# Patient Record
Sex: Male | Born: 1940 | Race: White | Hispanic: No | Marital: Married | Smoking: Former smoker
Health system: Southern US, Community
[De-identification: ages and names within clinical notes are randomized; demographics above are authoritative.]

## PROBLEM LIST (undated history)

## (undated) DIAGNOSIS — F419 Anxiety disorder, unspecified: Secondary | ICD-10-CM

## (undated) DIAGNOSIS — I10 Essential (primary) hypertension: Secondary | ICD-10-CM

## (undated) DIAGNOSIS — M1990 Unspecified osteoarthritis, unspecified site: Secondary | ICD-10-CM

## (undated) DIAGNOSIS — E781 Pure hyperglyceridemia: Secondary | ICD-10-CM

## (undated) DIAGNOSIS — I499 Cardiac arrhythmia, unspecified: Secondary | ICD-10-CM

## (undated) DIAGNOSIS — D45 Polycythemia vera: Secondary | ICD-10-CM

## (undated) DIAGNOSIS — C61 Malignant neoplasm of prostate: Secondary | ICD-10-CM

## (undated) DIAGNOSIS — F32A Depression, unspecified: Secondary | ICD-10-CM

## (undated) DIAGNOSIS — F329 Major depressive disorder, single episode, unspecified: Secondary | ICD-10-CM

## (undated) DIAGNOSIS — I219 Acute myocardial infarction, unspecified: Secondary | ICD-10-CM

## (undated) DIAGNOSIS — C801 Malignant (primary) neoplasm, unspecified: Secondary | ICD-10-CM

## (undated) HISTORY — DX: Unspecified osteoarthritis, unspecified site: M19.90

## (undated) HISTORY — DX: Acute myocardial infarction, unspecified: I21.9

## (undated) HISTORY — DX: Anxiety disorder, unspecified: F41.9

## (undated) HISTORY — PX: HERNIA REPAIR: SHX51

## (undated) HISTORY — DX: Malignant neoplasm of prostate: C61

## (undated) HISTORY — PX: PROSTATECTOMY: SHX69

---
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
## 1998-01-04 ENCOUNTER — Emergency Department (HOSPITAL_COMMUNITY): Admission: EM | Admit: 1998-01-04 | Discharge: 1998-01-04 | Payer: Self-pay | Admitting: Internal Medicine

## 1998-01-20 ENCOUNTER — Emergency Department (HOSPITAL_COMMUNITY): Admission: EM | Admit: 1998-01-20 | Discharge: 1998-01-20 | Payer: Self-pay | Admitting: Emergency Medicine

## 1998-01-31 ENCOUNTER — Emergency Department (HOSPITAL_COMMUNITY): Admission: EM | Admit: 1998-01-31 | Discharge: 1998-01-31 | Payer: Self-pay

## 2005-04-04 ENCOUNTER — Encounter: Admission: RE | Admit: 2005-04-04 | Discharge: 2005-04-04 | Payer: Self-pay | Admitting: Urology

## 2005-06-12 ENCOUNTER — Inpatient Hospital Stay (HOSPITAL_COMMUNITY): Admission: RE | Admit: 2005-06-12 | Discharge: 2005-06-14 | Payer: Self-pay | Admitting: Urology

## 2016-09-16 ENCOUNTER — Ambulatory Visit (INDEPENDENT_AMBULATORY_CARE_PROVIDER_SITE_OTHER): Payer: 59 | Admitting: Otolaryngology

## 2016-09-16 DIAGNOSIS — D3701 Neoplasm of uncertain behavior of lip: Secondary | ICD-10-CM

## 2017-04-26 DIAGNOSIS — R972 Elevated prostate specific antigen [PSA]: Secondary | ICD-10-CM | POA: Insufficient documentation

## 2019-06-18 ENCOUNTER — Emergency Department (HOSPITAL_COMMUNITY): Payer: Medicare Other

## 2019-06-18 ENCOUNTER — Other Ambulatory Visit: Payer: Self-pay

## 2019-06-18 ENCOUNTER — Encounter (HOSPITAL_COMMUNITY): Payer: Self-pay | Admitting: Emergency Medicine

## 2019-06-18 ENCOUNTER — Emergency Department (HOSPITAL_COMMUNITY)
Admission: EM | Admit: 2019-06-18 | Discharge: 2019-06-18 | Disposition: A | Discharge status: Home / Self Care | Payer: Medicare Other | Attending: Emergency Medicine | Admitting: Emergency Medicine

## 2019-06-18 DIAGNOSIS — Z79899 Other long term (current) drug therapy: Secondary | ICD-10-CM | POA: Diagnosis not present

## 2019-06-18 DIAGNOSIS — Z87891 Personal history of nicotine dependence: Secondary | ICD-10-CM | POA: Diagnosis not present

## 2019-06-18 DIAGNOSIS — I10 Essential (primary) hypertension: Secondary | ICD-10-CM | POA: Insufficient documentation

## 2019-06-18 DIAGNOSIS — Z7982 Long term (current) use of aspirin: Secondary | ICD-10-CM | POA: Diagnosis not present

## 2019-06-18 DIAGNOSIS — R040 Epistaxis: Secondary | ICD-10-CM | POA: Insufficient documentation

## 2019-06-18 DIAGNOSIS — I4891 Unspecified atrial fibrillation: Secondary | ICD-10-CM | POA: Diagnosis not present

## 2019-06-18 HISTORY — DX: Essential (primary) hypertension: I10

## 2019-06-18 HISTORY — DX: Depression, unspecified: F32.A

## 2019-06-18 HISTORY — DX: Malignant (primary) neoplasm, unspecified: C80.1

## 2019-06-18 HISTORY — DX: Pure hyperglyceridemia: E78.1

## 2019-06-18 LAB — PROTIME-INR
INR: 1.1 (ref 0.8–1.2)
Prothrombin Time: 14.2 seconds (ref 11.4–15.2)

## 2019-06-18 LAB — CBC WITH DIFFERENTIAL/PLATELET
Abs Immature Granulocytes: 0.06 10*3/uL (ref 0.00–0.07)
Basophils Absolute: 0.1 10*3/uL (ref 0.0–0.1)
Basophils Relative: 1 %
Eosinophils Absolute: 0.4 10*3/uL (ref 0.0–0.5)
Eosinophils Relative: 3 %
HCT: 45.9 % (ref 39.0–52.0)
Hemoglobin: 15.1 g/dL (ref 13.0–17.0)
Immature Granulocytes: 1 %
Lymphocytes Relative: 14 %
Lymphs Abs: 1.8 10*3/uL (ref 0.7–4.0)
MCH: 29.6 pg (ref 26.0–34.0)
MCHC: 32.9 g/dL (ref 30.0–36.0)
MCV: 90 fL (ref 80.0–100.0)
Monocytes Absolute: 0.9 10*3/uL (ref 0.1–1.0)
Monocytes Relative: 7 %
Neutro Abs: 9.6 10*3/uL — ABNORMAL HIGH (ref 1.7–7.7)
Neutrophils Relative %: 74 %
Platelets: 615 10*3/uL — ABNORMAL HIGH (ref 150–400)
RBC: 5.1 MIL/uL (ref 4.22–5.81)
RDW: 13.6 % (ref 11.5–15.5)
WBC: 12.8 10*3/uL — ABNORMAL HIGH (ref 4.0–10.5)
nRBC: 0 % (ref 0.0–0.2)

## 2019-06-18 LAB — BASIC METABOLIC PANEL
Anion gap: 7 (ref 5–15)
BUN: 27 mg/dL — ABNORMAL HIGH (ref 8–23)
CO2: 27 mmol/L (ref 22–32)
Calcium: 8.6 mg/dL — ABNORMAL LOW (ref 8.9–10.3)
Chloride: 103 mmol/L (ref 98–111)
Creatinine, Ser: 0.99 mg/dL (ref 0.61–1.24)
GFR calc Af Amer: >60 mL/min (ref >60)
GFR calc non Af Amer: >60 mL/min (ref >60)
Glucose, Bld: 104 mg/dL — ABNORMAL HIGH (ref 70–99)
Potassium: 3.8 mmol/L (ref 3.5–5.1)
Sodium: 137 mmol/L (ref 135–145)

## 2019-06-18 LAB — TROPONIN I (HIGH SENSITIVITY)
Troponin I (High Sensitivity): 4 ng/L (ref <18)
Troponin I (High Sensitivity): 5 ng/L (ref <18)

## 2019-06-18 MED ORDER — OXYMETAZOLINE HCL 0.05 % NA SOLN: 1.0000 | Freq: Once | NASAL | Status: DC

## 2019-06-18 MED ORDER — OXYMETAZOLINE HCL 0.05 % NA SOLN
1.0000 | Freq: Once | NASAL | Status: AC
  Administered 2019-06-18: 1 via NASAL
  Filled 2019-06-18: qty 30

## 2019-06-18 NOTE — Discharge Instructions (Addendum)
Continue taking her blood pressure medication and aspirin as directed.  You will need to contact the cardiologist listed to arrange follow-up regarding your atrial fibrillation.  You may use 1 to 2 sprays of the Afrin to each nostril twice daily for 3 days.  You may apply firm pressure to your nose by pinching and holding for at least 10 to 15 minutes if the bleeding returns.  If you are not unable to stop the bleeding using this method, return to the ER.

## 2019-06-18 NOTE — ED Provider Notes (Signed)
  Face-to-face evaluation   History: He complains of intermittent nosebleeding for several days.  History of same in the past.  Is also had some palpitations recently.  He denies dizziness, syncope, chest pain focal weakness or paresthesia.  Physical exam: Alert, cooperative.  Heart rate normal.  He is lucid.  No respiratory distress.  No current nasal bleeding, at 10:02 PM.   Medical screening examination/treatment/procedure(s) were conducted as a shared visit with non-physician practitioner(s) and myself.  I personally evaluated the patient during the encounter    Daleen Bo, MD 06/21/19 [phone]

## 2019-06-18 NOTE — ED Provider Notes (Signed)
[facility] EMERGENCY DEPARTMENT Provider Note   CSN: [id number] Arrival date & time: 06/18/19  1115     History   Chief Complaint Chief Complaint  Patient presents with  . Epistaxis    HPI Justin Gibbs is a 78 y.o. male.     HPI   Justin Gibbs is a 78 y.o. male with past medical history of prostate cancer and hypertension who presents to the Emergency Department complaining of intermittent episodes of left-sided epistaxis.  He states that he woke 1 week ago at 5 AM with bleeding from his left nostril.  He reports a large amount of blood persisting for 1 hour, but resolved after direct pressure with pinching his nose.  He had a similar episode the following day that also stopped with direct pressure and then again today at 930 this morning. No bleeding at present.  He also complains of intermittent episodes of "feeling like my heart is skipping" states he has had similar episodes in the past, but has not been elevated by cardiology. He denies shortness of breath, chest pain, headache, dizziness and syncope.     Past Medical History:  Diagnosis Date  . Cancer [facility])    prostate  . Depression   . High triglycerides   . Hypertension     There are no active problems to display for this patient.   Past Surgical History:  Procedure Laterality Date  . HERNIA REPAIR    . PROSTATECTOMY          Home Medications    Prior to Admission medications   Medication Sig Start Date End Date Taking? Authorizing Provider  aspirin EC 81 MG tablet Take 1 tablet by mouth daily.   Yes [provider]  lisinopril-hydrochlorothiazide (ZESTORETIC) 20-12.5 MG tablet Take 1 tablet by mouth daily. 03/09/19  Yes [provider]  metoprolol tartrate (LOPRESSOR) 50 MG tablet Take 50 mg by mouth 2 (two) times daily. 05/27/19  Yes [provider]  PARoxetine (PAXIL) 10 MG tablet Take 1 tablet by mouth daily. 05/24/19  Yes [provider]    Family  History History reviewed. No pertinent family history.  Social History Social History   Tobacco Use  . Smoking status: Former Research scientist (life sciences)  . Smokeless tobacco: Never Used  Substance Use Topics  . Alcohol use: Never    Frequency: Never  . Drug use: Never     Allergies   Patient has no known allergies.   Review of Systems Review of Systems  Constitutional: Negative for appetite change and fever.  HENT: Positive for nosebleeds. Negative for trouble swallowing.   Eyes: Negative for visual disturbance.  Respiratory: Negative for cough, chest tightness and shortness of breath.   Cardiovascular: Positive for palpitations. Negative for chest pain.  Gastrointestinal: Negative for abdominal pain, nausea and vomiting.  Musculoskeletal: Negative for arthralgias, back pain and neck pain.  Skin: Negative for rash.  Neurological: Negative for dizziness, syncope, weakness, numbness and headaches.  Hematological: Negative for adenopathy.     Physical Exam Updated Vital Signs BP (!) 153/91 (BP Location: Left Arm)   Pulse 80   Temp 98.4 F (36.9 C) (Oral)   Resp 18   Ht 5\' 10"  (1.778 m)   Wt 95.3 kg   SpO2 95%   BMI 30.13 kg/m   Physical Exam Vitals signs and nursing note reviewed.  Constitutional:      Appearance: Normal appearance. He is not ill-appearing.  HENT:     Nose:  Comments: Dried blood to the left nostril.  No active bleeding    Mouth/Throat:     Mouth: Mucous membranes are moist.     Pharynx: Oropharynx is clear.  Neck:     Musculoskeletal: Normal range of motion. No muscular tenderness.  Cardiovascular:     Rate and Rhythm: Normal rate. Rhythm irregular.     Pulses: Normal pulses.  Pulmonary:     Effort: Pulmonary effort is normal. No respiratory distress.     Breath sounds: Normal breath sounds.  Abdominal:     General: There is no distension.     Palpations: Abdomen is soft.     Tenderness: There is no abdominal tenderness.  Musculoskeletal: Normal  range of motion.     Right lower leg: No edema.     Left lower leg: No edema.  Lymphadenopathy:     Cervical: No cervical adenopathy.  Skin:    General: Skin is warm.     Capillary Refill: Capillary refill takes less than 2 seconds.     Findings: No rash.  Neurological:     General: No focal deficit present.     Mental Status: He is alert.     Sensory: No sensory deficit.     Motor: No weakness.      ED Treatments / Results  Labs (all labs ordered are listed, but only abnormal results are displayed) Labs Reviewed  BASIC METABOLIC PANEL - Abnormal; Notable for the following components:      Result Value   Glucose, Bld 104 (*)    BUN 27 (*)    Calcium 8.6 (*)    All other components within normal limits  CBC WITH DIFFERENTIAL/PLATELET - Abnormal; Notable for the following components:   WBC 12.8 (*)    Platelets 615 (*)    Neutro Abs 9.6 (*)    All other components within normal limits  PROTIME-INR  TROPONIN I (HIGH SENSITIVITY)  TROPONIN I (HIGH SENSITIVITY)    EKG EKG Interpretation  Date/Time:  Friday June 18 2019 18:11:43 EST Ventricular Rate:  69 PR Interval:    QRS Duration: 92 QT Interval:  540 QTC Calculation: 579 R Axis:   64 Text Interpretation: Atrial fibrillation Borderline repolarization abnormality Prolonged QT interval Baseline wander in lead(s) II III aVR aVF Since last tracing Atrial fibrillation has occurred and QT is longer Confirmed by Daleen Bo [phone]) on 06/19/2019 1:53:04 AM   Radiology Dg Chest Portable 1 View  Result Date: 06/18/2019 CLINICAL DATA:  Per triage Note: Patient reports 3 nosebleeds in the past few days. Started this am at 09:30. Patient states he had bleeding for approximately an hour this am. No bleeding at present. EXAM: PORTABLE CHEST 1 VIEW COMPARISON:  06/10/2005 FINDINGS: Cardiac silhouette is normal in size. No mediastinal or hilar masses. Clear lungs.  No pleural effusion or pneumothorax. Skeletal structures  are grossly intact. IMPRESSION: No active disease. Electronically Signed   By: Lajean Manes M.D.   On: 06/18/2019 19:33    Procedures Procedures (including critical care time)  Medications Ordered in ED Medications  oxymetazoline (AFRIN) 0.05 % nasal spray 1 spray (1 spray Each Nare Given 06/18/19 2106)     Initial Impression / Assessment and Plan / ED Course  I have reviewed the triage vital signs and the nursing notes.  Pertinent labs & imaging results that were available during my care of the patient were reviewed by me and considered in my medical decision making (see chart for details).  CHADS-VASc score is 3   Patient also seen by Dr. Eulis Foster and care plan discussed.  Pt with atrial fib. No RVR.  Episodic epistaxis previously w/o active bleeding. Afrin applied.  Pt has been observed here without complications.  Seen by Dr. Eulis Foster and care plan discussed.    Final Clinical Impressions(s) / ED Diagnoses   Final diagnoses:  Left-sided epistaxis  Atrial fibrillation, unspecified type [facility])    ED Discharge Orders    None       Kem Parkinson, PA-C 06/20/19 1339    Daleen Bo, MD 06/21/19 [id number]    Daleen Bo, MD 06/21/19 [phone]

## 2019-06-18 NOTE — ED Notes (Signed)
No active bleeding to nose at this time

## 2019-06-18 NOTE — ED Triage Notes (Signed)
Patient reports 3 nosebleeds in the past few days. Started this am at 09:30. Patient states he had bleeding for approximately an hour this am. No bleeding at present.

## 2019-06-18 NOTE — ED Notes (Signed)
Pt reports he has had some palpations and felt like his heart was racing in the past, radial pulse palpated and is irregular.

## 2019-07-02 ENCOUNTER — Other Ambulatory Visit: Payer: Self-pay

## 2019-07-05 ENCOUNTER — Encounter: Payer: Self-pay | Admitting: Family Medicine

## 2019-07-05 ENCOUNTER — Ambulatory Visit (INDEPENDENT_AMBULATORY_CARE_PROVIDER_SITE_OTHER): Payer: Medicare Other | Admitting: Family Medicine

## 2019-07-05 ENCOUNTER — Other Ambulatory Visit: Payer: Self-pay

## 2019-07-05 VITALS — BP 145/87 | HR 76 | Temp 98.0°F | Resp 20 | Ht 70.0 in | Wt 226.0 lb

## 2019-07-05 DIAGNOSIS — R9431 Abnormal electrocardiogram [ECG] [EKG]: Secondary | ICD-10-CM | POA: Insufficient documentation

## 2019-07-05 DIAGNOSIS — Z6832 Body mass index (BMI) 32.0-32.9, adult: Secondary | ICD-10-CM | POA: Insufficient documentation

## 2019-07-05 DIAGNOSIS — F411 Generalized anxiety disorder: Secondary | ICD-10-CM | POA: Insufficient documentation

## 2019-07-05 DIAGNOSIS — R0600 Dyspnea, unspecified: Secondary | ICD-10-CM

## 2019-07-05 DIAGNOSIS — I480 Paroxysmal atrial fibrillation: Secondary | ICD-10-CM | POA: Diagnosis not present

## 2019-07-05 DIAGNOSIS — R5383 Other fatigue: Secondary | ICD-10-CM

## 2019-07-05 DIAGNOSIS — Z23 Encounter for immunization: Secondary | ICD-10-CM

## 2019-07-05 DIAGNOSIS — Z8546 Personal history of malignant neoplasm of prostate: Secondary | ICD-10-CM | POA: Insufficient documentation

## 2019-07-05 DIAGNOSIS — E782 Mixed hyperlipidemia: Secondary | ICD-10-CM | POA: Insufficient documentation

## 2019-07-05 DIAGNOSIS — J329 Chronic sinusitis, unspecified: Secondary | ICD-10-CM | POA: Insufficient documentation

## 2019-07-05 DIAGNOSIS — R0609 Other forms of dyspnea: Secondary | ICD-10-CM

## 2019-07-05 NOTE — Progress Notes (Addendum)
Subjective:  Patient ID: Justin Gibbs, male    DOB: 15-Aug-1940, 78 y.o.   MRN: [id number]  Patient Care Team: Baruch Gouty, FNP as PCP - General (Family Medicine)   Chief Complaint:  Establish Care (New - from Dr Scotty Court in [place])   HPI: Justin Gibbs is a 78 y.o. male presenting on 07/05/2019 for Establish Care (New - from Dr Scotty Court in [place])   Pt presents today to establish care with a new PCP. He was seeing Dr. Scotty Court in [place], [place], who has retired. Pt does have a history of prostate cancer and underwent a radical prostatectomy in 2006 and is followed by urology on a regular basis. His PSA is checked every 3 months and he is seen by his urologist every 6 months. He reports he has been doing very well since his prostatectomy. He denies LUTS.  He was recently started on atorvastatin and developed side effects so it was stopped. He then started fish oil and developed recurrent nose bleeds, so he stopped this as well. States he was seen in the ED at [facility] ED on 06/18/2019 due to epistaxis. Pt states at this time he had an EKG that was abnormal. Pt states he has been told in the past he had an irregular heart beat at times but this was attributed to his anxiety. States he last saw cardiology around 1998. He does take ASA on a daily basis but has never been on any other blood thinners. His EKG in the ED revealed A Fib at a controlled rate with a prolonged QT interval. CXR was normal. Imaging and lab results available in EHR for review. Pt states he does have fatigue and DOE. He denies CP, resting SHOB, PND, or orthopnea. No lower extremity swelling or weight changes.  He reports he has had anxiety for a long time. States he was placed on paroxetine many years ago and has tolerated it well. States he tried to stop this at one time and was unable to. He denies recent panic, SI, or HI. No insomnia.   Relevant past medical, surgical, family, and social history reviewed and updated as  indicated.  Allergies and medications reviewed and updated. Date reviewed: Chart in Epic.   Past Medical History:  Diagnosis Date  . Cancer [facility])    prostate / removed 2006  . Depression   . High triglycerides   . Hypertension     Past Surgical History:  Procedure Laterality Date  . HERNIA REPAIR Bilateral    x 2   . PROSTATECTOMY      Social History   Socioeconomic History  . Marital status: Married    Spouse name: Dawn  . Number of children: 3  . Years of education: Not on file  . Highest education level: Not on file  Occupational History  . Occupation: retired     Comment: 2007  Social Needs  . Financial resource strain: Not on file  . Food insecurity    Worry: Not on file    Inability: Not on file  . Transportation needs    Medical: Not on file    Non-medical: Not on file  Tobacco Use  . Smoking status: Former Research scientist (life sciences)  . Smokeless tobacco: Never Used  Substance and Sexual Activity  . Alcohol use: Never    Frequency: Never  . Drug use: Never  . Sexual activity: Not on file  Lifestyle  . Physical  activity    Days per week: Not on file    Minutes per session: Not on file  . Stress: Not on file  Relationships  . Social Herbalist on phone: Not on file    Gets together: Not on file    Attends religious service: Not on file    Active member of club or organization: Not on file    Attends meetings of clubs or organizations: Not on file    Relationship status: Not on file  . Intimate partner violence    Fear of current or ex partner: Not on file    Emotionally abused: Not on file    Physically abused: Not on file    Forced sexual activity: Not on file  Other Topics Concern  . Not on file  Social History Narrative  . Not on file    Outpatient Encounter Medications as of 07/05/2019  Medication Sig  . aspirin EC 81 MG tablet Take 1 tablet by mouth daily.  Marland Kitchen lisinopril-hydrochlorothiazide (ZESTORETIC) 20-12.5 MG tablet Take 1 tablet by mouth  daily.  . metoprolol tartrate (LOPRESSOR) 50 MG tablet Take 50 mg by mouth 2 (two) times daily.  Marland Kitchen PARoxetine (PAXIL) 10 MG tablet Take 1 tablet by mouth daily.   No facility-administered encounter medications on file as of 07/05/2019.     No Known Allergies  Review of Systems  Constitutional: Positive for fatigue. Negative for activity change, appetite change, chills, diaphoresis, fever and unexpected weight change.  HENT: Positive for hearing loss.   Eyes: Negative.  Negative for photophobia and visual disturbance.  Respiratory: Positive for shortness of breath. Negative for cough and chest tightness.   Cardiovascular: Positive for palpitations. Negative for chest pain and leg swelling.  Gastrointestinal: Negative for abdominal distention, abdominal pain, anal bleeding, blood in stool, constipation, diarrhea, nausea, rectal pain and vomiting.  Endocrine: Negative.  Negative for cold intolerance, heat intolerance, polydipsia, polyphagia and polyuria.  Genitourinary: Negative for decreased urine volume, difficulty urinating, discharge, dysuria, frequency, penile pain, penile swelling, scrotal swelling, testicular pain and urgency.  Musculoskeletal: Negative for arthralgias, back pain and myalgias.  Skin: Negative.   Allergic/Immunologic: Negative.   Neurological: Negative for dizziness, tremors, seizures, syncope, facial asymmetry, speech difficulty, weakness, light-headedness, numbness and headaches.  Hematological: Negative.  Does not bruise/bleed easily.  Psychiatric/Behavioral: Negative for agitation, behavioral problems, confusion, decreased concentration, dysphoric mood, hallucinations, self-injury, sleep disturbance and suicidal ideas. The patient is nervous/anxious. The patient is not hyperactive.   All other systems reviewed and are negative.       Objective:  BP (!) 145/87 (BP Location: Left Arm, Cuff Size: Large)   Pulse 76   Temp 98 F (36.7 C)   Resp 20   Ht 5' 10"   (1.778 m)   Wt 226 lb (102.5 kg)   SpO2 97%   BMI 32.43 kg/m    Wt Readings from Last 3 Encounters:  07/05/19 226 lb (102.5 kg)  06/18/19 210 lb (95.3 kg)    Physical Exam Vitals signs and nursing note reviewed.  Constitutional:      General: He is not in acute distress.    Appearance: Normal appearance. He is well-developed and well-groomed. He is obese. He is not ill-appearing, toxic-appearing or diaphoretic.  HENT:     Head: Normocephalic and atraumatic.     Jaw: There is normal jaw occlusion.     Right Ear: Decreased hearing noted.     Left Ear: Decreased hearing noted.  Ears:     Comments: Bilateral hearing aids    Nose: Nose normal.     Mouth/Throat:     Lips: Pink.     Mouth: Mucous membranes are moist.     Pharynx: Oropharynx is clear. Uvula midline.  Eyes:     General: Lids are normal.     Extraocular Movements: Extraocular movements intact.     Conjunctiva/sclera: Conjunctivae normal.     Pupils: Pupils are equal, round, and reactive to light.  Neck:     Musculoskeletal: Normal range of motion and neck supple.     Thyroid: No thyroid mass, thyromegaly or thyroid tenderness.     Vascular: No carotid bruit or JVD.     Trachea: Trachea and phonation normal.  Cardiovascular:     Rate and Rhythm: Normal rate. Rhythm regularly irregular.     Chest Wall: PMI is not displaced.     Pulses: Normal pulses.     Heart sounds: Normal heart sounds. No murmur. No friction rub. No gallop. No S3 sounds.   Pulmonary:     Effort: Pulmonary effort is normal. No respiratory distress.     Breath sounds: Normal breath sounds. No stridor. No wheezing, rhonchi or rales.  Abdominal:     General: Abdomen is protuberant. Bowel sounds are normal. There is no distension or abdominal bruit.     Palpations: Abdomen is soft. There is no hepatomegaly or splenomegaly.     Tenderness: There is no abdominal tenderness. There is no right CVA tenderness or left CVA tenderness.     Hernia: No  hernia is present.  Musculoskeletal: Normal range of motion.     Right lower leg: No edema.     Left lower leg: No edema.  Lymphadenopathy:     Cervical: No cervical adenopathy.  Skin:    General: Skin is warm and dry.     Capillary Refill: Capillary refill takes less than 2 seconds.     Coloration: Skin is not cyanotic, jaundiced or pale.     Findings: No rash.  Neurological:     General: No focal deficit present.     Mental Status: He is alert and oriented to person, place, and time.     Cranial Nerves: Cranial nerves are intact. No cranial nerve deficit.     Sensory: Sensation is intact. No sensory deficit.     Motor: Motor function is intact. No weakness.     Coordination: Coordination is intact. Coordination normal.     Gait: Gait is intact. Gait normal.     Deep Tendon Reflexes: Reflexes are normal and symmetric. Reflexes normal.  Psychiatric:        Attention and Perception: Attention and perception normal.        Mood and Affect: Mood and affect normal.        Speech: Speech normal.        Behavior: Behavior normal. Behavior is cooperative.        Thought Content: Thought content normal.        Cognition and Memory: Cognition and memory normal.        Judgment: Judgment normal.     Results for orders placed or performed during the hospital encounter of 40/98/11  Basic metabolic panel  Result Value Ref Range   Sodium 137 135 - 145 mmol/L   Potassium 3.8 3.5 - 5.1 mmol/L   Chloride 103 98 - 111 mmol/L   CO2 27 22 - 32 mmol/L   Glucose, Bld 104 (  H) 70 - 99 mg/dL   BUN 27 (H) 8 - 23 mg/dL   Creatinine, Ser 0.99 0.61 - 1.24 mg/dL   Calcium 8.6 (L) 8.9 - 10.3 mg/dL   GFR calc non Af Amer >60 >60 mL/min   GFR calc Af Amer >60 >60 mL/min   Anion gap 7 5 - 15  CBC with Differential  Result Value Ref Range   WBC 12.8 (H) 4.0 - 10.5 K/uL   RBC 5.10 4.22 - 5.81 MIL/uL   Hemoglobin 15.1 13.0 - 17.0 g/dL   HCT 45.9 39.0 - 52.0 %   MCV 90.0 80.0 - 100.0 fL   MCH 29.6 26.0  - 34.0 pg   MCHC 32.9 30.0 - 36.0 g/dL   RDW 13.6 11.5 - 15.5 %   Platelets 615 (H) 150 - 400 K/uL   nRBC 0.0 0.0 - 0.2 %   Neutrophils Relative % 74 %   Neutro Abs 9.6 (H) 1.7 - 7.7 K/uL   Lymphocytes Relative 14 %   Lymphs Abs 1.8 0.7 - 4.0 K/uL   Monocytes Relative 7 %   Monocytes Absolute 0.9 0.1 - 1.0 K/uL   Eosinophils Relative 3 %   Eosinophils Absolute 0.4 0.0 - 0.5 K/uL   Basophils Relative 1 %   Basophils Absolute 0.1 0.0 - 0.1 K/uL   Immature Granulocytes 1 %   Abs Immature Granulocytes 0.06 0.00 - 0.07 K/uL  Protime-INR  Result Value Ref Range   Prothrombin Time 14.2 11.4 - 15.2 seconds   INR 1.1 0.8 - 1.2  Troponin I (High Sensitivity)  Result Value Ref Range   Troponin I (High Sensitivity) 4 <18 ng/L  Troponin I (High Sensitivity)  Result Value Ref Range   Troponin I (High Sensitivity) 5 <18 ng/L       Pertinent labs & imaging results that were available during my care of the patient were reviewed by me and considered in my medical decision making.  Assessment & Plan:  [place] was seen today for establish care.  Diagnoses and all orders for this visit:  History of prostate cancer Followed by urology on a regular basis. Has upcoming appointment in January.  -     CBC with Differential/Platelet  Paroxysmal atrial fibrillation ([place]) Recent EKG in ED revealed A Fib at a controlled rate. No previous EKG available for review in EHR. Pt unsure of prior A Fib diagnosis. Was seen by cardiology around 1998 and has not followed up since. On ASA daily. Recent recurrent epistaxis so will not initiate additional anticoagulation, will refer to cardiology for evaluation.  -     CMP14+EGFR -     Thyroid Panel With TSH -     Ambulatory referral to Cardiology  Mixed hyperlipidemia Unable to tolerate fish oil or statin therapy. Will initiate Red Yeast Rice, diet, and exercise. Labs pending.  -     Lipid panel -     Ambulatory referral to Cardiology  BMI 32.0-32.9,adult  Diet and exercise encouraged. Labs pending.  -     CMP14+EGFR -     CBC with Differential/Platelet -     Lipid panel -     Thyroid Panel With TSH  GAD (generalized anxiety disorder) Doing well on current therapy, will continue. Labs pending.  -     Thyroid Panel With TSH  DOE (dyspnea on exertion) CXR in ED normal, EKG in ED revealed A Fib. Will refer to cardiology for evaluation.  -     Ambulatory referral  to Cardiology  Other fatigue Labs pending.  -     CMP14+EGFR -     CBC with Differential/Platelet -     Thyroid Panel With TSH -     Ambulatory referral to Cardiology  Prolonged Q-T interval on ECG -     Ambulatory referral to Cardiology  PNA 13 vaccine given today.    Total time spent with patient 60 minutes.  Greater than 50% of encounter spent in coordination of care/counseling.   Continue all other maintenance medications.  Follow up plan: Return if symptoms worsen or fail to improve.  Continue healthy lifestyle choices, including diet (rich in fruits, vegetables, and lean proteins, and low in salt and simple carbohydrates) and exercise (at least 30 minutes of moderate physical activity daily).  Educational handout given for A Fib  The above assessment and management plan was discussed with the patient. The patient verbalized understanding of and has agreed to the management plan. Patient is aware to call the clinic if they develop any new symptoms or if symptoms persist or worsen. Patient is aware when to return to the clinic for a follow-up visit. Patient educated on when it is appropriate to go to the emergency department.   Monia Pouch, FNP-C [facility] [phone]

## 2019-07-05 NOTE — Patient Instructions (Addendum)
Red Yeast Rice 2400 mg daily, over the counter.   Atrial Fibrillation  Atrial fibrillation is a type of heartbeat that is irregular or fast (rapid). If you have this condition, your heart beats without any order. This makes it hard for your heart to pump blood in a normal way. Having this condition gives you more risk for stroke, heart failure, and other heart problems. Atrial fibrillation may start all of a sudden and then stop on its own, or it may become a long-lasting problem. What are the causes? This condition may be caused by heart conditions, such as:  High blood pressure.  Heart failure.  Heart valve disease.  Heart surgery. Other causes include:  Pneumonia.  Obstructive sleep apnea.  Lung cancer.  Thyroid disease.  Drinking too much alcohol. Sometimes the cause is not known. What increases the risk? You are more likely to develop this condition if:  You smoke.  You are older.  You have diabetes.  You are overweight.  You have a family history of this condition.  You exercise often and hard. What are the signs or symptoms? Common symptoms of this condition include:  A feeling like your heart is beating very fast.  Chest pain.  Feeling short of breath.  Feeling light-headed or weak.  Getting tired easily. Follow these instructions at home: Medicines  Take over-the-counter and prescription medicines only as told by your doctor.  If your doctor gives you a blood-thinning medicine, take it exactly as told. Taking too much of it can cause bleeding. Taking too little of it does not protect you against clots. Clots can cause a stroke. Lifestyle      Do not use any tobacco products. These include cigarettes, chewing tobacco, and e-cigarettes. If you need help quitting, ask your doctor.  Do not drink alcohol.  Do not drink beverages that have caffeine. These include coffee, soda, and tea.  Follow diet instructions as told by your  doctor.  Exercise regularly as told by your doctor. General instructions  If you have a condition that causes breathing to stop for a short period of time (apnea), treat it as told by your doctor.  Keep a healthy weight. Do not use diet pills unless your doctor says they are safe for you. Diet pills may make heart problems worse.  Keep all follow-up visits as told by your doctor. This is important. Contact a doctor if:  You notice a change in the speed, rhythm, or strength of your heartbeat.  You are taking a blood-thinning medicine and you see more bruising.  You get tired more easily when you move or exercise.  You have a sudden change in weight. Get help right away if:   You have pain in your chest or your belly (abdomen).  You have trouble breathing.  You have blood in your vomit, poop, or pee (urine).  You have any signs of a stroke. "BE FAST" is an easy way to remember the main warning signs: ? B - Balance. Signs are dizziness, sudden trouble walking, or loss of balance. ? E - Eyes. Signs are trouble seeing or a change in how you see. ? F - Face. Signs are sudden weakness or loss of feeling in the face, or the face or eyelid drooping on one side. ? A - Arms. Signs are weakness or loss of feeling in an arm. This happens suddenly and usually on one side of the body. ? S - Speech. Signs are sudden trouble speaking, slurred  speech, or trouble understanding what people say. ? T - Time. Time to call emergency services. Write down what time symptoms started.  You have other signs of a stroke, such as: ? A sudden, very bad headache with no known cause. ? Feeling sick to your stomach (nausea). ? Throwing up (vomiting). ? Jerky movements you cannot control (seizure). These symptoms may be an emergency. Do not wait to see if the symptoms will go away. Get medical help right away. Call your local emergency services (911 in the [place] Do not drive yourself to the  hospital. Summary  Atrial fibrillation is a type of heartbeat that is irregular or fast (rapid).  You are at higher risk of this condition if you smoke, are older, have diabetes, or are overweight.  Follow your doctor's instructions about medicines, diet, exercise, and follow-up visits.  Get help right away if you think that you have signs of a stroke. This information is not intended to replace advice given to you by your health care provider. Make sure you discuss any questions you have with your health care provider. Document Released: 04/23/2008 Document Revised: 09/18/2017 Document Reviewed: 09/05/2017 Elsevier Patient Education  2020 [employer].

## 2019-07-06 DIAGNOSIS — Z7189 Other specified counseling: Secondary | ICD-10-CM | POA: Insufficient documentation

## 2019-07-06 DIAGNOSIS — I10 Essential (primary) hypertension: Secondary | ICD-10-CM | POA: Insufficient documentation

## 2019-07-06 LAB — CBC WITH DIFFERENTIAL/PLATELET
Basophils Absolute: 0.1 10*3/uL (ref 0.0–0.2)
Basos: 1 %
EOS (ABSOLUTE): 0.5 10*3/uL — ABNORMAL HIGH (ref 0.0–0.4)
Eos: 5 %
Hematocrit: 47.1 % (ref 37.5–51.0)
Hemoglobin: 15.8 g/dL (ref 13.0–17.7)
Immature Grans (Abs): 0 10*3/uL (ref 0.0–0.1)
Immature Granulocytes: 0 %
Lymphocytes Absolute: 1.9 10*3/uL (ref 0.7–3.1)
Lymphs: 18 %
MCH: 29.3 pg (ref 26.6–33.0)
MCHC: 33.5 g/dL (ref 31.5–35.7)
MCV: 87 fL (ref 79–97)
Monocytes Absolute: 0.8 10*3/uL (ref 0.1–0.9)
Monocytes: 7 %
Neutrophils Absolute: 7.3 10*3/uL — ABNORMAL HIGH (ref 1.4–7.0)
Neutrophils: 69 %
Platelets: 644 10*3/uL — ABNORMAL HIGH (ref 150–450)
RBC: 5.4 x10E6/uL (ref 4.14–5.80)
RDW: 13.1 % (ref 11.6–15.4)
WBC: 10.5 10*3/uL (ref 3.4–10.8)

## 2019-07-06 LAB — CMP14+EGFR
ALT: 19 IU/L (ref 0–44)
AST: 18 IU/L (ref 0–40)
Albumin/Globulin Ratio: 1.5 (ref 1.2–2.2)
Albumin: 4.3 g/dL (ref 3.7–4.7)
Alkaline Phosphatase: 62 IU/L (ref 39–117)
BUN/Creatinine Ratio: 18 (ref 10–24)
BUN: 28 mg/dL — ABNORMAL HIGH (ref 8–27)
Bilirubin Total: 0.5 mg/dL (ref 0.0–1.2)
CO2: 26 mmol/L (ref 20–29)
Calcium: 9.4 mg/dL (ref 8.6–10.2)
Chloride: 98 mmol/L (ref 96–106)
Creatinine, Ser: 1.58 mg/dL — ABNORMAL HIGH (ref 0.76–1.27)
GFR calc Af Amer: 48 mL/min/{1.73_m2} — ABNORMAL LOW (ref >59)
GFR calc non Af Amer: 42 mL/min/{1.73_m2} — ABNORMAL LOW (ref >59)
Globulin, Total: 2.9 g/dL (ref 1.5–4.5)
Glucose: 106 mg/dL — ABNORMAL HIGH (ref 65–99)
Potassium: 4.9 mmol/L (ref 3.5–5.2)
Sodium: 138 mmol/L (ref 134–144)
Total Protein: 7.2 g/dL (ref 6.0–8.5)

## 2019-07-06 LAB — LIPID PANEL
Chol/HDL Ratio: 4.4 ratio (ref 0.0–5.0)
Cholesterol, Total: 157 mg/dL (ref 100–199)
HDL: 36 mg/dL — ABNORMAL LOW (ref >39)
LDL Chol Calc (NIH): 95 mg/dL (ref 0–99)
Triglycerides: 145 mg/dL (ref 0–149)
VLDL Cholesterol Cal: 26 mg/dL (ref 5–40)

## 2019-07-06 LAB — THYROID PANEL WITH TSH
Free Thyroxine Index: 1.9 (ref 1.2–4.9)
T3 Uptake Ratio: 26 % (ref 24–39)
T4, Total: 7.2 ug/dL (ref 4.5–12.0)
TSH: 4.05 u[IU]/mL (ref 0.450–4.500)

## 2019-07-06 NOTE — Progress Notes (Signed)
Cardiology Office Note   Date:  07/07/2019   ID:  Justin Gibbs, DOB 1941-06-22, MRN [id number]  PCP:  Baruch Gouty, FNP  Cardiologist:   No primary care provider on file. Referring:  Baruch Gouty, FNP  Chief Complaint  Patient presents with  . Atrial Fibrillation      History of Present Illness: Justin Gibbs is a 78 y.o. male who is referred for evaluation of atrial fib.   He was in the ED with epistaxis in late November.  I reviewed these records for this visit.  The patient went to the emergency room because his nose was bleeding.  Turns out he had this a couple of days a week before he presented.  He had been on fish oil so he stopped taking that.  However, he took a spray of Afrin about 6 or 7 days after taking the last fish oil and he had a nosebleed again.  He has not had any since then.  But he is off all of those medications.  He was noted in the emergency room to be in atrial fibrillation.  He feels maybe a flutter once in a while but he really does not notice this.  He thinks he has had it since he was a kid.  He is not really had any other cardiac issues.  He has been getting short of breath for about a year walking 800 feet.  He is not describing PND or orthopnea.  He has not had any presyncope or syncope.  He has some mild lower extremity swelling.  He does not have any chest pressure, neck or arm discomfort.  I do note that his thyroid was recently normal.  Blood counts were normal.  His creatinine was mildly elevated which was new.  Looking at the ED records I do not see that they mention while he had his nosebleed.  Past Medical History:  Diagnosis Date  . Cancer [facility])    prostate / removed 2006  . Depression   . High triglycerides   . Hypertension     Past Surgical History:  Procedure Laterality Date  . HERNIA REPAIR Bilateral    x 2   . PROSTATECTOMY       Current Outpatient Medications  Medication Sig Dispense Refill  . aspirin EC 81 MG tablet  Take 1 tablet by mouth daily.    Marland Kitchen lisinopril-hydrochlorothiazide (ZESTORETIC) 20-12.5 MG tablet Take 1 tablet by mouth daily.    . metoprolol tartrate (LOPRESSOR) 50 MG tablet Take 50 mg by mouth 2 (two) times daily.    . Multiple Vitamin (MULTIVITAMIN) capsule Take 1 capsule by mouth daily.    Marland Kitchen PARoxetine (PAXIL) 10 MG tablet Take 1 tablet by mouth daily.     No current facility-administered medications for this visit.     Allergies:   Patient has no known allergies.    Social History:  The patient  reports that he has quit smoking. He has never used smokeless tobacco. He reports that he does not drink alcohol or use drugs.   Family History:  The patient's family history includes Alcohol abuse in his father; Alzheimer's disease in his mother; Cancer in his father; Cirrhosis in his father; Diabetes in his daughter; Stroke in his brother.    ROS:  Please see the history of present illness.   Otherwise, review of systems are positive for none.   All other systems are reviewed and negative.    PHYSICAL  EXAM: VS:  BP (!) 152/90   Pulse 68   Ht 5\' 9"  (1.753 m)   Wt 227 lb (103 kg)   BMI 33.52 kg/m  , BMI Body mass index is 33.52 kg/m. GENERAL:  Well appearing HEENT:  Pupils equal round and reactive, fundi not visualized, oral mucosa unremarkable NECK:  No jugular venous distention, waveform within normal limits, carotid upstroke brisk and symmetric, no bruits, no thyromegaly LYMPHATICS:  No cervical, inguinal adenopathy LUNGS:  Clear to auscultation bilaterally BACK:  No CVA tenderness CHEST:  Unremarkable HEART:  PMI not displaced or sustained,S1 and S2 within normal limits, no S3, no clicks, no rubs, no murmurs, irregular ABD:  Flat, positive bowel sounds normal in frequency in pitch, no bruits, no rebound, no guarding, no midline pulsatile mass, no hepatomegaly, no splenomegaly EXT:  2 plus pulses throughout, mild edema, no cyanosis no clubbing SKIN:  No rashes no nodules  NEURO:  Cranial nerves II through XII grossly intact, motor grossly intact throughout PSYCH:  Cognitively intact, oriented to person place and time    EKG:  EKG is not ordered today. The ekg ordered 06/18/2019 demonstrates atrial fibrillation, rate 69, axis within normal limits, intervals within normal limits, no acute ST-T wave changes.   Recent Labs: 07/05/2019: ALT 19; BUN 28; Creatinine, Ser 1.58; Hemoglobin 15.8; Platelets 644; Potassium 4.9; Sodium 138; TSH 4.050    Lipid Panel    Component Value Date/Time   CHOL 157 07/05/2019 1646   TRIG 145 07/05/2019 1646   HDL 36 (L) 07/05/2019 1646   CHOLHDL 4.4 07/05/2019 1646   LDLCALC 95 07/05/2019 1646      Wt Readings from Last 3 Encounters:  07/07/19 227 lb (103 kg)  07/05/19 226 lb (102.5 kg)  06/18/19 210 lb (95.3 kg)      Other studies Reviewed: Additional studies/ records that were reviewed today include: ED records. Review of the above records demonstrates:  Please see elsewhere in the note.     ASSESSMENT AND PLAN:  ATRIAL FIB:   This is new onset.  He does not feel this.  He would have an indication for anticoagulation Mr. [name] has a CHA2DS2 - VASc score of 3.  However, I cannot start this until I understand why his nose has been bleeding.  It may be related to the meds he was taking but I would like him to see ENT.  After he sees them I would bring him back and discuss DOAC.  He needs to have a family member here because he really does not understand clearly what I am talking about.  I would also like him to wear a 3-day Holter.  We will get an echocardiogram to evaluate this and his shortness of breath.  For now he can continue the meds as listed.  HTN: I like him to keep a blood pressure diary.  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  Will have to have his creatinine checked.  I will defer to his primary provider or will follow up with a basic metabolic profile if he has not had one he comes back to see me.    Current medicines are reviewed at length with the patient today.  The patient does not have concerns regarding medicines.  The following changes have been made:  no change  Labs/ tests ordered today include:   Orders Placed This Encounter  Procedures  . Ambulatory referral to ENT  . LONG TERM MONITOR (3-14 DAYS)  .  ECHOCARDIOGRAM COMPLETE     Disposition:   FU with me after the ENT appt.     Signed, Minus Breeding, MD  07/07/2019 11:33 AM    [facility]

## 2019-07-07 ENCOUNTER — Other Ambulatory Visit: Payer: Self-pay

## 2019-07-07 ENCOUNTER — Encounter: Payer: Self-pay | Admitting: Cardiology

## 2019-07-07 ENCOUNTER — Other Ambulatory Visit: Payer: Medicare Other

## 2019-07-07 ENCOUNTER — Other Ambulatory Visit: Payer: Self-pay | Admitting: Family Medicine

## 2019-07-07 ENCOUNTER — Ambulatory Visit: Payer: Medicare Other | Admitting: Cardiology

## 2019-07-07 ENCOUNTER — Telehealth: Payer: Self-pay | Admitting: Radiology

## 2019-07-07 VITALS — BP 152/90 | HR 68 | Ht 69.0 in | Wt 227.0 lb

## 2019-07-07 DIAGNOSIS — I129 Hypertensive chronic kidney disease with stage 1 through stage 4 chronic kidney disease, or unspecified chronic kidney disease: Secondary | ICD-10-CM

## 2019-07-07 DIAGNOSIS — R7989 Other specified abnormal findings of blood chemistry: Secondary | ICD-10-CM

## 2019-07-07 DIAGNOSIS — R040 Epistaxis: Secondary | ICD-10-CM

## 2019-07-07 DIAGNOSIS — I10 Essential (primary) hypertension: Secondary | ICD-10-CM

## 2019-07-07 DIAGNOSIS — Z7189 Other specified counseling: Secondary | ICD-10-CM

## 2019-07-07 DIAGNOSIS — N183 Chronic kidney disease, stage 3 unspecified: Secondary | ICD-10-CM | POA: Diagnosis not present

## 2019-07-07 DIAGNOSIS — I4891 Unspecified atrial fibrillation: Secondary | ICD-10-CM

## 2019-07-07 NOTE — Telephone Encounter (Signed)
Enrolled patient for a 3 day Zio monitor to be mailed to patients home.  

## 2019-07-07 NOTE — Patient Instructions (Addendum)
Medication Instructions:  The current medical regimen is effective;  continue present plan and medications.  *If you need a refill on your cardiac medications before your next appointment, please call your pharmacy*  Testing/Procedures: Your physician has requested that you have an echocardiogram. Echocardiography is a painless test that uses sound waves to create images of your heart. It provides your doctor with information about the size and shape of your heart and how well your heart's chambers and valves are working. This procedure takes approximately one hour. There are no restrictions for this procedure.  Please check in at the Main Entrance of [facility] Monitor Instructions   Your physician has requested you wear your ZIO patch monitor 3 days.   This is a single patch monitor.  [employer] supplies one patch monitor per enrollment.  Additional stickers are not available.   Please do not apply patch if you will be having a Nuclear Stress Test, Echocardiogram, Cardiac CT, MRI, or Chest Xray during the time frame you would be wearing the monitor. The patch cannot be worn during these tests.  You cannot remove and re-apply the ZIO XT patch monitor.   Your ZIO patch monitor will be sent USPS Priority mail from [facility] directly to your home address. The monitor may also be mailed to a PO BOX if home delivery is not available.   It may take 3-5 days to receive your monitor after you have been enrolled.   Once you have received you monitor, please review enclosed instructions.  Your monitor has already been registered assigning a specific monitor serial # to you.   Applying the monitor   Shave hair from upper left chest.   Hold abrader disc by orange tab.  Rub abrader in 40 strokes over left upper chest as indicated in your monitor instructions.   Clean area with 4 enclosed alcohol pads .  Use all pads to assure are is cleaned thoroughly.  Let dry.    Apply patch as indicated in monitor instructions.  Patch will be place under collarbone on left side of chest with arrow pointing upward.   Rub patch adhesive wings for 2 minutes.Remove white label marked "1".  Remove white label marked "2".  Rub patch adhesive wings for 2 additional minutes.   While looking in a mirror, press and release button in center of patch.  A small green light will flash 3-4 times .  This will be your only indicator the monitor has been turned on.     Do not shower for the first 24 hours.  You may shower after the first 24 hours.   Press button if you feel a symptom. You will hear a small click.  Record Date, Time and Symptom in the Patient Log Book.   When you are ready to remove patch, follow instructions on last 2 pages of Patient Log Book.  Stick patch monitor onto last page of Patient Log Book.   Place Patient Log Book in [place] box.  Use locking tab on box and tape box closed securely.  The [employer] and [employer] has [employer] on it.  Please place in mailbox as soon as possible.  Your physician should have your test results approximately 7 days after the monitor has been mailed back to [facility].   Call [facility] at [phone] if you have questions regarding your ZIO XT patch monitor.  Call them immediately if you see an  orange light blinking on your monitor.   If your monitor falls off in less than 4 days contact our Monitor department at [phone].  If your monitor becomes loose or falls off after 4 days call [employer] at [phone] for suggestions on securing your monitor.   Follow-Up: Follow up with Dr Percival Spanish after the above testing.  Please bring your wife with you to the appointment.  You have been referred to an Ear, Nose and Throat doctor to evaluate you for nosebleeds.  Thank you for choosing [facility]!!     Atrial Fibrillation  Atrial fibrillation is a type of heartbeat that is  irregular or fast (rapid). If you have this condition, your heart beats without any order. This makes it hard for your heart to pump blood in a normal way. Having this condition gives you more risk for stroke, heart failure, and other heart problems. Atrial fibrillation may start all of a sudden and then stop on its own, or it may become a long-lasting problem. What are the causes? This condition may be caused by heart conditions, such as:  High blood pressure.  Heart failure.  Heart valve disease.  Heart surgery. Other causes include:  Pneumonia.  Obstructive sleep apnea.  Lung cancer.  Thyroid disease.  Drinking too much alcohol. Sometimes the cause is not known. What increases the risk? You are more likely to develop this condition if:  You smoke.  You are older.  You have diabetes.  You are overweight.  You have a family history of this condition.  You exercise often and hard. What are the signs or symptoms? Common symptoms of this condition include:  A feeling like your heart is beating very fast.  Chest pain.  Feeling short of breath.  Feeling light-headed or weak.  Getting tired easily. Follow these instructions at home: Medicines  Take over-the-counter and prescription medicines only as told by your doctor.  If your doctor gives you a blood-thinning medicine, take it exactly as told. Taking too much of it can cause bleeding. Taking too little of it does not protect you against clots. Clots can cause a stroke. Lifestyle      Do not use any tobacco products. These include cigarettes, chewing tobacco, and e-cigarettes. If you need help quitting, ask your doctor.  Do not drink alcohol.  Do not drink beverages that have caffeine. These include coffee, soda, and tea.  Follow diet instructions as told by your doctor.  Exercise regularly as told by your doctor. General instructions  If you have a condition that causes breathing to stop for a  short period of time (apnea), treat it as told by your doctor.  Keep a healthy weight. Do not use diet pills unless your doctor says they are safe for you. Diet pills may make heart problems worse.  Keep all follow-up visits as told by your doctor. This is important. Contact a doctor if:  You notice a change in the speed, rhythm, or strength of your heartbeat.  You are taking a blood-thinning medicine and you see more bruising.  You get tired more easily when you move or exercise.  You have a sudden change in weight. Get help right away if:   You have pain in your chest or your belly (abdomen).  You have trouble breathing.  You have blood in your vomit, poop, or pee (urine).  You have any signs of a stroke. "BE FAST" is an easy way to remember the main warning signs: ?  B - Balance. Signs are dizziness, sudden trouble walking, or loss of balance. ? E - Eyes. Signs are trouble seeing or a change in how you see. ? F - Face. Signs are sudden weakness or loss of feeling in the face, or the face or eyelid drooping on one side. ? A - Arms. Signs are weakness or loss of feeling in an arm. This happens suddenly and usually on one side of the body. ? S - Speech. Signs are sudden trouble speaking, slurred speech, or trouble understanding what people say. ? T - Time. Time to call emergency services. Write down what time symptoms started.  You have other signs of a stroke, such as: ? A sudden, very bad headache with no known cause. ? Feeling sick to your stomach (nausea). ? Throwing up (vomiting). ? Jerky movements you cannot control (seizure). These symptoms may be an emergency. Do not wait to see if the symptoms will go away. Get medical help right away. Call your local emergency services (911 in the [place] Do not drive yourself to the hospital. Summary  Atrial fibrillation is a type of heartbeat that is irregular or fast (rapid).  You are at higher risk of this condition if you smoke,  are older, have diabetes, or are overweight.  Follow your doctor's instructions about medicines, diet, exercise, and follow-up visits.  Get help right away if you think that you have signs of a stroke. This information is not intended to replace advice given to you by your health care provider. Make sure you discuss any questions you have with your health care provider. Document Released: 04/23/2008 Document Revised: 09/18/2017 Document Reviewed: 09/05/2017 Elsevier Patient Education  2020 [employer].

## 2019-07-08 LAB — BMP8+EGFR
BUN/Creatinine Ratio: 24 (ref 10–24)
BUN: 27 mg/dL (ref 8–27)
CO2: 26 mmol/L (ref 20–29)
Calcium: 10.2 mg/dL (ref 8.6–10.2)
Chloride: 101 mmol/L (ref 96–106)
Creatinine, Ser: 1.12 mg/dL (ref 0.76–1.27)
GFR calc Af Amer: 73 mL/min/{1.73_m2} (ref >59)
GFR calc non Af Amer: 63 mL/min/{1.73_m2} (ref >59)
Glucose: 98 mg/dL (ref 65–99)
Potassium: 5 mmol/L (ref 3.5–5.2)
Sodium: 142 mmol/L (ref 134–144)

## 2019-07-08 NOTE — Progress Notes (Signed)
Renal function has returned to normal. Keep blood sugar and blood pressure controlled and drink plenty of water as discussed.

## 2019-07-09 ENCOUNTER — Ambulatory Visit (HOSPITAL_COMMUNITY)
Admission: RE | Admit: 2019-07-09 | Discharge: 2019-07-09 | Disposition: A | Discharge status: Home / Self Care | Payer: Medicare Other | Source: Ambulatory Visit | Attending: Cardiology | Admitting: Cardiology

## 2019-07-09 ENCOUNTER — Other Ambulatory Visit (INDEPENDENT_AMBULATORY_CARE_PROVIDER_SITE_OTHER): Payer: Medicare Other

## 2019-07-09 ENCOUNTER — Other Ambulatory Visit: Payer: Self-pay

## 2019-07-09 DIAGNOSIS — I4891 Unspecified atrial fibrillation: Secondary | ICD-10-CM | POA: Insufficient documentation

## 2019-07-09 DIAGNOSIS — I10 Essential (primary) hypertension: Secondary | ICD-10-CM | POA: Diagnosis not present

## 2019-07-09 NOTE — Progress Notes (Signed)
*  PRELIMINARY RESULTS* Echocardiogram 2D Echocardiogram has been performed.  Samuel Germany 07/09/2019, 10:16 AM

## 2019-07-15 DIAGNOSIS — I4891 Unspecified atrial fibrillation: Secondary | ICD-10-CM | POA: Diagnosis not present

## 2019-07-21 DIAGNOSIS — R040 Epistaxis: Secondary | ICD-10-CM | POA: Diagnosis not present

## 2019-08-02 DIAGNOSIS — Z87898 Personal history of other specified conditions: Secondary | ICD-10-CM | POA: Diagnosis not present

## 2019-08-16 DIAGNOSIS — I4819 Other persistent atrial fibrillation: Secondary | ICD-10-CM | POA: Insufficient documentation

## 2019-08-16 DIAGNOSIS — N179 Acute kidney failure, unspecified: Secondary | ICD-10-CM | POA: Insufficient documentation

## 2019-08-16 NOTE — Progress Notes (Signed)
Cardiology Office Note   Date:  08/18/2019   ID:  Justin Gibbs, Justin Gibbs 05-28-1941, MRN [id number]  PCP:  Baruch Gouty, FNP  Cardiologist:   Minus Breeding, MD Referring:  Baruch Gouty, FNP  Chief Complaint  Patient presents with  . Atrial Fibrillation      History of Present Illness: Justin Gibbs is a 79 y.o. male who is referred for evaluation of atrial fib.   He was in the ED with epistaxis in late November. The patient went to the emergency room because his nose was bleeding.  Turns out he had this a couple of days a week before he presented.  He had been on fish oil so he stopped taking that.  However, he took a spray of Afrin about 6 or 7 days after taking the last fish oil and he had a nosebleed again.  He has not had any since then.  But he is off all of those medications.  He was noted in the emergency room to be in atrial fibrillation.    At the last visit I did not start DOAC because of the nose bleed.  He had an echo with NL EF and LAE.  He had mild TR.  Monitor demonstrated good rate control.     He did see ENT and they cauterized the bleeding site in his nose.  He thinks he got permission to start a blood thinner although I do not see that note.  He has had no further nose bleeding.  He denies any new symptoms such as presyncope or syncope.  He did have a rate of 67 with his fibrillation with occasional 3-second pauses in the resting more nighttime hours.  He occasionally gets some tachypalpitations but he thinks this is what he is excited and its not particularly problematic or long-lasting.  He does have some dyspnea with exertion but this is chronic.  He is not having any PND or orthopnea.  Past Medical History:  Diagnosis Date  . Cancer [facility])    prostate / removed 2006  . Depression   . High triglycerides   . Hypertension     Past Surgical History:  Procedure Laterality Date  . HERNIA REPAIR Bilateral    x 2   . PROSTATECTOMY       Current Outpatient  Medications  Medication Sig Dispense Refill  . amoxicillin (AMOXIL) 500 MG capsule Take 1,000 mg by mouth 2 (two) times daily.    Marland Kitchen lisinopril-hydrochlorothiazide (ZESTORETIC) 20-12.5 MG tablet Take 1 tablet by mouth daily.    . metoprolol tartrate (LOPRESSOR) 50 MG tablet Take 50 mg by mouth 2 (two) times daily.    . Multiple Vitamin (MULTIVITAMIN) capsule Take 1 capsule by mouth daily.    Marland Kitchen PARoxetine (PAXIL) 10 MG tablet 10 mg. Take 1/2 tablet twice daily    . apixaban (ELIQUIS) 5 MG TABS tablet Take 1 tablet (5 mg total) by mouth 2 (two) times daily. 60 tablet 11   No current facility-administered medications for this visit.    Allergies:   Patient has no known allergies.    ROS:  Please see the history of present illness.   Otherwise, review of systems are positive for none.   All other systems are reviewed and negative.    PHYSICAL EXAM: VS:  BP (!) 160/100   Pulse 60   Ht 5\' 9"  (1.753 m)   Wt 220 lb (99.8 kg)   BMI 32.49 kg/m  ,  BMI Body mass index is 32.49 kg/m. GENERAL:  Well appearing NECK:  No jugular venous distention, waveform within normal limits, carotid upstroke brisk and symmetric, no bruits, no thyromegaly LUNGS:  Clear to auscultation bilaterally CHEST:  Unremarkable HEART:  PMI not displaced or sustained,S1 and S2 within normal limits, no S3, no clicks, no rubs, no murmurs, irregular ABD:  Flat, positive bowel sounds normal in frequency in pitch, no bruits, no rebound, no guarding, no midline pulsatile mass, no hepatomegaly, no splenomegaly EXT:  2 plus pulses throughout, no edema, no cyanosis no clubbing   EKG:  EKG is not ordered today.   Recent Labs: 07/05/2019: ALT 19; Hemoglobin 15.8; Platelets 644; TSH 4.050 07/07/2019: BUN 27; Creatinine, Ser 1.12; Potassium 5.0; Sodium 142    Lipid Panel    Component Value Date/Time   CHOL 157 07/05/2019 1646   TRIG 145 07/05/2019 1646   HDL 36 (L) 07/05/2019 1646   CHOLHDL 4.4 07/05/2019 1646   LDLCALC 95  07/05/2019 1646      Wt Readings from Last 3 Encounters:  08/18/19 220 lb (99.8 kg)  07/07/19 227 lb (103 kg)  07/05/19 226 lb (102.5 kg)      Other studies Reviewed: Additional studies/ records that were reviewed today include: None Review of the above records demonstrates:  NA   ASSESSMENT AND PLAN:  ATRIAL FIB:    [name] has a CHA2DS2 - VASc score of 3.   I had a long discussion with the patient and his wife about the risks benefits of anticoagulation.  He understands the suggestion is to start [place].  He agrees to take Eliquis.  I will stop the ASA.  He will get a CBC in 1 month.   HTN:    He thinks he has whitecoat hypertension.  He did not keep a blood pressure diary as I suggested but we will do this.   AKI:  His creat was elevated but came down to normal.   No change in therapy.   COVID EDUCATION:   He is going to sign up for the Covid vaccine.   Current medicines are reviewed at length with the patient today.  The patient does not have concerns regarding medicines.  The following changes have been made:  no change  Labs/ tests ordered today include:   Orders Placed This Encounter  Procedures  . CBC     Disposition:   FU with me after the ENT appt.     Signed, Minus Breeding, MD  08/18/2019 12:39 PM    [facility]

## 2019-08-18 ENCOUNTER — Encounter: Payer: Self-pay | Admitting: Cardiology

## 2019-08-18 ENCOUNTER — Other Ambulatory Visit: Payer: Self-pay

## 2019-08-18 ENCOUNTER — Ambulatory Visit: Payer: Medicare Other | Admitting: Cardiology

## 2019-08-18 VITALS — BP 160/100 | HR 60 | Ht 69.0 in | Wt 220.0 lb

## 2019-08-18 DIAGNOSIS — I4891 Unspecified atrial fibrillation: Secondary | ICD-10-CM | POA: Diagnosis not present

## 2019-08-18 DIAGNOSIS — I10 Essential (primary) hypertension: Secondary | ICD-10-CM | POA: Diagnosis not present

## 2019-08-18 DIAGNOSIS — Z79899 Other long term (current) drug therapy: Secondary | ICD-10-CM

## 2019-08-18 DIAGNOSIS — N179 Acute kidney failure, unspecified: Secondary | ICD-10-CM

## 2019-08-18 DIAGNOSIS — Z7982 Long term (current) use of aspirin: Secondary | ICD-10-CM | POA: Diagnosis not present

## 2019-08-18 DIAGNOSIS — I4819 Other persistent atrial fibrillation: Secondary | ICD-10-CM

## 2019-08-18 DIAGNOSIS — Z7189 Other specified counseling: Secondary | ICD-10-CM

## 2019-08-18 MED ORDER — APIXABAN 5 MG PO TABS: 5.0000 mg | ORAL_TABLET | Freq: Two times a day (BID) | ORAL | 11 refills | Status: DC

## 2019-08-18 NOTE — Patient Instructions (Signed)
Medication Instructions:  Please stop your Asprin.  Start Eliquis 5 mg one tablet twice a day. Continue all other medications as listed.  *If you need a refill on your cardiac medications before your next appointment, please call your pharmacy*  Lab Work: Please have blood work in 1 month at [facility] ([facility]) If you have labs (blood work) drawn today and your tests are completely normal, you will receive your results only by: Marland Kitchen MyChart Message (if you have MyChart) OR . A paper copy in the mail If you have any lab test that is abnormal or we need to change your treatment, we will call you to review the results.  Follow-Up: At [facility], you and your health needs are our priority.  As part of our continuing mission to provide you with exceptional heart care, we have created designated Provider Care Teams.  These Care Teams include your primary Cardiologist (physician) and Advanced Practice Providers (APPs -  Physician Assistants and Nurse Practitioners) who all work together to provide you with the care you need, when you need it.  Your next appointment:   6 month(s)  The format for your next appointment:   In Person  Provider:   Minus Breeding, MD  Thank you for choosing [facility]!!

## 2019-09-15 ENCOUNTER — Other Ambulatory Visit: Payer: Self-pay

## 2019-09-15 ENCOUNTER — Other Ambulatory Visit: Payer: Medicare Other

## 2019-09-15 DIAGNOSIS — Z79899 Other long term (current) drug therapy: Secondary | ICD-10-CM | POA: Diagnosis not present

## 2019-09-15 DIAGNOSIS — I4819 Other persistent atrial fibrillation: Secondary | ICD-10-CM | POA: Diagnosis not present

## 2019-09-15 LAB — CBC
Hematocrit: 50.2 % (ref 37.5–51.0)
Hemoglobin: 17.1 g/dL (ref 13.0–17.7)
MCH: 29.3 pg (ref 26.6–33.0)
MCHC: 34.1 g/dL (ref 31.5–35.7)
MCV: 86 fL (ref 79–97)
Platelets: 592 10*3/uL — ABNORMAL HIGH (ref 150–450)
RBC: 5.83 x10E6/uL — ABNORMAL HIGH (ref 4.14–5.80)
RDW: 14.4 % (ref 11.6–15.4)
WBC: 11.7 10*3/uL — ABNORMAL HIGH (ref 3.4–10.8)

## 2019-10-05 ENCOUNTER — Other Ambulatory Visit: Payer: Self-pay

## 2019-10-06 ENCOUNTER — Ambulatory Visit (INDEPENDENT_AMBULATORY_CARE_PROVIDER_SITE_OTHER): Payer: Medicare Other | Admitting: Family Medicine

## 2019-10-06 ENCOUNTER — Encounter: Payer: Self-pay | Admitting: Family Medicine

## 2019-10-06 VITALS — BP 147/91 | HR 55 | Temp 98.4°F | Resp 20 | Ht 69.0 in | Wt 219.0 lb

## 2019-10-06 DIAGNOSIS — I4819 Other persistent atrial fibrillation: Secondary | ICD-10-CM | POA: Diagnosis not present

## 2019-10-06 DIAGNOSIS — F411 Generalized anxiety disorder: Secondary | ICD-10-CM

## 2019-10-06 DIAGNOSIS — E782 Mixed hyperlipidemia: Secondary | ICD-10-CM

## 2019-10-06 DIAGNOSIS — I10 Essential (primary) hypertension: Secondary | ICD-10-CM | POA: Diagnosis not present

## 2019-10-06 DIAGNOSIS — Z6832 Body mass index (BMI) 32.0-32.9, adult: Secondary | ICD-10-CM

## 2019-10-06 MED ORDER — LISINOPRIL-HYDROCHLOROTHIAZIDE 20-12.5 MG PO TABS: 1.0000 | ORAL_TABLET | Freq: Every day | ORAL | 3 refills | Status: DC

## 2019-10-06 MED ORDER — METOPROLOL TARTRATE 50 MG PO TABS: 50.0000 mg | ORAL_TABLET | Freq: Two times a day (BID) | ORAL | 3 refills | Status: DC

## 2019-10-06 MED ORDER — PAROXETINE HCL 10 MG PO TABS: 10.0000 mg | ORAL_TABLET | Freq: Every day | ORAL | 3 refills | Status: DC

## 2019-10-06 NOTE — Patient Instructions (Signed)
Preventing High Cholesterol Cholesterol is a white, waxy substance similar to fat that the human body needs to help build cells. The liver makes all the cholesterol that a person's body needs. Having high cholesterol (hypercholesterolemia) increases a person's risk for heart disease and stroke. Extra (excess) cholesterol comes from the food the person eats. High cholesterol can often be prevented with diet and lifestyle changes. If you already have high cholesterol, you can control it with diet and lifestyle changes and with medicine. How can high cholesterol affect me? If you have high cholesterol, deposits (plaques) may build up on the walls of your arteries. The arteries are the blood vessels that carry blood away from your heart. Plaques make the arteries narrower and stiffer. This can limit or block blood flow and cause blood clots to form. Blood clots:  Are tiny balls of cells that form in your blood.  Can move to the heart or brain, causing a heart attack or stroke. Plaques in arteries greatly increase your risk for heart attack and stroke.Making diet and lifestyle changes can reduce your risk for these conditions that may threaten your life. What can increase my risk? This condition is more likely to develop in people who:  Eat foods that are high in saturated fat or cholesterol. Saturated fat is mostly found in: ? Foods that contain animal fat, such as red meat and some dairy products. ? Certain fatty foods made from plants, such as tropical oils.  Are overweight.  Are not getting enough exercise.  Have a family history of high cholesterol. What actions can I take to prevent this? Nutrition   Eat less saturated fat.  Avoid trans fats (partially hydrogenated oils). These are often found in margarine and in some baked goods, fried foods, and snacks bought in packages.  Avoid precooked or cured meat, such as sausages or meat loaves.  Avoid foods and drinks that have added  sugars.  Eat more fruits, vegetables, and whole grains.  Choose healthy sources of protein, such as fish, poultry, lean cuts of red meat, beans, peas, lentils, and nuts.  Choose healthy sources of fat, such as: ? Nuts. ? Vegetable oils, especially olive oil. ? Fish that have healthy fats (omega-3 fatty acids), such as mackerel or salmon. The items listed above may not be a complete list of recommended foods and beverages. Contact a dietitian for more information. Lifestyle  Lose weight if you are overweight. Losing 5-10 lb (2.3-4.5 kg) can help prevent or control high cholesterol. It can also lower your risk for diabetes and high blood pressure. Ask your health care provider to help you with a diet and exercise plan to lose weight safely.  Do not use any products that contain nicotine or tobacco, such as cigarettes, e-cigarettes, and chewing tobacco. If you need help quitting, ask your health care provider.  Limit your alcohol intake. ? Do not drink alcohol if:  Your health care provider tells you not to drink.  You are pregnant, may be pregnant, or are planning to become pregnant. ? If you drink alcohol:  Limit how much you use to:  0-1 drink a day for women.  0-2 drinks a day for men.  Be aware of how much alcohol is in your drink. In the [place] one drink equals one 12 oz bottle of beer (355 mL), one 5 oz glass of wine (148 mL), or one 1 oz glass of hard liquor (44 mL). Activity   Get enough exercise. Each week, do at   least 150 minutes of exercise that takes a medium level of effort (moderate-intensity exercise). ? This is exercise that:  Makes your heart beat faster and makes you breathe harder than usual.  Allows you to still be able to talk. ? You could exercise in short sessions several times a day or longer sessions a few times a week. For example, on 5 days each week, you could walk fast or ride your bike 3 times a day for 10 minutes each time.  Do exercises as told  by your health care provider. Medicines  In addition to diet and lifestyle changes, your health care provider may recommend medicines to help lower cholesterol. This may be a medicine to lower the amount of cholesterol your liver makes. You may need medicine if: ? Diet and lifestyle changes do not lower your cholesterol enough. ? You have high cholesterol and other risk factors for heart disease or stroke.  Take over-the-counter and prescription medicines only as told by your health care provider. General information  Manage your risk factors for high cholesterol. Talk with your health care provider about all your risk factors and how to lower your risk.  Manage other conditions that you have, such as diabetes or high blood pressure (hypertension).  Have blood tests to check your cholesterol levels at regular points in time as told by your health care provider.  Keep all follow-up visits as told by your health care provider. This is important. Where to find more information  American Heart Association: www.heart.org  National Heart, Lung, and Blood Institute: www.nhlbi.nih.gov Summary  High cholesterol increases your risk for heart disease and stroke. By keeping your cholesterol level low, you can reduce your risk for these conditions.  High cholesterol can often be prevented with diet and lifestyle changes.  Work with your health care provider to manage your risk factors, and have your blood tested regularly. This information is not intended to replace advice given to you by your health care provider. Make sure you discuss any questions you have with your health care provider. Document Revised: 11/06/2018 Document Reviewed: 03/23/2016 Elsevier Patient Education  2020 [employer]  

## 2019-10-06 NOTE — Progress Notes (Signed)
Subjective:  Patient ID: Justin Gibbs, male    DOB: 1940-08-25, 79 y.o.   MRN: [id number]  Patient Care Team: Baruch Gouty, FNP as PCP - General (Family Medicine) Minus Breeding, MD as PCP - Cardiology (Cardiology)   Chief Complaint:  Medical Management of Chronic Issues (3 mo ) and Hypertension   HPI: Justin Gibbs is a 79 y.o. male presenting on 10/06/2019 for Medical Management of Chronic Issues (3 mo ) and Hypertension   1. Persistent atrial fibrillation ([place]) Recently seen by cardiology and started on apixaban. Pt is doing well on medication. No abnormal bleeding or bruising. No palpitations, dizziness, weakness, or syncope. No orthopnea or PND. No lower extremity swelling.   2. GAD (generalized anxiety disorder) Doing well on paroxetine. States symptoms are well controlled on current dose. No new or worsening symptoms.   3. Essential hypertension Has had slightly elevated readings over the past few weeks but nothing significant. States cardiology did not want to make adjustments at this time. He denies headache, chest pain, leg swelling, dizziness, confusion, weakness, or syncope. No focal deficits.   4. Mixed hyperlipidemia Intolerant to statin therapy. Unable to take fish oil due to epistaxis. Does try to watch diet and stays active. No regular exercise.     Relevant past medical, surgical, family, and social history reviewed and updated as indicated.  Allergies and medications reviewed and updated. Date reviewed: Chart in Epic.   Past Medical History:  Diagnosis Date  . Cancer [facility])    prostate / removed 2006  . Depression   . High triglycerides   . Hypertension     Past Surgical History:  Procedure Laterality Date  . HERNIA REPAIR Bilateral    x 2   . PROSTATECTOMY      Social History   Socioeconomic History  . Marital status: Married    Spouse name: Dawn  . Number of children: 3  . Years of education: Not on file  . Highest education level:  Not on file  Occupational History  . Occupation: retired     Comment: 2007  Tobacco Use  . Smoking status: Former Research scientist (life sciences)  . Smokeless tobacco: Never Used  Substance and Sexual Activity  . Alcohol use: Never  . Drug use: Never  . Sexual activity: Not on file  Other Topics Concern  . Not on file  Social History Narrative   Lives wife.  Son.     Social Determinants of Health   Financial Resource Strain:   . Difficulty of Paying Living Expenses: Not on file  Food Insecurity:   . Worried About Charity fundraiser in the Last Year: Not on file  . Ran Out of Food in the Last Year: Not on file  Transportation Needs:   . Lack of Transportation (Medical): Not on file  . Lack of Transportation (Non-Medical): Not on file  Physical Activity:   . Days of Exercise per Week: Not on file  . Minutes of Exercise per Session: Not on file  Stress:   . Feeling of Stress : Not on file  Social Connections:   . Frequency of Communication with Friends and Family: Not on file  . Frequency of Social Gatherings with Friends and Family: Not on file  . Attends Religious Services: Not on file  . Active Member of Clubs or Organizations: Not on file  . Attends Archivist Meetings: Not on file  . Marital Status: Not on file  Intimate  Partner Violence:   . Fear of Current or Ex-Partner: Not on file  . Emotionally Abused: Not on file  . Physically Abused: Not on file  . Sexually Abused: Not on file    Outpatient Encounter Medications as of 10/06/2019  Medication Sig  . apixaban (ELIQUIS) 5 MG TABS tablet Take 1 tablet (5 mg total) by mouth 2 (two) times daily.  Marland Kitchen lisinopril-hydrochlorothiazide (ZESTORETIC) 20-12.5 MG tablet Take 1 tablet by mouth daily.  . metoprolol tartrate (LOPRESSOR) 50 MG tablet Take 1 tablet (50 mg total) by mouth 2 (two) times daily.  . Multiple Vitamin (MULTIVITAMIN) capsule Take 1 capsule by mouth daily.  Marland Kitchen PARoxetine (PAXIL) 10 MG tablet Take 1 tablet (10 mg total)  by mouth daily. Take 1/2 tablet twice daily  . [DISCONTINUED] lisinopril-hydrochlorothiazide (ZESTORETIC) 20-12.5 MG tablet Take 1 tablet by mouth daily.  . [DISCONTINUED] metoprolol tartrate (LOPRESSOR) 50 MG tablet Take 50 mg by mouth 2 (two) times daily.  . [DISCONTINUED] PARoxetine (PAXIL) 10 MG tablet 10 mg. Take 1/2 tablet twice daily  . [DISCONTINUED] amoxicillin (AMOXIL) 500 MG capsule Take 1,000 mg by mouth 2 (two) times daily.   No facility-administered encounter medications on file as of 10/06/2019.    No Known Allergies  Review of Systems  Constitutional: Negative for activity change, appetite change, chills, diaphoresis, fatigue, fever and unexpected weight change.  HENT: Positive for hearing loss.   Eyes: Negative.  Negative for photophobia and visual disturbance.  Respiratory: Negative for cough, chest tightness and shortness of breath.   Cardiovascular: Negative for chest pain, palpitations and leg swelling.  Gastrointestinal: Negative for abdominal pain, anal bleeding, blood in stool, constipation, diarrhea, nausea and vomiting.  Endocrine: Negative.  Negative for cold intolerance, heat intolerance, polydipsia, polyphagia and polyuria.  Genitourinary: Negative for decreased urine volume, difficulty urinating, dysuria, flank pain, frequency, hematuria and urgency.  Musculoskeletal: Negative for arthralgias, back pain, gait problem, joint swelling and myalgias.  Skin: Negative.   Allergic/Immunologic: Negative.   Neurological: Negative for dizziness, tremors, syncope, facial asymmetry, weakness, light-headedness, numbness and headaches.  Hematological: Negative.  Does not bruise/bleed easily.  Psychiatric/Behavioral: Negative for agitation, behavioral problems, confusion, decreased concentration, dysphoric mood, hallucinations, self-injury, sleep disturbance and suicidal ideas. The patient is nervous/anxious. The patient is not hyperactive.   All other systems reviewed and are  negative.       Objective:  BP (!) 147/91 (BP Location: Right Arm, Cuff Size: Large)   Pulse (!) 55   Temp 98.4 F (36.9 C)   Resp 20   Ht 5' 9"  (1.753 m)   Wt 219 lb (99.3 kg)   SpO2 99%   BMI 32.34 kg/m    Wt Readings from Last 3 Encounters:  10/06/19 219 lb (99.3 kg)  08/18/19 220 lb (99.8 kg)  07/07/19 227 lb (103 kg)    Physical Exam Vitals and nursing note reviewed.  Constitutional:      General: He is not in acute distress.    Appearance: Normal appearance. He is well-developed and well-groomed. He is obese. He is not ill-appearing, toxic-appearing or diaphoretic.  HENT:     Head: Normocephalic and atraumatic.     Jaw: There is normal jaw occlusion.     Right Ear: Decreased hearing noted.     Left Ear: Decreased hearing noted.     Ears:     Comments: Hearing aids    Nose: Nose normal.     Mouth/Throat:     Lips: Pink.  Mouth: Mucous membranes are moist.     Pharynx: Oropharynx is clear. Uvula midline.  Eyes:     General: Lids are normal.     Extraocular Movements: Extraocular movements intact.     Conjunctiva/sclera: Conjunctivae normal.     Pupils: Pupils are equal, round, and reactive to light.  Neck:     Thyroid: No thyroid mass, thyromegaly or thyroid tenderness.     Vascular: No carotid bruit or JVD.     Trachea: Trachea and phonation normal.  Cardiovascular:     Rate and Rhythm: Normal rate. Rhythm irregular.     Chest Wall: PMI is not displaced.     Pulses: Normal pulses.     Heart sounds: Normal heart sounds. No murmur. No friction rub. No gallop.   Pulmonary:     Effort: Pulmonary effort is normal. No respiratory distress.     Breath sounds: Normal breath sounds. No wheezing.  Abdominal:     General: Bowel sounds are normal. There is no distension or abdominal bruit.     Palpations: Abdomen is soft. There is no hepatomegaly or splenomegaly.     Tenderness: There is no abdominal tenderness. There is no right CVA tenderness or left CVA  tenderness.     Hernia: No hernia is present.  Musculoskeletal:        General: Normal range of motion.     Cervical back: Normal range of motion and neck supple.     Right lower leg: No edema.     Left lower leg: No edema.  Lymphadenopathy:     Cervical: No cervical adenopathy.  Skin:    General: Skin is warm and dry.     Capillary Refill: Capillary refill takes less than 2 seconds.     Coloration: Skin is not cyanotic, jaundiced or pale.     Findings: No rash.  Neurological:     General: No focal deficit present.     Mental Status: He is alert and oriented to person, place, and time.     Cranial Nerves: Cranial nerves are intact. No cranial nerve deficit.     Sensory: Sensation is intact. No sensory deficit.     Motor: Motor function is intact. No weakness.     Coordination: Coordination is intact. Coordination normal.     Gait: Gait is intact. Gait normal.     Deep Tendon Reflexes: Reflexes are normal and symmetric. Reflexes normal.  Psychiatric:        Attention and Perception: Attention and perception normal.        Mood and Affect: Mood and affect normal.        Speech: Speech normal.        Behavior: Behavior normal. Behavior is cooperative.        Thought Content: Thought content normal.        Cognition and Memory: Cognition and memory normal.        Judgment: Judgment normal.     Results for orders placed or performed in visit on 09/15/19  CBC  Result Value Ref Range   WBC 11.7 (H) 3.4 - 10.8 x10E3/uL   RBC 5.83 (H) 4.14 - 5.80 x10E6/uL   Hemoglobin 17.1 13.0 - 17.7 g/dL   Hematocrit 50.2 37.5 - 51.0 %   MCV 86 79 - 97 fL   MCH 29.3 26.6 - 33.0 pg   MCHC 34.1 31.5 - 35.7 g/dL   RDW 14.4 11.6 - 15.4 %   Platelets 592 (H) 150 - 450  x10E3/uL       Pertinent labs & imaging results that were available during my care of the patient were reviewed by me and considered in my medical decision making.  Assessment & Plan:  Zayquan was seen today for medical management  of chronic issues and hypertension.  Diagnoses and all orders for this visit:  Persistent atrial fibrillation ([place]) Doing well on below, will continue. Recently started on apixaban. Will follow up with cardiology as scheduled.  -     CBC with Differential/Platelet -     metoprolol tartrate (LOPRESSOR) 50 MG tablet; Take 1 tablet (50 mg total) by mouth 2 (two) times daily.  GAD (generalized anxiety disorder) Doing well on Paxil, will continue.  -     Thyroid Panel With TSH  Essential hypertension BP fairly controlled. Changes were not made in regimen today. Goal BP is 140/80. Pt aware to report any persistent high or low readings. DASH diet and exercise encouraged. Exercise at least 150 minutes per week and increase as tolerated. Goal BMI > 25. Stress management encouraged. Avoid nicotine and tobacco product use. Avoid excessive alcohol and NSAID's. Avoid more than 2000 mg of sodium daily. Medications as prescribed. Follow up as scheduled.  -     CMP14+EGFR -     CBC with Differential/Platelet -     Lipid panel -     Thyroid Panel With TSH -     metoprolol tartrate (LOPRESSOR) 50 MG tablet; Take 1 tablet (50 mg total) by mouth 2 (two) times daily. -     lisinopril-hydrochlorothiazide (ZESTORETIC) 20-12.5 MG tablet; Take 1 tablet by mouth daily.  Mixed hyperlipidemia Diet encouraged - increase intake of fresh fruits and vegetables, increase intake of lean proteins. Bake, broil, or grill foods. Avoid fried, greasy, and fatty foods. Avoid fast foods. Increase intake of fiber-rich whole grains. Exercise encouraged - at least 150 minutes per week and advance as tolerated.  Goal BMI < 25. Follow up in 3-6 months as discussed.  -     Lipid panel  BMI 32.0-32.9,adult Diet and exercise encouraged. Labs pending.  -     CMP14+EGFR -     CBC with Differential/Platelet -     Lipid panel -     Thyroid Panel With TSH  Continue all other maintenance medications.  Follow up plan: Return in about 3  months (around 01/06/2020), or if symptoms worsen or fail to improve, for HTN.  Continue healthy lifestyle choices, including diet (rich in fruits, vegetables, and lean proteins, and low in salt and simple carbohydrates) and exercise (at least 30 minutes of moderate physical activity daily).  Educational handout given for preventing high cholesterol  The above assessment and management plan was discussed with the patient. The patient verbalized understanding of and has agreed to the management plan. Patient is aware to call the clinic if they develop any new symptoms or if symptoms persist or worsen. Patient is aware when to return to the clinic for a follow-up visit. Patient educated on when it is appropriate to go to the emergency department.   Monia Pouch, FNP-C [facility] [phone]

## 2019-10-07 LAB — CBC WITH DIFFERENTIAL/PLATELET
Basophils Absolute: 0.1 10*3/uL (ref 0.0–0.2)
Basos: 1 %
EOS (ABSOLUTE): 0.4 10*3/uL (ref 0.0–0.4)
Eos: 4 %
Hematocrit: 48.9 % (ref 37.5–51.0)
Hemoglobin: 16.3 g/dL (ref 13.0–17.7)
Immature Grans (Abs): 0 10*3/uL (ref 0.0–0.1)
Immature Granulocytes: 0 %
Lymphocytes Absolute: 1.7 10*3/uL (ref 0.7–3.1)
Lymphs: 16 %
MCH: 28.3 pg (ref 26.6–33.0)
MCHC: 33.3 g/dL (ref 31.5–35.7)
MCV: 85 fL (ref 79–97)
Monocytes Absolute: 0.8 10*3/uL (ref 0.1–0.9)
Monocytes: 7 %
Neutrophils Absolute: 7.6 10*3/uL — ABNORMAL HIGH (ref 1.4–7.0)
Neutrophils: 72 %
Platelets: 565 10*3/uL — ABNORMAL HIGH (ref 150–450)
RBC: 5.76 x10E6/uL (ref 4.14–5.80)
RDW: 14.6 % (ref 11.6–15.4)
WBC: 10.6 10*3/uL (ref 3.4–10.8)

## 2019-10-07 LAB — CMP14+EGFR
ALT: 22 IU/L (ref 0–44)
AST: 22 IU/L (ref 0–40)
Albumin/Globulin Ratio: 1.7 (ref 1.2–2.2)
Albumin: 4.3 g/dL (ref 3.7–4.7)
Alkaline Phosphatase: 62 IU/L (ref 39–117)
BUN/Creatinine Ratio: 23 (ref 10–24)
BUN: 25 mg/dL (ref 8–27)
Bilirubin Total: 0.7 mg/dL (ref 0.0–1.2)
CO2: 26 mmol/L (ref 20–29)
Calcium: 9.4 mg/dL (ref 8.6–10.2)
Chloride: 100 mmol/L (ref 96–106)
Creatinine, Ser: 1.09 mg/dL (ref 0.76–1.27)
GFR calc Af Amer: 76 mL/min/{1.73_m2} (ref >59)
GFR calc non Af Amer: 66 mL/min/{1.73_m2} (ref >59)
Globulin, Total: 2.5 g/dL (ref 1.5–4.5)
Glucose: 75 mg/dL (ref 65–99)
Potassium: 4.7 mmol/L (ref 3.5–5.2)
Sodium: 141 mmol/L (ref 134–144)
Total Protein: 6.8 g/dL (ref 6.0–8.5)

## 2019-10-07 LAB — LIPID PANEL
Chol/HDL Ratio: 3.8 ratio (ref 0.0–5.0)
Cholesterol, Total: 144 mg/dL (ref 100–199)
HDL: 38 mg/dL — ABNORMAL LOW (ref >39)
LDL Chol Calc (NIH): 86 mg/dL (ref 0–99)
Triglycerides: 110 mg/dL (ref 0–149)
VLDL Cholesterol Cal: 20 mg/dL (ref 5–40)

## 2019-10-07 LAB — THYROID PANEL WITH TSH
Free Thyroxine Index: 1.7 (ref 1.2–4.9)
T3 Uptake Ratio: 27 % (ref 24–39)
T4, Total: 6.4 ug/dL (ref 4.5–12.0)
TSH: 3.22 u[IU]/mL (ref 0.450–4.500)

## 2019-11-16 DIAGNOSIS — Z87898 Personal history of other specified conditions: Secondary | ICD-10-CM | POA: Diagnosis not present

## 2019-12-13 MED ORDER — RIVAROXABAN 20 MG PO TABS: 20.0000 mg | ORAL_TABLET | Freq: Every day | ORAL | 3 refills | Status: DC

## 2019-12-13 NOTE — Telephone Encounter (Signed)
Called and spoke with the pt and he says that he still has a mild headache...  He denies dizziness, weaknes on one side. No sinus issues.   He would like to change his med at this point as offered by Dr. Percival Spanish.   He will give it several days and let us know how he is doing.   About 3% of people can have a headache. We can change ot Xarelto 20 mg po daily. Stop Eliquis.    Message text   Per Dr Percival Spanish we can change you to Xarelto 20 mg a day since you are having headaches with the Eliquis.  Please let me know if you would like to change and if so, which pharmacy you would like the prescription sent to.

## 2020-02-02 DIAGNOSIS — Z87898 Personal history of other specified conditions: Secondary | ICD-10-CM | POA: Diagnosis not present

## 2020-02-08 ENCOUNTER — Other Ambulatory Visit: Payer: Self-pay

## 2020-02-08 ENCOUNTER — Encounter: Payer: Self-pay | Admitting: Nurse Practitioner

## 2020-02-08 ENCOUNTER — Ambulatory Visit (INDEPENDENT_AMBULATORY_CARE_PROVIDER_SITE_OTHER): Payer: Medicare Other | Admitting: Nurse Practitioner

## 2020-02-08 VITALS — BP 140/90 | HR 50 | Temp 97.5°F | Resp 20 | Ht 69.0 in | Wt 219.0 lb

## 2020-02-08 DIAGNOSIS — I480 Paroxysmal atrial fibrillation: Secondary | ICD-10-CM | POA: Diagnosis not present

## 2020-02-08 DIAGNOSIS — Z6832 Body mass index (BMI) 32.0-32.9, adult: Secondary | ICD-10-CM

## 2020-02-08 DIAGNOSIS — I10 Essential (primary) hypertension: Secondary | ICD-10-CM

## 2020-02-08 DIAGNOSIS — F411 Generalized anxiety disorder: Secondary | ICD-10-CM | POA: Diagnosis not present

## 2020-02-08 DIAGNOSIS — E782 Mixed hyperlipidemia: Secondary | ICD-10-CM

## 2020-02-08 DIAGNOSIS — I4819 Other persistent atrial fibrillation: Secondary | ICD-10-CM

## 2020-02-08 MED ORDER — PAROXETINE HCL 10 MG PO TABS: 10.0000 mg | ORAL_TABLET | Freq: Every day | ORAL | 3 refills | Status: DC

## 2020-02-08 MED ORDER — METOPROLOL TARTRATE 50 MG PO TABS: 50.0000 mg | ORAL_TABLET | Freq: Two times a day (BID) | ORAL | 3 refills | Status: DC

## 2020-02-08 MED ORDER — LISINOPRIL-HYDROCHLOROTHIAZIDE 20-25 MG PO TABS: 1.0000 | ORAL_TABLET | Freq: Every day | ORAL | 3 refills | Status: DC

## 2020-02-08 NOTE — Patient Instructions (Signed)
Exercising to Stay Healthy To become healthy and stay healthy, it is recommended that you do moderate-intensity and vigorous-intensity exercise. You can tell that you are exercising at a moderate intensity if your heart starts beating faster and you start breathing faster but can still hold a conversation. You can tell that you are exercising at a vigorous intensity if you are breathing much harder and faster and cannot hold a conversation while exercising. Exercising regularly is important. It has many health benefits, such as:  Improving overall fitness, flexibility, and endurance.  Increasing bone density.  Helping with weight control.  Decreasing body fat.  Increasing muscle strength.  Reducing stress and tension.  Improving overall health. How often should I exercise? Choose an activity that you enjoy, and set realistic goals. Your health care provider can help you make an activity plan that works for you. Exercise regularly as told by your health care provider. This may include:  Doing strength training two times a week, such as: ? Lifting weights. ? Using resistance bands. ? Push-ups. ? Sit-ups. ? Yoga.  Doing a certain intensity of exercise for a given amount of time. Choose from these options: ? A total of 150 minutes of moderate-intensity exercise every week. ? A total of 75 minutes of vigorous-intensity exercise every week. ? A mix of moderate-intensity and vigorous-intensity exercise every week. Children, pregnant women, people who have not exercised regularly, people who are overweight, and older adults may need to talk with a health care provider about what activities are safe to do. If you have a medical condition, be sure to talk with your health care provider before you start a new exercise program. What are some exercise ideas? Moderate-intensity exercise ideas include:  Walking 1 mile (1.6 km) in about 15  minutes.  Biking.  Hiking.  Golfing.  Dancing.  Water aerobics. Vigorous-intensity exercise ideas include:  Walking 4.5 miles (7.2 km) or more in about 1 hour.  Jogging or running 5 miles (8 km) in about 1 hour.  Biking 10 miles (16.1 km) or more in about 1 hour.  Lap swimming.  Roller-skating or in-line skating.  Cross-country skiing.  Vigorous competitive sports, such as football, basketball, and soccer.  Jumping rope.  Aerobic dancing. What are some everyday activities that can help me to get exercise?  Yard work, such as: ? Pushing a lawn mower. ? Raking and bagging leaves.  Washing your car.  Pushing a stroller.  Shoveling snow.  Gardening.  Washing windows or floors. How can I be more active in my day-to-day activities?  Use stairs instead of an elevator.  Take a walk during your lunch break.  If you drive, park your car farther away from your work or school.  If you take public transportation, get off one stop early and walk the rest of the way.  Stand up or walk around during all of your indoor phone calls.  Get up, stretch, and walk around every 30 minutes throughout the day.  Enjoy exercise with a friend. Support to continue exercising will help you keep a regular routine of activity. What guidelines can I follow while exercising?  Before you start a new exercise program, talk with your health care provider.  Do not exercise so much that you hurt yourself, feel dizzy, or get very short of breath.  Wear comfortable clothes and wear shoes with good support.  Drink plenty of water while you exercise to prevent dehydration or heat stroke.  Work out until your breathing   and your heartbeat get faster. Where to find more information  [place] Department of Health and Human Services: www.hhs.gov  Centers for Disease Control and Prevention (CDC): www.cdc.gov Summary  Exercising regularly is important. It will improve your overall fitness,  flexibility, and endurance.  Regular exercise also will improve your overall health. It can help you control your weight, reduce stress, and improve your bone density.  Do not exercise so much that you hurt yourself, feel dizzy, or get very short of breath.  Before you start a new exercise program, talk with your health care provider. This information is not intended to replace advice given to you by your health care provider. Make sure you discuss any questions you have with your health care provider. Document Revised: 06/27/2017 Document Reviewed: 06/05/2017 Elsevier Patient Education  2020 Elsevier Inc.  

## 2020-02-08 NOTE — Progress Notes (Signed)
Subjective:    Patient ID: Justin Gibbs, male    DOB: 02/22/1941, 79 y.o.   MRN: [id number]   Chief Complaint: Medical Management of Chronic Issues    HPI:  1. Essential hypertension No c/o chest pain, or headache. He has some SOB but has had that every since he has been in atrial fib. He checks his blood pressure at h ome and it is running around low 938'B systolic BP Readings from Last 3 Encounters:  02/08/20 140/90  10/06/19 (!) 147/91  08/18/19 (!) 160/100     2. Paroxysmal atrial fibrillation ([facility]) stays in atrial fib with good rate control.  3. Mixed hyperlipidemia He does not wtach diet and does little to no exercise. Lab Results  Component Value Date   CHOL 144 10/06/2019   HDL 38 (L) 10/06/2019   LDLCALC 86 10/06/2019   TRIG 110 10/06/2019   CHOLHDL 3.8 10/06/2019   The 10-year ASCVD risk score Justin Bussing DC Jr., et al., 2013) is: 31.5%   Values used to calculate the score:     Age: 57 years     Sex: Male     Is Non-Hispanic African American: No     Diabetic: No     Tobacco smoker: No     Systolic Blood Pressure: 017 mmHg     Is BP treated: No     HDL Cholesterol: 38 mg/dL     Total Cholesterol: 144 mg/dL   4. GAD (generalized anxiety disorder) He is currently on paxil an dis doing well. GAD 7 : Generalized Anxiety Score 02/08/2020  Nervous, Anxious, on Edge 0  Control/stop worrying 0  Worry too much - different things 0  Trouble relaxing 0  Restless 0  Easily annoyed or irritable 0  Afraid - awful might happen 0  Total GAD 7 Score 0  Anxiety Difficulty Not difficult at all      5. BMI 32.0-32.9,adult No recent weight changes Wt Readings from Last 3 Encounters:  02/08/20 219 lb (99.3 kg)  10/06/19 219 lb (99.3 kg)  08/18/19 220 lb (99.8 kg)   BMI Readings from Last 3 Encounters:  02/08/20 32.34 kg/m  10/06/19 32.34 kg/m  08/18/19 32.49 kg/m       Outpatient Encounter Medications as of 02/08/2020  Medication Sig  . Multiple  Vitamin (MULTIVITAMIN) capsule Take 1 capsule by mouth daily.  . rivaroxaban (XARELTO) 20 MG TABS tablet Take 1 tablet (20 mg total) by mouth daily with supper.  Marland Kitchen lisinopril-hydrochlorothiazide (ZESTORETIC) 20-12.5 MG tablet Take 1 tablet by mouth daily.  . metoprolol tartrate (LOPRESSOR) 50 MG tablet Take 1 tablet (50 mg total) by mouth 2 (two) times daily.  Marland Kitchen PARoxetine (PAXIL) 10 MG tablet Take 1 tablet (10 mg total) by mouth daily. Take 1/2 tablet twice daily   No facility-administered encounter medications on file as of 02/08/2020.    Past Surgical History:  Procedure Laterality Date  . HERNIA REPAIR Bilateral    x 2   . PROSTATECTOMY      Family History  Problem Relation Age of Onset  . Alzheimer's disease Mother   . Alcohol abuse Father   . Cancer Father        leukemia  . Cirrhosis Father   . Stroke Brother   . Diabetes Daughter     New complaints: None today  Social history: Lives with his wife  Controlled substance contract: n/a    Review of Systems  Constitutional: Negative for diaphoresis.  Eyes: Negative  for pain.  Respiratory: Negative for shortness of breath.   Cardiovascular: Negative for chest pain, palpitations and leg swelling.  Gastrointestinal: Negative for abdominal pain.  Endocrine: Negative for polydipsia.  Skin: Negative for rash.  Neurological: Negative for dizziness, weakness and headaches.  Hematological: Does not bruise/bleed easily.  All other systems reviewed and are negative.      Objective:   Physical Exam Vitals and nursing note reviewed.  Constitutional:      Appearance: Normal appearance. He is well-developed.  HENT:     Head: Normocephalic.     Nose: Nose normal.  Eyes:     Pupils: Pupils are equal, round, and reactive to light.  Neck:     Thyroid: No thyroid mass or thyromegaly.     Vascular: No carotid bruit or JVD.     Trachea: Phonation normal.  Cardiovascular:     Rate and Rhythm: Normal rate and regular  rhythm.  Pulmonary:     Effort: Pulmonary effort is normal. No respiratory distress.     Breath sounds: Normal breath sounds.  Abdominal:     General: Bowel sounds are normal.     Palpations: Abdomen is soft.     Tenderness: There is no abdominal tenderness.  Musculoskeletal:        General: Normal range of motion.     Cervical back: Normal range of motion and neck supple.     Right lower leg: Edema (1+) present.     Left lower leg: Edema (1+) present.  Lymphadenopathy:     Cervical: No cervical adenopathy.  Skin:    General: Skin is warm and dry.  Neurological:     Mental Status: He is alert and oriented to person, place, and time.  Psychiatric:        Behavior: Behavior normal.        Thought Content: Thought content normal.        Judgment: Judgment normal.     BP 140/90   Pulse (!) 50   Temp (!) 97.5 F (36.4 C) (Temporal)   Resp 20   Ht 5' 9"  (1.753 m)   Wt 219 lb (99.3 kg)   SpO2 93%   BMI 32.34 kg/m        Assessment & Plan:  Justin Gibbs comes in today with chief complaint of Medical Management of Chronic Issues   Diagnosis and orders addressed:  1. Essential hypertension Changed lisinopril to 20/25 once daily Low sodium diet - lisinopril-hydrochlorothiazide (ZESTORETIC) 20-25 MG tablet; Take 1 tablet by mouth daily.  Dispense: 90 tablet; Refill: 3 - metoprolol tartrate (LOPRESSOR) 50 MG tablet; Take 1 tablet (50 mg total) by mouth 2 (two) times daily.  Dispense: 180 tablet; Refill: 3 - CBC with Differential/Platelet - CMP14+EGFR  2. Paroxysmal atrial fibrillation (HCC) Avoid caffeine - metoprolol tartrate (LOPRESSOR) 50 MG tablet; Take 1 tablet (50 mg total) by mouth 2 (two) times daily.  Dispense: 180 tablet; Refill: 3  3. Mixed hyperlipidemia Low fat diet - Lipid panel  4. GAD (generalized anxiety disorder) Stress management - PARoxetine (PAXIL) 10 MG tablet; Take 1 tablet (10 mg total) by mouth daily. Take 1/2 tablet twice daily   Dispense: 90 tablet; Refill: 3  5. 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

## 2020-02-10 ENCOUNTER — Telehealth: Payer: Self-pay | Admitting: *Deleted

## 2020-02-10 DIAGNOSIS — F411 Generalized anxiety disorder: Secondary | ICD-10-CM

## 2020-02-10 MED ORDER — PAROXETINE HCL 10 MG PO TABS: 5.0000 mg | ORAL_TABLET | Freq: Two times a day (BID) | ORAL | 3 refills | Status: DC

## 2020-02-10 NOTE — Telephone Encounter (Signed)
Fax from [facility]: Paroxetine 10 mg tab SIG 1 QD & 1/2 BID TC to pt he takes 1/2 BID Corrected Rx sent to pharmacy

## 2020-02-17 DIAGNOSIS — Z87898 Personal history of other specified conditions: Secondary | ICD-10-CM | POA: Diagnosis not present

## 2020-03-17 MED ORDER — APIXABAN 5 MG PO TABS
5.0000 mg | ORAL_TABLET | Freq: Two times a day (BID) | ORAL | 11 refills | Status: DC
Start: 2020-03-17 — End: 2020-08-10

## 2020-03-17 NOTE — Telephone Encounter (Signed)
Per Dr. Percival Spanish -  Please start on Eliquis 5 mg bid and stop Xarelto.

## 2020-04-25 ENCOUNTER — Ambulatory Visit: Payer: Medicare Other

## 2020-05-17 DIAGNOSIS — Z87898 Personal history of other specified conditions: Secondary | ICD-10-CM | POA: Diagnosis not present

## 2020-06-28 DIAGNOSIS — I4891 Unspecified atrial fibrillation: Secondary | ICD-10-CM

## 2020-06-28 HISTORY — DX: Unspecified atrial fibrillation: I48.91

## 2020-08-10 ENCOUNTER — Other Ambulatory Visit: Payer: Self-pay

## 2020-08-10 ENCOUNTER — Encounter: Payer: Self-pay | Admitting: Nurse Practitioner

## 2020-08-10 ENCOUNTER — Ambulatory Visit (INDEPENDENT_AMBULATORY_CARE_PROVIDER_SITE_OTHER): Payer: Medicare Other | Admitting: Nurse Practitioner

## 2020-08-10 VITALS — BP 143/83 | HR 56 | Temp 97.7°F | Ht 69.0 in | Wt 221.0 lb

## 2020-08-10 DIAGNOSIS — I480 Paroxysmal atrial fibrillation: Secondary | ICD-10-CM

## 2020-08-10 DIAGNOSIS — E782 Mixed hyperlipidemia: Secondary | ICD-10-CM | POA: Diagnosis not present

## 2020-08-10 DIAGNOSIS — F411 Generalized anxiety disorder: Secondary | ICD-10-CM

## 2020-08-10 DIAGNOSIS — I10 Essential (primary) hypertension: Secondary | ICD-10-CM

## 2020-08-10 DIAGNOSIS — Z23 Encounter for immunization: Secondary | ICD-10-CM

## 2020-08-10 DIAGNOSIS — R972 Elevated prostate specific antigen [PSA]: Secondary | ICD-10-CM

## 2020-08-10 DIAGNOSIS — Z6832 Body mass index (BMI) 32.0-32.9, adult: Secondary | ICD-10-CM

## 2020-08-10 MED ORDER — APIXABAN 5 MG PO TABS: 5.0000 mg | ORAL_TABLET | Freq: Two times a day (BID) | ORAL | 11 refills | Status: DC

## 2020-08-10 MED ORDER — PAROXETINE HCL 10 MG PO TABS: 5.0000 mg | ORAL_TABLET | Freq: Two times a day (BID) | ORAL | 3 refills | Status: DC

## 2020-08-10 MED ORDER — LISINOPRIL-HYDROCHLOROTHIAZIDE 20-25 MG PO TABS: 1.0000 | ORAL_TABLET | Freq: Every day | ORAL | 3 refills | Status: DC

## 2020-08-10 MED ORDER — METOPROLOL TARTRATE 50 MG PO TABS: 50.0000 mg | ORAL_TABLET | Freq: Two times a day (BID) | ORAL | 3 refills | Status: DC

## 2020-08-10 NOTE — Progress Notes (Signed)
Subjective:    Patient ID: Justin Gibbs, male    DOB: [birth year], 80 y.o.   MRN: [id number]   Chief Complaint: Medical Management of Chronic Issues    HPI:  1. Need for vaccination against Streptococcus pneumoniae Is getting pneumonia vaccine today  2. Essential hypertension No c/o chest pain, sob or headace. Does not check blodd presure at home. BP Readings from Last 3 Encounters:  08/10/20 (!) 143/83  02/08/20 140/90  10/06/19 (!) 147/91     3. Mixed hyperlipidemia Does not watch diet and does very little exercise. Lab Results  Component Value Date   CHOL 144 10/06/2019   HDL 38 (L) 10/06/2019   LDLCALC 86 10/06/2019   TRIG 110 10/06/2019   CHOLHDL 3.8 10/06/2019     4. Paroxysmal atrial fibrillation (HCC) No c/o palpitations or heart racing  5. GAD (generalized anxiety disorder) Is on paxil daily and is doing well.  GAD 7 : Generalized Anxiety Score 08/10/2020 02/08/2020  Nervous, Anxious, on Edge 0 0  Control/stop worrying 0 0  Worry too much - different things 0 0  Trouble relaxing 0 0  Restless 0 0  Easily annoyed or irritable 0 0  Afraid - awful might happen 0 0  Total GAD 7 Score 0 0  Anxiety Difficulty Not difficult at all Not difficult at all       6. Elevated prostate specific antigen (PSA) He had a prostatectomy in 2006. His PSA jumped up to 3.0 at last check. His urologist wants him to repeat labs in 6 weeks and see him at end of March. Said he may have to do radiation treatments  7. BMI 32.0-32.9,adult No recent weight changes Wt Readings from Last 3 Encounters:  08/10/20 221 lb (100.2 kg)  02/08/20 219 lb (99.3 kg)  10/06/19 219 lb (99.3 kg)   BMI Readings from Last 3 Encounters:  08/10/20 32.64 kg/m  02/08/20 32.34 kg/m  10/06/19 32.34 kg/m       Outpatient Encounter Medications as of 08/10/2020  Medication Sig  . apixaban (ELIQUIS) 5 MG TABS tablet Take 1 tablet (5 mg total) by mouth 2 (two) times daily.  Marland Kitchen  lisinopril-hydrochlorothiazide (ZESTORETIC) 20-25 MG tablet Take 1 tablet by mouth daily.  . Multiple Vitamin (MULTIVITAMIN) capsule Take 1 capsule by mouth daily.  . metoprolol tartrate (LOPRESSOR) 50 MG tablet Take 1 tablet (50 mg total) by mouth 2 (two) times daily.  Marland Kitchen PARoxetine (PAXIL) 10 MG tablet Take 0.5 tablets (5 mg total) by mouth 2 (two) times daily.     Past Surgical History:  Procedure Laterality Date  . HERNIA REPAIR Bilateral    x 2   . PROSTATECTOMY      Family History  Problem Relation Age of Onset  . Alzheimer's disease Mother   . Alcohol abuse Father   . Cancer Father        leukemia  . Cirrhosis Father   . Stroke Brother   . Diabetes Daughter     New complaints: None today  Social history: Lives with his wife of 6o years  Controlled substance contract: n/a    Review of Systems  Constitutional: Negative for diaphoresis.  Eyes: Negative for pain.  Respiratory: Negative for shortness of breath.   Cardiovascular: Negative for chest pain, palpitations and leg swelling.  Gastrointestinal: Negative for abdominal pain.  Endocrine: Negative for polydipsia.  Skin: Negative for rash.  Neurological: Negative for dizziness, weakness and headaches.  Hematological: Does not bruise/bleed easily.  All other systems reviewed and are negative.      Objective:   Physical Exam Vitals and nursing note reviewed.  Constitutional:      Appearance: Normal appearance. He is well-developed and well-nourished.  HENT:     Head: Normocephalic.     Nose: Nose normal.     Mouth/Throat:     Mouth: Oropharynx is clear and moist.  Eyes:     Extraocular Movements: EOM normal.     Pupils: Pupils are equal, round, and reactive to light.  Neck:     Thyroid: No thyroid mass or thyromegaly.     Vascular: No carotid bruit or JVD.     Trachea: Phonation normal.  Cardiovascular:     Rate and Rhythm: Normal rate. Rhythm irregular.  Pulmonary:     Effort: Pulmonary effort  is normal. No respiratory distress.     Breath sounds: Normal breath sounds.  Abdominal:     General: Bowel sounds are normal. Aorta is normal.     Palpations: Abdomen is soft.     Tenderness: There is no abdominal tenderness.  Musculoskeletal:        General: Normal range of motion.     Cervical back: Normal range of motion and neck supple.  Lymphadenopathy:     Cervical: No cervical adenopathy.  Skin:    General: Skin is warm and dry.  Neurological:     Mental Status: He is alert and oriented to person, place, and time.  Psychiatric:        Mood and Affect: Mood and affect normal.        Behavior: Behavior normal.        Thought Content: Thought content normal.        Judgment: Judgment normal.     BP (!) 143/83   Pulse (!) 56   Temp 97.7 F (36.5 C)   Ht 5' 9"  (1.753 m)   Wt 221 lb (100.2 kg)   SpO2 96%   BMI 32.64 kg/m       Assessment & Plan:  Justin Gibbs comes in today with chief complaint of Medical Management of Chronic Issues   Diagnosis and orders addressed:  1. Need for vaccination against Streptococcus pneumoniae - Pneumococcal polysaccharide vaccine 23-valent greater than or equal to 2yo subcutaneous/IM  2. Essential hypertension Low sodium diet - lisinopril-hydrochlorothiazide (ZESTORETIC) 20-25 MG tablet; Take 1 tablet by mouth daily.  Dispense: 90 tablet; Refill: 3 - CBC with Differential/Platelet - CMP14+EGFR  3. Mixed hyperlipidemia Low fat diet - Lipid panel  4. Paroxysmal atrial fibrillation (HCC) Avoid caffeine - metoprolol tartrate (LOPRESSOR) 50 MG tablet; Take 1 tablet (50 mg total) by mouth 2 (two) times daily.  Dispense: 180 tablet; Refill: 3  5. GAD (generalized anxiety disorder) Stress management - PARoxetine (PAXIL) 10 MG tablet; Take 0.5 tablets (5 mg total) by mouth 2 (two) times daily.  Dispense: 90 tablet; Refill: 3  6. Elevated prostate specific antigen (PSA) His urologist is taking car eof this  7. BMI  32.0-32.9,adult Discussed diet and exercise for person with BMI >25 Will recheck weight in 3-6 months   Labs pending Health Maintenance reviewed Diet and exercise encouraged  Follow up plan: 6 months   Mary-Margaret Hassell Done, FNP

## 2020-08-10 NOTE — Patient Instructions (Signed)
Fall Prevention in the Home, Adult Falls can cause injuries and can happen to people of all ages. There are many things you can do to make your home safe and to help prevent falls. Ask for help when making these changes. What actions can I take to prevent falls? General Instructions  Use good lighting in all rooms. Replace any light bulbs that burn out.  Turn on the lights in dark areas. Use night-lights.  Keep items that you use often in easy-to-reach places. Lower the shelves around your home if needed.  Set up your furniture so you have a clear path. Avoid moving your furniture around.  Do not have throw rugs or other things on the floor that can make you trip.  Avoid walking on wet floors.  If any of your floors are uneven, fix them.  Add color or contrast paint or tape to clearly mark and help you see: ? Grab bars or handrails. ? First and last steps of staircases. ? Where the edge of each step is.  If you use a stepladder: ? Make sure that it is fully opened. Do not climb a closed stepladder. ? Make sure the sides of the stepladder are locked in place. ? Ask someone to hold the stepladder while you use it.  Know where your pets are when moving through your home. What can I do in the bathroom?  Keep the floor dry. Clean up any water on the floor right away.  Remove soap buildup in the tub or shower.  Use nonskid mats or decals on the floor of the tub or shower.  Attach bath mats securely with double-sided, nonslip rug tape.  If you need to sit down in the shower, use a plastic, nonslip stool.  Install grab bars by the toilet and in the tub and shower. Do not use towel bars as grab bars.      What can I do in the bedroom?  Make sure that you have a light by your bed that is easy to reach.  Do not use any sheets or blankets for your bed that hang to the floor.  Have a firm chair with side arms that you can use for support when you get dressed. What can I do in  the kitchen?  Clean up any spills right away.  If you need to reach something above you, use a step stool with a grab bar.  Keep electrical cords out of the way.  Do not use floor polish or wax that makes floors slippery. What can I do with my stairs?  Do not leave any items on the stairs.  Make sure that you have a light switch at the top and the bottom of the stairs.  Make sure that there are handrails on both sides of the stairs. Fix handrails that are broken or loose.  Install nonslip stair treads on all your stairs.  Avoid having throw rugs at the top or bottom of the stairs.  Choose a carpet that does not hide the edge of the steps on the stairs.  Check carpeting to make sure that it is firmly attached to the stairs. Fix carpet that is loose or worn. What can I do on the outside of my home?  Use bright outdoor lighting.  Fix the edges of walkways and driveways and fix any cracks.  Remove anything that might make you trip as you walk through a door, such as a raised step or threshold.  Trim any   bushes or trees on paths to your home.  Check to see if handrails are loose or broken and that both sides of all steps have handrails.  Install guardrails along the edges of any raised decks and porches.  Clear paths of anything that can make you trip, such as tools or rocks.  Have leaves, snow, or ice cleared regularly.  Use sand or salt on paths during winter.  Clean up any spills in your garage right away. This includes grease or oil spills. What other actions can I take?  Wear shoes that: ? Have a low heel. Do not wear high heels. ? Have rubber bottoms. ? Feel good on your feet and fit well. ? Are closed at the toe. Do not wear open-toe sandals.  Use tools that help you move around if needed. These include: ? Canes. ? Walkers. ? Scooters. ? Crutches.  Review your medicines with your doctor. Some medicines can make you feel dizzy. This can increase your chance  of falling. Ask your doctor what else you can do to help prevent falls. Where to find more information  Centers for Disease Control and Prevention, [employer]AD[employer]: www.cdc.gov  National Institute on Aging: www.nia.nih.gov Contact a doctor if:  You are afraid of falling at home.  You feel weak, drowsy, or dizzy at home.  You fall at home. Summary  There are many simple things that you can do to make your home safe and to help prevent falls.  Ways to make your home safe include removing things that can make you trip and installing grab bars in the bathroom.  Ask for help when making these changes in your home. This information is not intended to replace advice given to you by your health care provider. Make sure you discuss any questions you have with your health care provider. Document Revised: 02/16/2020 Document Reviewed: 02/16/2020 Elsevier Patient Education  2021 Elsevier Inc.  

## 2020-08-15 NOTE — Progress Notes (Signed)
Cardiology Office Note   Date:  08/16/2020   ID:  Justin Gibbs, DOB October 29, 1940, MRN [id number]  PCP:  Chevis Pretty, FNP  Cardiologist:   Minus Breeding, MD Referring:  Chevis Pretty, FNP  Chief Complaint  Patient presents with  . Fatigue      History of Present Illness: Justin Gibbs is a 80 y.o. male who is referred for evaluation of atrial fib.  He had problems with nose bleeding.   He had a bleeding site cauterized by ENT.   He reports fatigue but otherwise has been feeling OK.  He says that he is getting shortness of breath when he does something like walking or up an incline from the building back to your 175 feet to the mailbox.  He is not describing PND or orthopnea.  He does have fatigue when he walks up and down the stairs every couple of hours to [place] in the basement.  He tires with this.  All of the symptoms he is has are basically unchanged from previous.  He is not having any palpitations, presyncope or syncope.  He denies any chest pressure, neck or arm discomfort.  He has had no weight gain and actually has lost a little weight.    Past Medical History:  Diagnosis Date  . Cancer [facility])    prostate / removed 2006  . Depression   . High triglycerides   . Hypertension     Past Surgical History:  Procedure Laterality Date  . HERNIA REPAIR Bilateral    x 2   . PROSTATECTOMY       Current Outpatient Medications  Medication Sig Dispense Refill  . apixaban (ELIQUIS) 5 MG TABS tablet Take 1 tablet (5 mg total) by mouth 2 (two) times daily. 60 tablet 11  . lisinopril-hydrochlorothiazide (ZESTORETIC) 20-12.5 MG tablet Take 2 tablets by mouth daily. 60 tablet 11  . metoprolol tartrate (LOPRESSOR) 25 MG tablet Take 1 tablet (25 mg total) by mouth 2 (two) times daily. 60 tablet 11  . Multiple Vitamin (MULTIVITAMIN) capsule Take 1 capsule by mouth daily.    Marland Kitchen PARoxetine (PAXIL) 10 MG tablet Take 0.5 tablets (5 mg total) by mouth 2 (two) times  daily. 90 tablet 3   No current facility-administered medications for this visit.    Allergies:   Patient has no known allergies.    ROS:  Please see the history of present illness.   Otherwise, review of systems are positive for none.   All other systems are reviewed and negative.    PHYSICAL EXAM: VS:  BP (!) 162/88   Pulse (!) 57   Ht 5\' 9"  (1.753 m)   Wt 219 lb (99.3 kg)   SpO2 96%   BMI 32.34 kg/m  , BMI Body mass index is 32.34 kg/m. GENERAL:  Well appearing NECK:  No jugular venous distention, waveform within normal limits, carotid upstroke brisk and symmetric, no bruits, no thyromegaly LUNGS:  Clear to auscultation bilaterally CHEST:  Unremarkable HEART:  PMI not displaced or sustained,S1 and S2 within normal limits, no S3, no clicks, no rubs, no murmurs, irregular ABD:  Flat, positive bowel sounds normal in frequency in pitch, no bruits, no rebound, no guarding, no midline pulsatile mass, no hepatomegaly, no splenomegaly EXT:  2 plus pulses throughout, no edema, no cyanosis no clubbing   EKG:  EKG is  ordered today. Atrial fibrillation, rate, Axis within normal limits, intervals within normal limits, slow ventricular rate, nonspecific diffuse T wave  flattening  Recent Labs: 10/06/2019: ALT 22; BUN 25; Creatinine, Ser 1.09; Hemoglobin 16.3; Platelets 565; Potassium 4.7; Sodium 141; TSH 3.220    Lipid Panel    Component Value Date/Time   CHOL 144 10/06/2019 1030   TRIG 110 10/06/2019 1030   HDL 38 (L) 10/06/2019 1030   CHOLHDL 3.8 10/06/2019 1030   LDLCALC 86 10/06/2019 1030      Wt Readings from Last 3 Encounters:  08/16/20 219 lb (99.3 kg)  08/10/20 221 lb (100.2 kg)  02/08/20 219 lb (99.3 kg)      Other studies Reviewed: Additional studies/ records that were reviewed today include: None Review of the above records demonstrates:  NA   ASSESSMENT AND PLAN:  ATRIAL FIB:    Justin Gibbs has a CHA2DS2 - VASc score of 3.   His rate is slow.   Although his fatigue really has not changed since his heart rate seems to be a little lower I am going to reduce his beta-blocker.  He previously has had palpitations but he cannot describe.  I want to see if this helps with his shortness of breath and fatigue.  If it does not put his heart rate stays low I will likely discontinue it altogether.  We will go to 25 mg twice a day of metoprolol.  He does have blood work pending including CBC.  HTN:    His blood pressure is mildly elevated.  I am going to increase his lisinopril HCTZ to 40/25.  This will have to be given at 20/12-1/2 to 2 pills daily  AKI: He is due to have blood work checked.  Previously had an elevated creatinine came back to baseline.   COVID EDUCATION:    He has not yet happened foods.  We talked about getting this.   Current medicines are reviewed at length with the patient today.  The patient does not have concerns regarding medicines.  The following changes have been made: As above  Labs/ tests ordered today include: None  Orders Placed This Encounter  Procedures  . EKG 12-Lead      Disposition:   FU in 6 months.     Signed, Minus Breeding, MD  08/16/2020 10:20 AM    [facility]

## 2020-08-16 ENCOUNTER — Ambulatory Visit: Payer: Medicare Other | Admitting: Cardiology

## 2020-08-16 ENCOUNTER — Other Ambulatory Visit: Payer: Self-pay

## 2020-08-16 ENCOUNTER — Encounter: Payer: Self-pay | Admitting: Cardiology

## 2020-08-16 ENCOUNTER — Other Ambulatory Visit: Payer: Medicare Other

## 2020-08-16 VITALS — BP 162/88 | HR 57 | Ht 69.0 in | Wt 219.0 lb

## 2020-08-16 DIAGNOSIS — E782 Mixed hyperlipidemia: Secondary | ICD-10-CM | POA: Diagnosis not present

## 2020-08-16 DIAGNOSIS — I10 Essential (primary) hypertension: Secondary | ICD-10-CM | POA: Diagnosis not present

## 2020-08-16 DIAGNOSIS — I482 Chronic atrial fibrillation, unspecified: Secondary | ICD-10-CM

## 2020-08-16 LAB — CBC WITH DIFFERENTIAL/PLATELET
Basophils Absolute: 0.1 x10E3/uL (ref 0.0–0.2)
Basos: 1 %
EOS (ABSOLUTE): 0.6 x10E3/uL — ABNORMAL HIGH (ref 0.0–0.4)
Eos: 5 %
Hematocrit: 50.9 % (ref 37.5–51.0)
Hemoglobin: 16.7 g/dL (ref 13.0–17.7)
Immature Grans (Abs): 0.1 x10E3/uL (ref 0.0–0.1)
Immature Granulocytes: 1 %
Lymphocytes Absolute: 1.6 x10E3/uL (ref 0.7–3.1)
Lymphs: 13 %
MCH: 28.9 pg (ref 26.6–33.0)
MCHC: 32.8 g/dL (ref 31.5–35.7)
MCV: 88 fL (ref 79–97)
Monocytes Absolute: 0.9 x10E3/uL (ref 0.1–0.9)
Monocytes: 8 %
Neutrophils Absolute: 9.1 x10E3/uL — ABNORMAL HIGH (ref 1.4–7.0)
Neutrophils: 72 %
Platelets: 728 x10E3/uL — ABNORMAL HIGH (ref 150–450)
RBC: 5.77 x10E6/uL (ref 4.14–5.80)
RDW: 13 % (ref 11.6–15.4)
WBC: 12.4 x10E3/uL — ABNORMAL HIGH (ref 3.4–10.8)

## 2020-08-16 LAB — CMP14+EGFR
ALT: 21 IU/L (ref 0–44)
AST: 19 IU/L (ref 0–40)
Albumin/Globulin Ratio: 1.5 (ref 1.2–2.2)
Albumin: 4.2 g/dL (ref 3.7–4.7)
Alkaline Phosphatase: 63 IU/L (ref 44–121)
BUN/Creatinine Ratio: 22 (ref 10–24)
BUN: 29 mg/dL — ABNORMAL HIGH (ref 8–27)
Bilirubin Total: 0.7 mg/dL (ref 0.0–1.2)
CO2: 24 mmol/L (ref 20–29)
Calcium: 9.9 mg/dL (ref 8.6–10.2)
Chloride: 99 mmol/L (ref 96–106)
Creatinine, Ser: 1.34 mg/dL — ABNORMAL HIGH (ref 0.76–1.27)
GFR calc Af Amer: 59 mL/min/1.73 — ABNORMAL LOW (ref >59)
GFR calc non Af Amer: 51 mL/min/1.73 — ABNORMAL LOW (ref >59)
Globulin, Total: 2.8 g/dL (ref 1.5–4.5)
Glucose: 90 mg/dL (ref 65–99)
Potassium: 5 mmol/L (ref 3.5–5.2)
Sodium: 139 mmol/L (ref 134–144)
Total Protein: 7 g/dL (ref 6.0–8.5)

## 2020-08-16 LAB — LIPID PANEL
Chol/HDL Ratio: 4.6 ratio (ref 0.0–5.0)
Cholesterol, Total: 155 mg/dL (ref 100–199)
HDL: 34 mg/dL — ABNORMAL LOW (ref >39)
LDL Chol Calc (NIH): 98 mg/dL (ref 0–99)
Triglycerides: 128 mg/dL (ref 0–149)
VLDL Cholesterol Cal: 23 mg/dL (ref 5–40)

## 2020-08-16 MED ORDER — LISINOPRIL-HYDROCHLOROTHIAZIDE 20-12.5 MG PO TABS: 2.0000 | ORAL_TABLET | Freq: Every day | ORAL | 11 refills | Status: DC

## 2020-08-16 MED ORDER — METOPROLOL TARTRATE 25 MG PO TABS: 25.0000 mg | ORAL_TABLET | Freq: Two times a day (BID) | ORAL | 11 refills | Status: DC

## 2020-08-16 NOTE — Patient Instructions (Signed)
Medication Instructions:  Please decrease your Metoprolol tartrate to 25 mg twice a day. Take Lisinopril/HCTZ 20/12.5 mg (2) daily. Continue all other medications as listed.  *If you need a refill on your cardiac medications before your next appointment, please call your pharmacy*   Lab Work: Please have blood work as ordered at [facility].  If you have labs (blood work) drawn today and your tests are completely normal, you will receive your results only by: Marland Kitchen MyChart Message (if you have MyChart) OR . A paper copy in the mail If you have any lab test that is abnormal or we need to change your treatment, we will call you to review the results.  Follow-Up: At [facility], you and your health needs are our priority.  As part of our continuing mission to provide you with exceptional heart care, we have created designated Provider Care Teams.  These Care Teams include your primary Cardiologist (physician) and Advanced Practice Providers (APPs -  Physician Assistants and Nurse Practitioners) who all work together to provide you with the care you need, when you need it.  We recommend signing up for the patient portal called "MyChart".  Sign up information is provided on this After Visit Summary.  MyChart is used to connect with patients for Virtual Visits (Telemedicine).  Patients are able to view lab/test results, encounter notes, upcoming appointments, etc.  Non-urgent messages can be sent to your provider as well.   To learn more about what you can do with MyChart, go to [email or web address].    Your next appointment:   6 month(s)  The format for your next appointment:   In Person  Provider:   Minus Breeding, MD   Thank you for choosing [facility]!!

## 2020-08-18 ENCOUNTER — Other Ambulatory Visit: Payer: Self-pay | Admitting: Nurse Practitioner

## 2020-08-18 DIAGNOSIS — R7989 Other specified abnormal findings of blood chemistry: Secondary | ICD-10-CM

## 2020-08-18 NOTE — Progress Notes (Signed)
Ref ematology

## 2020-08-21 ENCOUNTER — Telehealth: Payer: Self-pay | Admitting: Hematology and Oncology

## 2020-08-21 NOTE — Telephone Encounter (Signed)
Received a new hem referral from [facility] for elevated platelet count. Justin Gibbs has been cld and scheduled to see Dr. Lorenso Courier on 2/2 at [facility]. Pt aware to arrive 30 minutes early.

## 2020-08-30 ENCOUNTER — Other Ambulatory Visit: Payer: Self-pay | Admitting: Hematology and Oncology

## 2020-08-30 ENCOUNTER — Inpatient Hospital Stay: Payer: Medicare Other | Attending: Hematology and Oncology | Admitting: Hematology and Oncology

## 2020-08-30 ENCOUNTER — Other Ambulatory Visit: Payer: Self-pay

## 2020-08-30 ENCOUNTER — Inpatient Hospital Stay: Payer: Medicare Other

## 2020-08-30 VITALS — BP 156/91 | HR 71 | Resp 17 | Ht 69.0 in | Wt 219.5 lb

## 2020-08-30 DIAGNOSIS — Z9079 Acquired absence of other genital organ(s): Secondary | ICD-10-CM | POA: Diagnosis not present

## 2020-08-30 DIAGNOSIS — Z833 Family history of diabetes mellitus: Secondary | ICD-10-CM | POA: Insufficient documentation

## 2020-08-30 DIAGNOSIS — Z8546 Personal history of malignant neoplasm of prostate: Secondary | ICD-10-CM | POA: Insufficient documentation

## 2020-08-30 DIAGNOSIS — Z79899 Other long term (current) drug therapy: Secondary | ICD-10-CM | POA: Insufficient documentation

## 2020-08-30 DIAGNOSIS — E785 Hyperlipidemia, unspecified: Secondary | ICD-10-CM | POA: Insufficient documentation

## 2020-08-30 DIAGNOSIS — D751 Secondary polycythemia: Secondary | ICD-10-CM | POA: Insufficient documentation

## 2020-08-30 DIAGNOSIS — Z7901 Long term (current) use of anticoagulants: Secondary | ICD-10-CM | POA: Diagnosis not present

## 2020-08-30 DIAGNOSIS — F32A Depression, unspecified: Secondary | ICD-10-CM | POA: Insufficient documentation

## 2020-08-30 DIAGNOSIS — D72825 Bandemia: Secondary | ICD-10-CM

## 2020-08-30 DIAGNOSIS — D75839 Thrombocytosis, unspecified: Secondary | ICD-10-CM

## 2020-08-30 DIAGNOSIS — I10 Essential (primary) hypertension: Secondary | ICD-10-CM | POA: Diagnosis not present

## 2020-08-30 DIAGNOSIS — Z87891 Personal history of nicotine dependence: Secondary | ICD-10-CM | POA: Insufficient documentation

## 2020-08-30 DIAGNOSIS — R7989 Other specified abnormal findings of blood chemistry: Secondary | ICD-10-CM | POA: Insufficient documentation

## 2020-08-30 DIAGNOSIS — D72829 Elevated white blood cell count, unspecified: Secondary | ICD-10-CM | POA: Diagnosis not present

## 2020-08-30 DIAGNOSIS — D45 Polycythemia vera: Secondary | ICD-10-CM

## 2020-08-30 DIAGNOSIS — Z806 Family history of leukemia: Secondary | ICD-10-CM | POA: Insufficient documentation

## 2020-08-30 LAB — FERRITIN: Ferritin: 193 ng/mL (ref 24–336)

## 2020-08-30 LAB — CBC WITH DIFFERENTIAL (CANCER CENTER ONLY)
Abs Immature Granulocytes: 0.05 10*3/uL (ref 0.00–0.07)
Basophils Absolute: 0.1 10*3/uL (ref 0.0–0.1)
Basophils Relative: 1 %
Eosinophils Absolute: 0.4 10*3/uL (ref 0.0–0.5)
Eosinophils Relative: 3 %
HCT: 51.9 % (ref 39.0–52.0)
Hemoglobin: 17.4 g/dL — ABNORMAL HIGH (ref 13.0–17.0)
Immature Granulocytes: 0 %
Lymphocytes Relative: 11 %
Lymphs Abs: 1.4 10*3/uL (ref 0.7–4.0)
MCH: 29.3 pg (ref 26.0–34.0)
MCHC: 33.5 g/dL (ref 30.0–36.0)
MCV: 87.5 fL (ref 80.0–100.0)
Monocytes Absolute: 0.8 10*3/uL (ref 0.1–1.0)
Monocytes Relative: 6 %
Neutro Abs: 9.9 10*3/uL — ABNORMAL HIGH (ref 1.7–7.7)
Neutrophils Relative %: 79 %
Platelet Count: 631 10*3/uL — ABNORMAL HIGH (ref 150–400)
RBC: 5.93 MIL/uL — ABNORMAL HIGH (ref 4.22–5.81)
RDW: 14 % (ref 11.5–15.5)
WBC Count: 12.7 10*3/uL — ABNORMAL HIGH (ref 4.0–10.5)
nRBC: 0 % (ref 0.0–0.2)

## 2020-08-30 LAB — CMP (CANCER CENTER ONLY)
ALT: 34 U/L (ref 0–44)
AST: 25 U/L (ref 15–41)
Albumin: 4.1 g/dL (ref 3.5–5.0)
Alkaline Phosphatase: 52 U/L (ref 38–126)
Anion gap: 8 (ref 5–15)
BUN: 24 mg/dL — ABNORMAL HIGH (ref 8–23)
CO2: 30 mmol/L (ref 22–32)
Calcium: 9.7 mg/dL (ref 8.9–10.3)
Chloride: 102 mmol/L (ref 98–111)
Creatinine: 1.31 mg/dL — ABNORMAL HIGH (ref 0.61–1.24)
GFR, Estimated: 56 mL/min — ABNORMAL LOW (ref >60)
Glucose, Bld: 105 mg/dL — ABNORMAL HIGH (ref 70–99)
Potassium: 3.8 mmol/L (ref 3.5–5.1)
Sodium: 140 mmol/L (ref 135–145)
Total Bilirubin: 0.7 mg/dL (ref 0.3–1.2)
Total Protein: 7.5 g/dL (ref 6.5–8.1)

## 2020-08-30 LAB — RETIC PANEL
Immature Retic Fract: 8.5 % (ref 2.3–15.9)
RBC.: 5.84 MIL/uL — ABNORMAL HIGH (ref 4.22–5.81)
Retic Count, Absolute: 72.4 10*3/uL (ref 19.0–186.0)
Retic Ct Pct: 1.2 % (ref 0.4–3.1)
Reticulocyte Hemoglobin: 34.6 pg (ref >27.9)

## 2020-08-30 LAB — SAVE SMEAR(SSMR), FOR PROVIDER SLIDE REVIEW

## 2020-08-30 LAB — SEDIMENTATION RATE: Sed Rate: 1 mm/hr (ref 0–16)

## 2020-08-30 LAB — IRON AND TIBC
Iron: 105 ug/dL (ref 42–163)
Saturation Ratios: 33 % (ref 20–55)
TIBC: 318 ug/dL (ref 202–409)
UIBC: 212 ug/dL (ref 117–376)

## 2020-08-30 LAB — C-REACTIVE PROTEIN: CRP: 0.8 mg/dL (ref <1.0)

## 2020-08-31 ENCOUNTER — Encounter: Payer: Self-pay | Admitting: Hematology and Oncology

## 2020-08-31 NOTE — Progress Notes (Signed)
[place] [phone]) [phone]   Fax:([phone]) [place] NOTE  Patient Care Team: Chevis Pretty, FNP as PCP - General (Family Medicine) Minus Breeding, MD as PCP - Cardiology (Cardiology)  Hematological/Oncological History # Polycythemia # Thrombocytosis # Leukocytosis 06/18/2019: WBC 12.8, Hgb 15.1, MCV 90, Plt 615 07/05/2019: WBC 10.5, Hgb 15.8, MCV 87, Plt 644 10/06/2019: WBC 10.6, Hgb 16.3, MCV 85, Plt 565 08/30/2020: establish care with Dr. Lorenso Courier   #Prostate Cancer 2006: robotic resection of prostate.  11/16/2019: PSA 0.21([facility])  08/08/2020: PSA 0.36 [facility])   CHIEF COMPLAINTS/PURPOSE OF CONSULTATION:  "polycythemia "  HISTORY OF PRESENTING ILLNESS:  Justin Gibbs 80 y.o. male with medical history significant for prostate cancer s/p robotic prostate resection in 2006, HTN, depression, and HLD who presents for evaluation of elevated blood counts.   On review of the previous records Justin Gibbs has a thrombocytosis dating back to at least 06/18/2019.  At that time he was noted to have a platelet count of 615 with a hemoglobin of 15.1 and a white blood cell count of 12.8.  He has consistently elevated platelets and sporadically elevated white blood cell counts.  Most recently on 08/30/2020 he was also noted to have an increase in hemoglobin of 17.4 with a white blood cell count 12.7 and a platelet count of 631.  Due to concern for the patient's elevated counts the he was referred to hematology for further evaluation and management.  On exam today Justin Gibbs is accompanied by his wife.  Justin Gibbs reports that he had a prostatectomy performed in 2006 and is concerned that his PSA has begun creeping up since that time.  He notes that his most recent PSA was 0.36 up from 0.2 when the 0.2 took him 15 years to reach.  He also notes that he has talked with radiation oncology at [facility] but is most interested in receiving radiation therapy here if it  is necessary.  On further discussion Justin Gibbs notes that he is a former smoker and quit in 1983.  He smoked from ages 74 up until that time.  He denies having any history of military service and denies ever having a splenectomy.  His family history is remarkable for prostate cancer in his brother and some form of cancer in his father (he is not sure).  His other cancer screenings are up-to-date with a colonoscopy last performed in 2012.  He currently denies any fevers, chills, sweats, nausea, vomiting or diarrhea.  A full 10 point ROS is listed below.  MEDICAL HISTORY:  Past Medical History:  Diagnosis Date  . Cancer [facility])    prostate / removed 2006  . Depression   . High triglycerides   . Hypertension     SURGICAL HISTORY: Past Surgical History:  Procedure Laterality Date  . HERNIA REPAIR Bilateral    x 2   . PROSTATECTOMY      SOCIAL HISTORY: Social History   Socioeconomic History  . Marital status: Married    Spouse name: Dawn  . Number of children: 3  . Years of education: Not on file  . Highest education level: Not on file  Occupational History  . Occupation: retired     Comment: 2007  Tobacco Use  . Smoking status: Former Research scientist (life sciences)  . Smokeless tobacco: Never Used  Vaping Use  . Vaping Use: Never used  Substance and Sexual Activity  . Alcohol use: Never  . Drug use: Never  . Sexual activity: Not  on file  Other Topics Concern  . Not on file  Social History Narrative   Lives wife.  Son.     Social Determinants of Health   Financial Resource Strain: Not on file  Food Insecurity: Not on file  Transportation Needs: Not on file  Physical Activity: Not on file  Stress: Not on file  Social Connections: Not on file  Intimate Partner Violence: Not on file    FAMILY HISTORY: Family History  Problem Relation Age of Onset  . Alzheimer's disease Mother   . Alcohol abuse Father   . Cancer Father        leukemia  . Cirrhosis Father   . Stroke Brother   .  Diabetes Daughter     ALLERGIES:  has No Known Allergies.  MEDICATIONS:  Current Outpatient Medications  Medication Sig Dispense Refill  . apixaban (ELIQUIS) 5 MG TABS tablet Take 1 tablet (5 mg total) by mouth 2 (two) times daily. 60 tablet 11  . lisinopril-hydrochlorothiazide (ZESTORETIC) 20-12.5 MG tablet Take 2 tablets by mouth daily. 60 tablet 11  . metoprolol tartrate (LOPRESSOR) 25 MG tablet Take 1 tablet (25 mg total) by mouth 2 (two) times daily. 60 tablet 11  . Multiple Vitamin (MULTIVITAMIN) capsule Take 1 capsule by mouth daily.    Marland Kitchen PARoxetine (PAXIL) 10 MG tablet Take 0.5 tablets (5 mg total) by mouth 2 (two) times daily. 90 tablet 3   No current facility-administered medications for this visit.    REVIEW OF SYSTEMS:   Constitutional: ( - ) fevers, ( - )  chills , ( - ) night sweats Eyes: ( - ) blurriness of vision, ( - ) double vision, ( - ) watery eyes Ears, nose, mouth, throat, and face: ( - ) mucositis, ( - ) sore throat Respiratory: ( - ) cough, ( - ) dyspnea, ( - ) wheezes Cardiovascular: ( - ) palpitation, ( - ) chest discomfort, ( - ) lower extremity swelling Gastrointestinal:  ( - ) nausea, ( - ) heartburn, ( - ) change in bowel habits Skin: ( - ) abnormal skin rashes Lymphatics: ( - ) new lymphadenopathy, ( - ) easy bruising Neurological: ( - ) numbness, ( - ) tingling, ( - ) new weaknesses Behavioral/Psych: ( - ) mood change, ( - ) new changes  All other systems were reviewed with the patient and are negative.  PHYSICAL EXAMINATION: ECOG PERFORMANCE STATUS: 1 - Symptomatic but completely ambulatory  Vitals:   08/30/20 0846  BP: (!) 156/91  Pulse: 71  Resp: 17   Filed Weights   08/30/20 0846  Weight: 219 lb 8 oz (99.6 kg)    GENERAL: well appearing elderly Caucasian male in NAD  SKIN: skin color, texture, turgor are normal, no rashes or significant lesions EYES: conjunctiva are pink and non-injected, sclera clear LUNGS: clear to auscultation and  percussion with normal breathing effort HEART: regular rate & rhythm and no murmurs and no lower extremity edema Musculoskeletal: no cyanosis of digits and no clubbing  PSYCH: alert & oriented x 3, fluent speech NEURO: no focal motor/sensory deficits  LABORATORY DATA:  I have reviewed the data as listed CBC Latest Ref Rng & Units 08/30/2020 08/16/2020 10/06/2019  WBC 4.0 - 10.5 K/uL 12.7(H) 12.4(H) 10.6  Hemoglobin 13.0 - 17.0 g/dL 17.4(H) 16.7 16.3  Hematocrit 39.0 - 52.0 % 51.9 50.9 48.9  Platelets 150 - 400 K/uL 631(H) 728(H) 565(H)    CMP Latest Ref Rng & Units 08/30/2020 08/16/2020 10/06/2019  Glucose 70 - 99 mg/dL 105(H) 90 75  BUN 8 - 23 mg/dL 24(H) 29(H) 25  Creatinine 0.61 - 1.24 mg/dL 1.31(H) 1.34(H) 1.09  Sodium 135 - 145 mmol/L 140 139 141  Potassium 3.5 - 5.1 mmol/L 3.8 5.0 4.7  Chloride 98 - 111 mmol/L 102 99 100  CO2 22 - 32 mmol/L 30 24 26   Calcium 8.9 - 10.3 mg/dL 9.7 9.9 9.4  Total Protein 6.5 - 8.1 g/dL 7.5 7.0 6.8  Total Bilirubin 0.3 - 1.2 mg/dL 0.7 0.7 0.7  Alkaline Phos 38 - 126 U/L 52 63 62  AST 15 - 41 U/L 25 19 22   ALT 0 - 44 U/L 34 21 22    RADIOGRAPHIC STUDIES: No results found.  ASSESSMENT & PLAN JAMIEL GONCALVES 80 y.o. male with medical history significant for prostate cancer s/p robotic prostate resection in 2006, HTN, depression, and HLD who presents for evaluation of elevated blood counts.   After review the labs, the records, discussion with the patient the findings are most consistent with an elevation in all 3 cell lines.  He has a polycythemia, thrombocytosis, leukocytosis.  The thrombocytosis is the most longstanding and the leukocytosis has been sporadic.  Given these findings I am most concerned for myeloproliferative neoplasm.  In order to effectively rule this out we will order a JAK2 with reflex as well as a BCR able fish.  Alternative etiologies could include inflammation iron deficiency we will check for these as well.  In the event the  patient is found to have a mutation concerning for an MPN we will proceed with a bone marrow biopsy.  We will put an interval visit in for approximately 6 weeks time.  In the event that there is no clear etiology for this patient's findings we would need to consider alternative etiologies such as obstructive sleep apnea.  We will start with the work-up as listed below and proceed from there.  # Polycythemia # Thrombocytosis # Leukocytosis --findings are concerning for an MPN. Will order JAK2 w/ reflex and BCR/ABL FISH --additionally will collect ESR/CRP and iron panel to r/o inflammatory or iron deficiency as etiologies --if concern for MPN based off genetic profiles will order a bone marrow biopsy and discuss cytoreductive therapy --RTC in approximately 6 weeks time (placeholder presuming a bone marrow is required)   No orders of the defined types were placed in this encounter.   All questions were answered. The patient knows to call the clinic with any problems, questions or concerns.  A total of more than 60 minutes were spent on this encounter and over half of that time was spent on counseling and coordination of care as outlined above.   Ledell Peoples, MD Department of Hematology/Oncology [place] at [facility] Phone: [phone] Pager: [phone] Email: [email or web address]  08/31/2020 2:26 PM

## 2020-09-01 ENCOUNTER — Telehealth: Payer: Self-pay | Admitting: Hematology and Oncology

## 2020-09-01 NOTE — Telephone Encounter (Signed)
Scheduled per 2/2 los. Called and spoke with pt, confirmed 3/16 appts

## 2020-09-06 LAB — JAK2 (INCLUDING V617F AND EXON 12), MPL,& CALR W/RFL MPN PANEL (NGS)

## 2020-09-06 LAB — BCR ABL1 FISH (GENPATH)

## 2020-09-14 ENCOUNTER — Encounter: Payer: Self-pay | Admitting: Hematology and Oncology

## 2020-09-22 ENCOUNTER — Telehealth: Payer: Self-pay | Admitting: Hematology and Oncology

## 2020-09-22 DIAGNOSIS — D75839 Thrombocytosis, unspecified: Secondary | ICD-10-CM

## 2020-09-22 NOTE — Telephone Encounter (Signed)
Called Mr. Justin Gibbs to discuss the results of his blood work..  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  In order to confirm the diagnosis we will need to set the patient up for a bone marrow biopsy and aspiration.  The order has been placed for this today.  We will plan to see the patient back in approximately 2 weeks time in order to review the results of the bone marrow.  The patient voices understanding of this plan moving forward.  Ledell Peoples, MD Department of Hematology/Oncology [place] at [facility] Phone: [phone] Pager: [phone] Email: [email or web address]

## 2020-10-05 ENCOUNTER — Other Ambulatory Visit: Payer: Self-pay | Admitting: Radiology

## 2020-10-06 ENCOUNTER — Encounter (HOSPITAL_COMMUNITY): Payer: Self-pay

## 2020-10-06 ENCOUNTER — Ambulatory Visit (HOSPITAL_COMMUNITY)
Admission: RE | Admit: 2020-10-06 | Discharge: 2020-10-06 | Disposition: A | Discharge status: Home / Self Care | Payer: Medicare Other | Source: Ambulatory Visit | Attending: Hematology and Oncology | Admitting: Hematology and Oncology

## 2020-10-06 ENCOUNTER — Other Ambulatory Visit: Payer: Self-pay

## 2020-10-06 DIAGNOSIS — D75839 Thrombocytosis, unspecified: Secondary | ICD-10-CM | POA: Insufficient documentation

## 2020-10-06 DIAGNOSIS — Z87891 Personal history of nicotine dependence: Secondary | ICD-10-CM | POA: Diagnosis not present

## 2020-10-06 DIAGNOSIS — Z79899 Other long term (current) drug therapy: Secondary | ICD-10-CM | POA: Insufficient documentation

## 2020-10-06 DIAGNOSIS — Z8546 Personal history of malignant neoplasm of prostate: Secondary | ICD-10-CM | POA: Diagnosis not present

## 2020-10-06 DIAGNOSIS — I10 Essential (primary) hypertension: Secondary | ICD-10-CM | POA: Diagnosis not present

## 2020-10-06 DIAGNOSIS — Z7901 Long term (current) use of anticoagulants: Secondary | ICD-10-CM | POA: Diagnosis not present

## 2020-10-06 DIAGNOSIS — D72829 Elevated white blood cell count, unspecified: Secondary | ICD-10-CM | POA: Diagnosis not present

## 2020-10-06 DIAGNOSIS — C946 Myelodysplastic disease, not classified: Secondary | ICD-10-CM | POA: Diagnosis not present

## 2020-10-06 DIAGNOSIS — D751 Secondary polycythemia: Secondary | ICD-10-CM | POA: Diagnosis not present

## 2020-10-06 DIAGNOSIS — I4891 Unspecified atrial fibrillation: Secondary | ICD-10-CM | POA: Diagnosis not present

## 2020-10-06 DIAGNOSIS — F32A Depression, unspecified: Secondary | ICD-10-CM | POA: Diagnosis not present

## 2020-10-06 DIAGNOSIS — D45 Polycythemia vera: Secondary | ICD-10-CM | POA: Diagnosis not present

## 2020-10-06 LAB — CBC WITH DIFFERENTIAL/PLATELET
Abs Immature Granulocytes: 0.06 10*3/uL (ref 0.00–0.07)
Basophils Absolute: 0.2 10*3/uL — ABNORMAL HIGH (ref 0.0–0.1)
Basophils Relative: 1 %
Eosinophils Absolute: 0.5 10*3/uL (ref 0.0–0.5)
Eosinophils Relative: 3 %
HCT: 51.6 % (ref 39.0–52.0)
Hemoglobin: 17.6 g/dL — ABNORMAL HIGH (ref 13.0–17.0)
Immature Granulocytes: 0 %
Lymphocytes Relative: 17 %
Lymphs Abs: 2.4 10*3/uL (ref 0.7–4.0)
MCH: 30.1 pg (ref 26.0–34.0)
MCHC: 34.1 g/dL (ref 30.0–36.0)
MCV: 88.2 fL (ref 80.0–100.0)
Monocytes Absolute: 1 10*3/uL (ref 0.1–1.0)
Monocytes Relative: 7 %
Neutro Abs: 9.9 10*3/uL — ABNORMAL HIGH (ref 1.7–7.7)
Neutrophils Relative %: 72 %
Platelets: 605 10*3/uL — ABNORMAL HIGH (ref 150–400)
RBC: 5.85 MIL/uL — ABNORMAL HIGH (ref 4.22–5.81)
RDW: 14.8 % (ref 11.5–15.5)
WBC: 14 10*3/uL — ABNORMAL HIGH (ref 4.0–10.5)
nRBC: 0 % (ref 0.0–0.2)

## 2020-10-06 LAB — PROTIME-INR
INR: 1.3 — ABNORMAL HIGH (ref 0.8–1.2)
Prothrombin Time: 15.7 seconds — ABNORMAL HIGH (ref 11.4–15.2)

## 2020-10-06 MED ORDER — FENTANYL CITRATE (PF) 100 MCG/2ML IJ SOLN
INTRAMUSCULAR | Status: AC | PRN
  Administered 2020-10-06 (×2): 50 ug via INTRAVENOUS

## 2020-10-06 MED ORDER — MIDAZOLAM HCL 2 MG/2ML IJ SOLN
INTRAMUSCULAR | Status: AC
  Filled 2020-10-06: qty 2

## 2020-10-06 MED ORDER — HYDROCODONE-ACETAMINOPHEN 5-325 MG PO TABS
1.0000 | ORAL_TABLET | ORAL | Status: DC | PRN
Start: 2020-10-06 — End: 2020-10-07

## 2020-10-06 MED ORDER — FENTANYL CITRATE (PF) 100 MCG/2ML IJ SOLN
INTRAMUSCULAR | Status: AC
  Filled 2020-10-06: qty 2

## 2020-10-06 MED ORDER — SODIUM CHLORIDE 0.9 % IV SOLN: INTRAVENOUS | Status: DC

## 2020-10-06 MED ORDER — LIDOCAINE HCL (PF) 1 % IJ SOLN
INTRAMUSCULAR | Status: AC | PRN
  Administered 2020-10-06: 10 mL via INTRADERMAL

## 2020-10-06 MED ORDER — MIDAZOLAM HCL 2 MG/2ML IJ SOLN
INTRAMUSCULAR | Status: AC | PRN
  Administered 2020-10-06 (×2): 1 mg via INTRAVENOUS

## 2020-10-06 MED ORDER — NALOXONE HCL 0.4 MG/ML IJ SOLN
INTRAMUSCULAR | Status: AC
  Filled 2020-10-06: qty 1

## 2020-10-06 MED ORDER — FLUMAZENIL 0.5 MG/5ML IV SOLN
INTRAVENOUS | Status: AC
  Filled 2020-10-06: qty 5

## 2020-10-06 NOTE — Discharge Instructions (Signed)
Please call Interventional Radiology clinic [phone] with any questions or concerns.  You may remove your dressing and shower tomorrow.   Bone Marrow Aspiration and Bone Marrow Biopsy, Adult, Care After This sheet gives you information about how to care for yourself after your procedure. Your health care provider may also give you more specific instructions. If you have problems or questions, contact your health care provider. What can I expect after the procedure? After the procedure, it is common to have:  Mild pain and tenderness.  Swelling.  Bruising. Follow these instructions at home: Puncture site care  Follow instructions from your health care provider about how to take care of the puncture site. Make sure you: ? Wash your hands with soap and water before and after you change your bandage (dressing). If soap and water are not available, use hand sanitizer. ? Change your dressing as told by your health care provider.  Check your puncture site every day for signs of infection. Check for: ? More redness, swelling, or pain. ? Fluid or blood. ? Warmth. ? Pus or a bad smell.   Activity  Return to your normal activities as told by your health care provider. Ask your health care provider what activities are safe for you.  Do not lift anything that is heavier than 10 lb (4.5 kg), or the limit that you are told, until your health care provider says that it is safe.  Do not drive for 24 hours if you were given a sedative during your procedure. General instructions  Take over-the-counter and prescription medicines only as told by your health care provider.  Do not take baths, swim, or use a hot tub until your health care provider approves. Ask your health care provider if you may take showers. You may only be allowed to take sponge baths.  If directed, put ice on the affected area. To do this: ? Put ice in a plastic bag. ? Place a towel between your skin and the bag. ? Leave  the ice on for 20 minutes, 2-3 times a day.  Keep all follow-up visits as told by your health care provider. This is important.   Contact a health care provider if:  Your pain is not controlled with medicine.  You have a fever.  You have more redness, swelling, or pain around the puncture site.  You have fluid or blood coming from the puncture site.  Your puncture site feels warm to the touch.  You have pus or a bad smell coming from the puncture site. Summary  After the procedure, it is common to have mild pain, tenderness, swelling, and bruising.  Follow instructions from your health care provider about how to take care of the puncture site and what activities are safe for you.  Take over-the-counter and prescription medicines only as told by your health care provider.  Contact a health care provider if you have any signs of infection, such as fluid or blood coming from the puncture site. This information is not intended to replace advice given to you by your health care provider. Make sure you discuss any questions you have with your health care provider. Document Revised: 12/01/2018 Document Reviewed: 12/01/2018 Elsevier Patient Education  2021 [employer].   Moderate Conscious Sedation, Adult, Care After This sheet gives you information about how to care for yourself after your procedure. Your health care provider may also give you more specific instructions. If you have problems or questions, contact your health care provider. What  can I expect after the procedure? After the procedure, it is common to have:  Sleepiness for several hours.  Impaired judgment for several hours.  Difficulty with balance.  Vomiting if you eat too soon. Follow these instructions at home: For the time period you were told by your health care provider:  Rest.  Do not participate in activities where you could fall or become injured.  Do not drive or use machinery.  Do not drink  alcohol.  Do not take sleeping pills or medicines that cause drowsiness.  Do not make important decisions or sign legal documents.  Do not take care of children on your own.      Eating and drinking  Follow the diet recommended by your health care provider.  Drink enough fluid to keep your urine pale yellow.  If you vomit: ? Drink water, juice, or soup when you can drink without vomiting. ? Make sure you have little or no nausea before eating solid foods.   General instructions  Take over-the-counter and prescription medicines only as told by your health care provider.  Have a responsible adult stay with you for the time you are told. It is important to have someone help care for you until you are awake and alert.  Do not smoke.  Keep all follow-up visits as told by your health care provider. This is important. Contact a health care provider if:  You are still sleepy or having trouble with balance after 24 hours.  You feel light-headed.  You keep feeling nauseous or you keep vomiting.  You develop a rash.  You have a fever.  You have redness or swelling around the IV site. Get help right away if:  You have trouble breathing.  You have new-onset confusion at home. Summary  After the procedure, it is common to feel sleepy, have impaired judgment, or feel nauseous if you eat too soon.  Rest after you get home. Know the things you should not do after the procedure.  Follow the diet recommended by your health care provider and drink enough fluid to keep your urine pale yellow.  Get help right away if you have trouble breathing or new-onset confusion at home. This information is not intended to replace advice given to you by your health care provider. Make sure you discuss any questions you have with your health care provider. Document Revised: 11/12/2019 Document Reviewed: 06/10/2019 Elsevier Patient Education  2021 [employer].

## 2020-10-06 NOTE — Procedures (Signed)
Interventional Radiology Procedure Note  Procedure: CT guided aspirate and core biopsy of right iliac bone  Complications: None  Recommendations: - Bedrest supine x 1 hrs - Hydrocodone PRN  Pain - Follow biopsy results   Rodneisha Bonnet, MD   

## 2020-10-06 NOTE — Consult Note (Signed)
Chief Complaint: Patient was seen in consultation today for CT guided bone marrow biopsy  Referring Physician(s): Dorsey,John T IV  Supervising Physician: Ruthann Cancer  Patient Status: [facility] - Out-pt  History of Present Illness: Justin Gibbs is a 80 y.o. male with past medical history of prostate cancer in 2006, hypertension, depression, atrial fibrillation and now with polycythemia/thrombocytosis/leukocytosis and positive JAK2 mutation.  He presents today for CT-guided bone marrow biopsy to rule out myeloproliferative neoplasm.  Past Medical History:  Diagnosis Date  . Cancer [facility])    prostate / removed 2006  . Depression   . High triglycerides   . Hypertension     Past Surgical History:  Procedure Laterality Date  . HERNIA REPAIR Bilateral    x 2   . PROSTATECTOMY      Allergies: Patient has no known allergies.  Medications: Prior to Admission medications   Medication Sig Start Date End Date Taking? Authorizing Provider  apixaban (ELIQUIS) 5 MG TABS tablet Take 1 tablet (5 mg total) by mouth 2 (two) times daily. 08/10/20  Yes Martin, Mary-Margaret, FNP  lisinopril-hydrochlorothiazide (ZESTORETIC) 20-12.5 MG tablet Take 2 tablets by mouth daily. 08/16/20  Yes Minus Breeding, MD  metoprolol tartrate (LOPRESSOR) 25 MG tablet Take 1 tablet (25 mg total) by mouth 2 (two) times daily. 08/16/20  Yes Minus Breeding, MD  Multiple Vitamin (MULTIVITAMIN) capsule Take 1 capsule by mouth daily.   Yes [provider]  PARoxetine (PAXIL) 10 MG tablet Take 0.5 tablets (5 mg total) by mouth 2 (two) times daily. 08/10/20 11/08/20 Yes Hassell Done Mary-Margaret, FNP     Family History  Problem Relation Age of Onset  . Alzheimer's disease Mother   . Alcohol abuse Father   . Cancer Father        leukemia  . Cirrhosis Father   . Stroke Brother   . Diabetes Daughter     Social History   Socioeconomic History  . Marital status: Married    Spouse name: Dawn  . Number of  children: 3  . Years of education: Not on file  . Highest education level: Not on file  Occupational History  . Occupation: retired     Comment: 2007  Tobacco Use  . Smoking status: Former Research scientist (life sciences)  . Smokeless tobacco: Never Used  Vaping Use  . Vaping Use: Never used  Substance and Sexual Activity  . Alcohol use: Never  . Drug use: Never  . Sexual activity: Not on file  Other Topics Concern  . Not on file  Social History Narrative   Lives wife.  Son.     Social Determinants of Health   Financial Resource Strain: Not on file  Food Insecurity: Not on file  Transportation Needs: Not on file  Physical Activity: Not on file  Stress: Not on file  Social Connections: Not on file      Review of Systems denies fever, chest pain, dyspnea, cough, abdominal pain, back pain, nausea, vomiting or bleeding.  Does have occasional headaches and is hard of hearing.  Vital Signs: BP (!) 154/88 (BP Location: Left Arm)   Pulse 78   Temp (!) 97.5 F (36.4 C) (Oral)   Resp 18   SpO2 98%   Physical Exam awake, alert.  Chest clear to auscultation bilaterally.  Heart with normal rate, irregular rhythm.  Abdomen soft, positive bowel sounds, nontender.  No lower extremity edema.  Imaging: No results found.  Labs:  CBC: Recent Labs    08/16/20 1038 08/30/20  1020 10/06/20 0730  WBC 12.4* 12.7* 14.0*  HGB 16.7 17.4* 17.6*  HCT 50.9 51.9 51.6  PLT 728* 631* 605*    COAGS: Recent Labs    10/06/20 0730  INR 1.3*    BMP: Recent Labs    08/16/20 1038 08/30/20 1020  NA 139 140  K 5.0 3.8  CL 99 102  CO2 24 30  GLUCOSE 90 105*  BUN 29* 24*  CALCIUM 9.9 9.7  CREATININE 1.34* 1.31*  GFRNONAA 51* 56*  GFRAA 59*  --     LIVER FUNCTION TESTS: Recent Labs    08/16/20 1038 08/30/20 1020  BILITOT 0.7 0.7  AST 19 25  ALT 21 34  ALKPHOS 63 52  PROT 7.0 7.5  ALBUMIN 4.2 4.1    TUMOR MARKERS: No results for input(s): AFPTM, CEA, CA199, CHROMGRNA in the last 8760  hours.  Assessment and Plan: 80 y.o. male with past medical history of prostate cancer in 2006, hypertension, depression, atrial fibrillation and now with polycythemia/thrombocytosis/leukocytosis and positive JAK2 mutation.  He presents today for CT-guided bone marrow biopsy to rule out myeloproliferative neoplasm.Risks and benefits of procedure was discussed with the patient  including, but not limited to bleeding, infection, damage to adjacent structures or low yield requiring additional tests.  All of the questions were answered and there is agreement to proceed.  Consent signed and in chart.     Thank you for this interesting consult.  I greatly enjoyed meeting Justin Gibbs and look forward to participating in their care.  A copy of this report was sent to the requesting provider on this date.  Electronically Signed: D. Rowe Sreekar, PA-C 10/06/2020, 8:19 AM   I spent a total of 20 minutes in face to face in clinical consultation, greater than 50% of which was counseling/coordinating care for CT-guided bone marrow biopsy

## 2020-10-10 NOTE — Progress Notes (Signed)
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 Lorenso Courier   #Hx of Prostate Cancer 2006: robotic resection of prostate.  11/16/2019: PSA 0.21([facility])  08/08/2020: PSA 0.36 [facility] [address])   Interval History:  Justin Gibbs 80 y.o. male with medical history significant for polycythemia vera who presents for a follow up visit. The patient's last visit was on 08/30/2020. In the interim since the last visit the patient had a bone marrow biopsy and genetic testing that confirm polycythemia vera.  On exam today Justin Gibbs reports has been well in the interim since her last visit.  He reports his bone marrow biopsy went well and he only had some mild bruising on his backside.  He is still little sore from this procedure.  Overall he has not had any bleeding, bruising, or dark stools.  He denies any itching on his body but does worry about the joint pain in his knees bilaterally.  He is prone to occasional headaches.  He notes he has not had any weakness, numbness, chest pain, shortness of breath, or lower extremity swelling.  A full 10 point ROS is listed below.  The bulk of our discussion focused on the diagnosis of polycythemia vera and the treatment options moving forward.  MEDICAL HISTORY:  Past Medical History:  Diagnosis Date  . Cancer [facility])    prostate / removed 2006  . Depression   . High triglycerides   . Hypertension     SURGICAL HISTORY: Past Surgical History:  Procedure Laterality Date  . HERNIA REPAIR Bilateral    x 2   .  PROSTATECTOMY      SOCIAL HISTORY: Social History   Socioeconomic History  . Marital status: Married    Spouse name: Dawn  . Number of children: 3  . Years of education: Not on file  . Highest education level: Not on file  Occupational History  . Occupation: retired     Comment: 2007  Tobacco Use  . Smoking status: Former Research scientist (life sciences)  . Smokeless tobacco: Never Used  Vaping Use  . Vaping Use: Never used  Substance and Sexual Activity  . Alcohol use: Never  . Drug use: Never  . Sexual activity: Not on file  Other Topics Concern  . Not on file  Social History Narrative   Lives wife.  Son.     Social Determinants of Health   Financial Resource Strain: Not on file  Food Insecurity: Not on file  Transportation Needs: Not on file  Physical Activity: Not on file  Stress: Not on file  Social Connections: Not on file  Intimate Partner Violence: Not on file    FAMILY HISTORY: Family History  Problem Relation Age of Onset  . Alzheimer's disease Mother   . Alcohol abuse Father   . Cancer Father        leukemia  . Cirrhosis Father   . Stroke Brother   . Diabetes Daughter     ALLERGIES:  has No Known Allergies.  MEDICATIONS:  Current Outpatient Medications  Medication Sig Dispense Refill  . hydroxyurea (HYDREA) 500 MG capsule Take 1  capsule (500 mg total) by mouth 2 (two) times daily. May take with food to minimize GI side effects. 60 capsule 2  . apixaban (ELIQUIS) 5 MG TABS tablet Take 1 tablet (5 mg total) by mouth 2 (two) times daily. 60 tablet 11  . lisinopril-hydrochlorothiazide (ZESTORETIC) 20-12.5 MG tablet Take 2 tablets by mouth daily. 60 tablet 11  . metoprolol tartrate (LOPRESSOR) 25 MG tablet Take 1 tablet (25 mg total) by mouth 2 (two) times daily. 60 tablet 11  . Multiple Vitamin (MULTIVITAMIN) capsule Take 1 capsule by mouth daily.    Marland Kitchen PARoxetine (PAXIL) 10 MG tablet Take 0.5 tablets (5 mg total) by mouth 2 (two) times daily. 90 tablet 3   No current  facility-administered medications for this visit.    REVIEW OF SYSTEMS:   Constitutional: ( - ) fevers, ( - )  chills , ( - ) night sweats Eyes: ( - ) blurriness of vision, ( - ) double vision, ( - ) watery eyes Ears, nose, mouth, throat, and face: ( - ) mucositis, ( - ) sore throat Respiratory: ( - ) cough, ( - ) dyspnea, ( - ) wheezes Cardiovascular: ( - ) palpitation, ( - ) chest discomfort, ( - ) lower extremity swelling Gastrointestinal:  ( - ) nausea, ( - ) heartburn, ( - ) change in bowel habits Skin: ( - ) abnormal skin rashes Lymphatics: ( - ) new lymphadenopathy, ( - ) easy bruising Neurological: ( - ) numbness, ( - ) tingling, ( - ) new weaknesses Behavioral/Psych: ( - ) mood change, ( - ) new changes  All other systems were reviewed with the patient and are negative.  PHYSICAL EXAMINATION:  Vitals:   10/11/20 1111  BP: (!) 142/95  Pulse: 70  Resp: 17  Temp: (!) 97.4 F (36.3 C)  SpO2: 96%   Filed Weights   10/11/20 1111  Weight: 220 lb 3.2 oz (99.9 kg)    GENERAL: well appearing elderly Caucasian male alert, no distress and comfortable SKIN: skin color, texture, turgor are normal, no rashes or significant lesions EYES: conjunctiva are pink and non-injected, sclera clear LUNGS: clear to auscultation and percussion with normal breathing effort HEART: regular rate & rhythm and no murmurs and no lower extremity edema Musculoskeletal: no cyanosis of digits and no clubbing  PSYCH: alert & oriented x 3, fluent speech NEURO: no focal motor/sensory deficits  LABORATORY DATA:  I have reviewed the data as listed CBC Latest Ref Rng & Units 10/06/2020 08/30/2020 08/16/2020  WBC 4.0 - 10.5 K/uL 14.0(H) 12.7(H) 12.4(H)  Hemoglobin 13.0 - 17.0 g/dL 17.6(H) 17.4(H) 16.7  Hematocrit 39.0 - 52.0 % 51.6 51.9 50.9  Platelets 150 - 400 K/uL 605(H) 631(H) 728(H)    CMP Latest Ref Rng & Units 08/30/2020 08/16/2020 10/06/2019  Glucose 70 - 99 mg/dL 105(H) 90 75  BUN 8 - 23 mg/dL 24(H)  29(H) 25  Creatinine 0.61 - 1.24 mg/dL 1.31(H) 1.34(H) 1.09  Sodium 135 - 145 mmol/L 140 139 141  Potassium 3.5 - 5.1 mmol/L 3.8 5.0 4.7  Chloride 98 - 111 mmol/L 102 99 100  CO2 22 - 32 mmol/L _0 Calcium 8.9 - 10.3 mg/dL 9.7 9.9 9.4  Total Protein 6.5 - 8.1 g/dL 7.5 7.0 6.8  Total Bilirubin 0.3 - 1.2 mg/dL 0.7 0.7 0.7  Alkaline Phos 38 - 126 U/L 52 63 62  AST 15 - 41 U/L _1 ALT 0 - 44 U/L 34 21 22  No results found for: MPROTEIN No results found for: KPAFRELGTCHN, Vania Rea, KAPLAMBRATIO  Pathology:    RADIOGRAPHIC STUDIES: CT Biopsy  Result Date: 10/25/20 INDICATION: 80 year old male with concern for myeloproliferative neoplasm. EXAM: CT-GUIDED BONE MARROW BIOPSY AND ASPIRATION MEDICATIONS: None ANESTHESIA/SEDATION: Fentanyl 100 mcg IV; Versed 2 mg IV Sedation Time: 10 minutes; The patient was continuously monitored during the procedure by the interventional radiology nurse under my direct supervision. COMPLICATIONS: None immediate. PROCEDURE: Informed consent was obtained from the patient following an explanation of the procedure, risks, benefits and alternatives. The patient understands, agrees and consents for the procedure. All questions were addressed. A time out was performed prior to the initiation of the procedure. The patient was positioned prone and non-contrast localization CT was performed of the pelvis to demonstrate the iliac marrow spaces. The operative site was prepped and draped in the usual sterile fashion. Under sterile conditions and local anesthesia, a 22 gauge spinal needle was utilized for procedural planning. Next, an 11 gauge coaxial bone biopsy needle was advanced into the right iliac marrow space. Needle position was confirmed with CT imaging. Initially, a bone marrow aspiration was performed. Next, a bone marrow biopsy was obtained with the 11 gauge outer bone marrow device. Samples were prepared with the cytotechnologist and deemed adequate.  The needle was removed and superficial hemostasis was obtained with manual compression. A dressing was applied. The patient tolerated the procedure well without immediate post procedural complication. IMPRESSION: Successful CT guided right iliac bone marrow aspiration and core biopsy. Ruthann Cancer, MD [facility] Rad[facility] Electronically Signed   By: Ruthann Cancer MD   On: 25-Oct-2020 10:20   CT BONE MARROW BIOPSY & ASPIRATION  Result Date: 2020-10-25 INDICATION: 80 year old male with concern for myeloproliferative neoplasm. EXAM: CT-GUIDED BONE MARROW BIOPSY AND ASPIRATION MEDICATIONS: None ANESTHESIA/SEDATION: Fentanyl 100 mcg IV; Versed 2 mg IV Sedation Time: 10 minutes; The patient was continuously monitored during the procedure by the interventional radiology nurse under my direct supervision. COMPLICATIONS: None immediate. PROCEDURE: Informed consent was obtained from the patient following an explanation of the procedure, risks, benefits and alternatives. The patient understands, agrees and consents for the procedure. All questions were addressed. A time out was performed prior to the initiation of the procedure. The patient was positioned prone and non-contrast localization CT was performed of the pelvis to demonstrate the iliac marrow spaces. The operative site was prepped and draped in the usual sterile fashion. Under sterile conditions and local anesthesia, a 22 gauge spinal needle was utilized for procedural planning. Next, an 11 gauge coaxial bone biopsy needle was advanced into the right iliac marrow space. Needle position was confirmed with CT imaging. Initially, a bone marrow aspiration was performed. Next, a bone marrow biopsy was obtained with the 11 gauge outer bone marrow device. Samples were prepared with the cytotechnologist and deemed adequate. The needle was removed and superficial hemostasis was obtained with manual compression. A dressing  was applied. The patient tolerated the procedure well without immediate post procedural complication. IMPRESSION: Successful CT guided right iliac bone marrow aspiration and core biopsy. Ruthann Cancer, MD [facility]iology Electronically Signed   By: Ruthann Cancer MD   On: October 25, 2020 10:20    ASSESSMENT & PLAN Justin Gibbs 80 y.o. male with medical history significant for polycythemia vera who presents for a follow up visit.   After review the labs, the records, discussion with the patient the findings most consistent with a polycythemia  vera which is JAK2 V6 1 7 F positive.  These results were confirmed with a bone marrow biopsy.  Given the patient's age greater than 37 he is considered a high risk polycythemia vera and therefore would require cytoreductive therapy with hydroxyurea 500 mg twice daily in addition to every 2 week phlebotomies.  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  --recommend ASA 67m PO daily in addition to the patient's eliquis.  --will start with q 2 week phlebotomies and continue to HCT goal of <45% --baseline iron labs from 08/30/2020 show ferritin of 193 --RTC in 4 weeks time to assure patient is tolerating therapies.   No orders of the defined types were placed in this encounter.   All questions were answered. The patient knows to call the clinic with any problems, questions or concerns.  A total of more than 30 minutes were spent on this encounter and over half of that time was spent on counseling and coordination of care as outlined above.   JLedell Peoples MD Department of Hematology/Oncology CWahnetaat [facility] CenterPhone: [phone]: [phone]:  [email or web address]  10/11/2020 3:31 PM

## 2020-10-11 ENCOUNTER — Inpatient Hospital Stay: Payer: Medicare Other | Attending: Hematology and Oncology | Admitting: Hematology and Oncology

## 2020-10-11 ENCOUNTER — Other Ambulatory Visit: Payer: Medicare Other

## 2020-10-11 ENCOUNTER — Other Ambulatory Visit: Payer: Self-pay

## 2020-10-11 ENCOUNTER — Encounter: Payer: Self-pay | Admitting: Hematology and Oncology

## 2020-10-11 VITALS — BP 142/95 | HR 70 | Temp 97.4°F | Resp 17 | Ht 69.0 in | Wt 220.2 lb

## 2020-10-11 DIAGNOSIS — Z7901 Long term (current) use of anticoagulants: Secondary | ICD-10-CM | POA: Insufficient documentation

## 2020-10-11 DIAGNOSIS — Z833 Family history of diabetes mellitus: Secondary | ICD-10-CM | POA: Diagnosis not present

## 2020-10-11 DIAGNOSIS — Z87891 Personal history of nicotine dependence: Secondary | ICD-10-CM | POA: Insufficient documentation

## 2020-10-11 DIAGNOSIS — Z8546 Personal history of malignant neoplasm of prostate: Secondary | ICD-10-CM | POA: Diagnosis not present

## 2020-10-11 DIAGNOSIS — D751 Secondary polycythemia: Secondary | ICD-10-CM | POA: Insufficient documentation

## 2020-10-11 DIAGNOSIS — D45 Polycythemia vera: Secondary | ICD-10-CM | POA: Diagnosis not present

## 2020-10-11 DIAGNOSIS — D75839 Thrombocytosis, unspecified: Secondary | ICD-10-CM

## 2020-10-11 DIAGNOSIS — I10 Essential (primary) hypertension: Secondary | ICD-10-CM | POA: Insufficient documentation

## 2020-10-11 DIAGNOSIS — Z806 Family history of leukemia: Secondary | ICD-10-CM | POA: Insufficient documentation

## 2020-10-11 DIAGNOSIS — Z79899 Other long term (current) drug therapy: Secondary | ICD-10-CM | POA: Diagnosis not present

## 2020-10-11 MED ORDER — HYDROXYUREA 500 MG PO CAPS: 500.0000 mg | ORAL_CAPSULE | Freq: Two times a day (BID) | ORAL | 2 refills | Status: DC

## 2020-10-12 ENCOUNTER — Telehealth: Payer: Self-pay | Admitting: Hematology and Oncology

## 2020-10-12 ENCOUNTER — Encounter: Payer: Self-pay | Admitting: Hematology and Oncology

## 2020-10-12 NOTE — Telephone Encounter (Signed)
Scheduled per los. Called and spoke with patient. Confirmed appts  

## 2020-10-13 ENCOUNTER — Telehealth: Payer: Self-pay | Admitting: Hematology and Oncology

## 2020-10-13 NOTE — Telephone Encounter (Signed)
Called and spoke with patient. Confirmed all appts  °

## 2020-10-16 ENCOUNTER — Encounter (HOSPITAL_COMMUNITY): Payer: Self-pay | Admitting: Hematology and Oncology

## 2020-10-16 LAB — SURGICAL PATHOLOGY

## 2020-10-17 ENCOUNTER — Encounter: Payer: Self-pay | Admitting: Hematology and Oncology

## 2020-10-18 ENCOUNTER — Other Ambulatory Visit: Payer: Self-pay

## 2020-10-18 ENCOUNTER — Inpatient Hospital Stay: Payer: Medicare Other

## 2020-10-18 VITALS — BP 136/68 | HR 62 | Temp 97.8°F | Resp 18

## 2020-10-18 DIAGNOSIS — Z806 Family history of leukemia: Secondary | ICD-10-CM | POA: Diagnosis not present

## 2020-10-18 DIAGNOSIS — Z79899 Other long term (current) drug therapy: Secondary | ICD-10-CM | POA: Diagnosis not present

## 2020-10-18 DIAGNOSIS — Z833 Family history of diabetes mellitus: Secondary | ICD-10-CM | POA: Diagnosis not present

## 2020-10-18 DIAGNOSIS — I10 Essential (primary) hypertension: Secondary | ICD-10-CM | POA: Diagnosis not present

## 2020-10-18 DIAGNOSIS — D45 Polycythemia vera: Secondary | ICD-10-CM

## 2020-10-18 DIAGNOSIS — D751 Secondary polycythemia: Secondary | ICD-10-CM | POA: Diagnosis not present

## 2020-10-18 DIAGNOSIS — Z87891 Personal history of nicotine dependence: Secondary | ICD-10-CM | POA: Diagnosis not present

## 2020-10-18 DIAGNOSIS — Z7901 Long term (current) use of anticoagulants: Secondary | ICD-10-CM | POA: Diagnosis not present

## 2020-10-18 NOTE — Progress Notes (Signed)
Justin Gibbs presents today for phlebotomy per Lorenso Courier MD orders. Phlebotomy procedure started at 0816 and ended at [phone]. 511 grams removed via 16 G needle to [place].  IV needle removed intact.  Patient observed for 30 minutes after procedure without any incident and provided food and drink. Patient tolerated procedure well.

## 2020-10-18 NOTE — Patient Instructions (Signed)

## 2020-10-19 ENCOUNTER — Other Ambulatory Visit: Payer: Self-pay | Admitting: Hematology and Oncology

## 2020-10-19 ENCOUNTER — Encounter: Payer: Self-pay | Admitting: Hematology and Oncology

## 2020-10-19 ENCOUNTER — Telehealth: Payer: Self-pay | Admitting: *Deleted

## 2020-10-19 DIAGNOSIS — C61 Malignant neoplasm of prostate: Secondary | ICD-10-CM

## 2020-10-19 NOTE — Telephone Encounter (Signed)
TCT patient regarding his questions about his prostate cancer. No answer and no vm available.  Will try again later in the day

## 2020-10-19 NOTE — Telephone Encounter (Signed)
-----   Message from Orson Slick, MD sent at 10/19/2020  8:20 AM EDT ----- Justin Gibbs,  Could you please call [name] to reassure him that we will get him in with Dr. Tammi Klippel as soon as we are able. Also please let him know that prostate cancer is slow growing process and that if we can get him in with Rad/Onc in about 1-2 weeks that it should be fine. If he has further questions I can call him next week when I am back in the office.  [name]

## 2020-10-31 ENCOUNTER — Telehealth: Payer: Self-pay | Admitting: Hematology and Oncology

## 2020-10-31 DIAGNOSIS — C4402 Squamous cell carcinoma of skin of lip: Secondary | ICD-10-CM | POA: Diagnosis not present

## 2020-10-31 NOTE — Telephone Encounter (Signed)
Added lab appointment per 4/5 schedule message. Patient is aware.

## 2020-11-01 ENCOUNTER — Inpatient Hospital Stay: Payer: Medicare Other

## 2020-11-01 ENCOUNTER — Other Ambulatory Visit: Payer: Self-pay | Admitting: Hematology and Oncology

## 2020-11-01 ENCOUNTER — Other Ambulatory Visit: Payer: Self-pay

## 2020-11-01 ENCOUNTER — Inpatient Hospital Stay: Payer: Medicare Other | Attending: Hematology and Oncology

## 2020-11-01 ENCOUNTER — Encounter: Payer: Self-pay | Admitting: Hematology and Oncology

## 2020-11-01 DIAGNOSIS — Z79899 Other long term (current) drug therapy: Secondary | ICD-10-CM | POA: Insufficient documentation

## 2020-11-01 DIAGNOSIS — Z87891 Personal history of nicotine dependence: Secondary | ICD-10-CM | POA: Diagnosis not present

## 2020-11-01 DIAGNOSIS — Z7901 Long term (current) use of anticoagulants: Secondary | ICD-10-CM | POA: Insufficient documentation

## 2020-11-01 DIAGNOSIS — D45 Polycythemia vera: Secondary | ICD-10-CM | POA: Insufficient documentation

## 2020-11-01 DIAGNOSIS — I10 Essential (primary) hypertension: Secondary | ICD-10-CM | POA: Diagnosis not present

## 2020-11-01 DIAGNOSIS — Z8546 Personal history of malignant neoplasm of prostate: Secondary | ICD-10-CM | POA: Insufficient documentation

## 2020-11-01 LAB — CBC WITH DIFFERENTIAL (CANCER CENTER ONLY)
Abs Immature Granulocytes: 0.05 10*3/uL (ref 0.00–0.07)
Basophils Absolute: 0.1 10*3/uL (ref 0.0–0.1)
Basophils Relative: 1 %
Eosinophils Absolute: 0.2 10*3/uL (ref 0.0–0.5)
Eosinophils Relative: 2 %
HCT: 48.1 % (ref 39.0–52.0)
Hemoglobin: 16.2 g/dL (ref 13.0–17.0)
Immature Granulocytes: 1 %
Lymphocytes Relative: 19 %
Lymphs Abs: 1.6 10*3/uL (ref 0.7–4.0)
MCH: 30.3 pg (ref 26.0–34.0)
MCHC: 33.7 g/dL (ref 30.0–36.0)
MCV: 90.1 fL (ref 80.0–100.0)
Monocytes Absolute: 0.6 10*3/uL (ref 0.1–1.0)
Monocytes Relative: 7 %
Neutro Abs: 5.8 10*3/uL (ref 1.7–7.7)
Neutrophils Relative %: 70 %
Platelet Count: 447 10*3/uL — ABNORMAL HIGH (ref 150–400)
RBC: 5.34 MIL/uL (ref 4.22–5.81)
RDW: 19.1 % — ABNORMAL HIGH (ref 11.5–15.5)
WBC Count: 8.3 10*3/uL (ref 4.0–10.5)
nRBC: 0 % (ref 0.0–0.2)

## 2020-11-01 LAB — CMP (CANCER CENTER ONLY)
ALT: 25 U/L (ref 0–44)
AST: 22 U/L (ref 15–41)
Albumin: 4.1 g/dL (ref 3.5–5.0)
Alkaline Phosphatase: 53 U/L (ref 38–126)
Anion gap: 11 (ref 5–15)
BUN: 29 mg/dL — ABNORMAL HIGH (ref 8–23)
CO2: 28 mmol/L (ref 22–32)
Calcium: 9.1 mg/dL (ref 8.9–10.3)
Chloride: 100 mmol/L (ref 98–111)
Creatinine: 1.12 mg/dL (ref 0.61–1.24)
GFR, Estimated: >60 mL/min (ref >60)
Glucose, Bld: 90 mg/dL (ref 70–99)
Potassium: 4.2 mmol/L (ref 3.5–5.1)
Sodium: 139 mmol/L (ref 135–145)
Total Bilirubin: 1 mg/dL (ref 0.3–1.2)
Total Protein: 7.4 g/dL (ref 6.5–8.1)

## 2020-11-01 LAB — FERRITIN: Ferritin: 182 ng/mL (ref 24–336)

## 2020-11-01 LAB — IRON AND TIBC
Iron: 128 ug/dL (ref 42–163)
Saturation Ratios: 36 % (ref 20–55)
TIBC: 359 ug/dL (ref 202–409)
UIBC: 230 ug/dL (ref 117–376)

## 2020-11-01 NOTE — Progress Notes (Signed)
Phlebotomy orders received to keep HCT<45. 18g placed in pt L AC. Phlebotmy started at 0934, 515g removed. Stopped at [phone]. VSS post phlebotomy. Pt tolerated well. Provided with snacks and drinks afterwards. Pt feels well and stable after 30 minute observation period

## 2020-11-01 NOTE — Patient Instructions (Signed)

## 2020-11-14 ENCOUNTER — Other Ambulatory Visit: Payer: Self-pay | Admitting: Hematology and Oncology

## 2020-11-14 DIAGNOSIS — C61 Malignant neoplasm of prostate: Secondary | ICD-10-CM

## 2020-11-15 ENCOUNTER — Encounter: Payer: Self-pay | Admitting: Hematology and Oncology

## 2020-11-15 ENCOUNTER — Inpatient Hospital Stay: Payer: Medicare Other

## 2020-11-15 ENCOUNTER — Inpatient Hospital Stay: Payer: Medicare Other | Admitting: Hematology and Oncology

## 2020-11-15 ENCOUNTER — Other Ambulatory Visit: Payer: Self-pay

## 2020-11-15 ENCOUNTER — Other Ambulatory Visit: Payer: Self-pay | Admitting: Hematology and Oncology

## 2020-11-15 VITALS — BP 160/94 | HR 63 | Temp 97.9°F | Resp 19 | Ht 69.0 in | Wt 223.0 lb

## 2020-11-15 DIAGNOSIS — D75839 Thrombocytosis, unspecified: Secondary | ICD-10-CM | POA: Diagnosis not present

## 2020-11-15 DIAGNOSIS — Z7901 Long term (current) use of anticoagulants: Secondary | ICD-10-CM | POA: Diagnosis not present

## 2020-11-15 DIAGNOSIS — D45 Polycythemia vera: Secondary | ICD-10-CM

## 2020-11-15 DIAGNOSIS — I10 Essential (primary) hypertension: Secondary | ICD-10-CM | POA: Diagnosis not present

## 2020-11-15 DIAGNOSIS — Z87891 Personal history of nicotine dependence: Secondary | ICD-10-CM | POA: Diagnosis not present

## 2020-11-15 DIAGNOSIS — Z79899 Other long term (current) drug therapy: Secondary | ICD-10-CM | POA: Diagnosis not present

## 2020-11-15 LAB — CMP (CANCER CENTER ONLY)
ALT: 19 U/L (ref 0–44)
AST: 21 U/L (ref 15–41)
Albumin: 4 g/dL (ref 3.5–5.0)
Alkaline Phosphatase: 46 U/L (ref 38–126)
Anion gap: 9 (ref 5–15)
BUN: 30 mg/dL — ABNORMAL HIGH (ref 8–23)
CO2: 31 mmol/L (ref 22–32)
Calcium: 9.3 mg/dL (ref 8.9–10.3)
Chloride: 103 mmol/L (ref 98–111)
Creatinine: 1.35 mg/dL — ABNORMAL HIGH (ref 0.61–1.24)
GFR, Estimated: 54 mL/min — ABNORMAL LOW (ref >60)
Glucose, Bld: 97 mg/dL (ref 70–99)
Potassium: 4.4 mmol/L (ref 3.5–5.1)
Sodium: 143 mmol/L (ref 135–145)
Total Bilirubin: 0.7 mg/dL (ref 0.3–1.2)
Total Protein: 7.2 g/dL (ref 6.5–8.1)

## 2020-11-15 LAB — CBC WITH DIFFERENTIAL (CANCER CENTER ONLY)
Abs Immature Granulocytes: 0.03 10*3/uL (ref 0.00–0.07)
Basophils Absolute: 0.1 10*3/uL (ref 0.0–0.1)
Basophils Relative: 1 %
Eosinophils Absolute: 0.3 10*3/uL (ref 0.0–0.5)
Eosinophils Relative: 3 %
HCT: 44.5 % (ref 39.0–52.0)
Hemoglobin: 14.9 g/dL (ref 13.0–17.0)
Immature Granulocytes: 0 %
Lymphocytes Relative: 19 %
Lymphs Abs: 1.8 10*3/uL (ref 0.7–4.0)
MCH: 31.5 pg (ref 26.0–34.0)
MCHC: 33.5 g/dL (ref 30.0–36.0)
MCV: 94.1 fL (ref 80.0–100.0)
Monocytes Absolute: 0.5 10*3/uL (ref 0.1–1.0)
Monocytes Relative: 6 %
Neutro Abs: 6.4 10*3/uL (ref 1.7–7.7)
Neutrophils Relative %: 71 %
Platelet Count: 384 10*3/uL (ref 150–400)
RBC: 4.73 MIL/uL (ref 4.22–5.81)
RDW: 21.2 % — ABNORMAL HIGH (ref 11.5–15.5)
WBC Count: 9 10*3/uL (ref 4.0–10.5)
nRBC: 0 % (ref 0.0–0.2)

## 2020-11-15 NOTE — Progress Notes (Signed)
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 Lorenso Courier  10/11/2020: started phebotomy q 2 weeks and hydroxyurea 554m BID 11/15/2020: WBC 9.0, Hgb 14.9, MCT 44.5%, Plt 384  #Hx of Prostate Cancer 2006: robotic resection of prostate.  11/16/2019: PSA 0.21([facility])  08/08/2020: PSA 0.36 ([facility]   Interval History:  RHildred Laser80y.o. male with medical history significant for polycythemia vera who presents for a follow up visit. The patient's last visit was on 10/11/2020. In the interim since the last visit the patient has reached his goal hematocrit of less than 45% with phlebotomy and hydroxyurea 5 mg twice daily.  On exam today Mr. Rohlman reports he has been quite well in the interim since her last visit.  He reports that his knees feel better and his energy levels are markedly improved.  He is tolerating the hydroxyurea therapy well without any stomach upset or diarrhea.  Additionally he denies having any sores on his ankles.  Overall he is quite pleased with the results of his labs today.  He notes he has not had any weakness, numbness, chest pain, shortness of breath, or lower extremity swelling.  A full 10 point ROS is listed below.   MEDICAL HISTORY:  Past Medical History:  Diagnosis Date  . Cancer ([facility]    prostate / removed 2006  . Depression   . High triglycerides   . Hypertension     SURGICAL HISTORY: Past Surgical History:  Procedure Laterality Date  . HERNIA REPAIR Bilateral    x 2   . PROSTATECTOMY       SOCIAL HISTORY: Social History   Socioeconomic History  . Marital status: Married    Spouse name: Dawn  . Number of children: 3  . Years of education: Not on file  . Highest education level: Not on file  Occupational History  . Occupation: retired     Comment: 2007  Tobacco Use  . Smoking status: Former SResearch scientist (life sciences) . Smokeless tobacco: Never Used  Vaping Use  . Vaping Use: Never used  Substance and Sexual Activity  . Alcohol use: Never  . Drug use: Never  . Sexual activity: Not on file  Other Topics Concern  . Not on file  Social History Narrative   Lives wife.  Son.     Social Determinants of Health   Financial Resource Strain: Not on file  Food Insecurity: Not on file  Transportation Needs: Not on file  Physical Activity: Not on file  Stress: Not on file  Social Connections: Not on file  Intimate Partner Violence: Not on file    FAMILY HISTORY: Family History  Problem Relation Age of Onset  . Alzheimer's disease Mother   . Alcohol abuse Father   . Cancer Father        leukemia  . Cirrhosis Father   . Stroke Brother   . Diabetes Daughter     ALLERGIES:  has No Known Allergies.  MEDICATIONS:  Current Outpatient Medications  Medication Sig Dispense Refill  . apixaban (ELIQUIS) 5 MG TABS tablet Take 1 tablet (5 mg total) by  mouth 2 (two) times daily. 60 tablet 11  . FLUAD QUADRIVALENT 0.5 ML injection     . hydroxyurea (HYDREA) 500 MG capsule Take 1 capsule (500 mg total) by mouth 2 (two) times daily. May take with food to minimize GI side effects. 60 capsule 2  . lisinopril-hydrochlorothiazide (ZESTORETIC) 20-12.5 MG tablet Take 2 tablets by mouth daily. 60 tablet 11  . metoprolol tartrate (LOPRESSOR) 25 MG tablet Take 1 tablet (25 mg total) by mouth 2 (two) times daily. 60 tablet 11  . Multiple Vitamin (MULTIVITAMIN) capsule Take 1 capsule by mouth daily.    Marland Kitchen PARoxetine (PAXIL) 10 MG tablet Take 0.5 tablets (5 mg total) by mouth 2 (two) times daily. 90  tablet 3   No current facility-administered medications for this visit.    REVIEW OF SYSTEMS:   Constitutional: ( - ) fevers, ( - )  chills , ( - ) night sweats Eyes: ( - ) blurriness of vision, ( - ) double vision, ( - ) watery eyes Ears, nose, mouth, throat, and face: ( - ) mucositis, ( - ) sore throat Respiratory: ( - ) cough, ( - ) dyspnea, ( - ) wheezes Cardiovascular: ( - ) palpitation, ( - ) chest discomfort, ( - ) lower extremity swelling Gastrointestinal:  ( - ) nausea, ( - ) heartburn, ( - ) change in bowel habits Skin: ( - ) abnormal skin rashes Lymphatics: ( - ) new lymphadenopathy, ( - ) easy bruising Neurological: ( - ) numbness, ( - ) tingling, ( - ) new weaknesses Behavioral/Psych: ( - ) mood change, ( - ) new changes  All other systems were reviewed with the patient and are negative.  PHYSICAL EXAMINATION:  Vitals:   11/15/20 1013  BP: (!) 160/94  Pulse: 63  Resp: 19  Temp: 97.9 F (36.6 C)  SpO2: 97%   Filed Weights   11/15/20 1013  Weight: 223 lb (101.2 kg)    GENERAL: well appearing elderly Caucasian male alert, no distress and comfortable SKIN: skin color, texture, turgor are normal, no rashes or significant lesions EYES: conjunctiva are pink and non-injected, sclera clear LUNGS: clear to auscultation and percussion with normal breathing effort HEART: regular rate & rhythm and no murmurs and no lower extremity edema Musculoskeletal: no cyanosis of digits and no clubbing  PSYCH: alert & oriented x 3, fluent speech NEURO: no focal motor/sensory deficits  LABORATORY DATA:  I have reviewed the data as listed CBC Latest Ref Rng & Units 11/15/2020 11/01/2020 10/06/2020  WBC 4.0 - 10.5 K/uL 9.0 8.3 14.0(H)  Hemoglobin 13.0 - 17.0 g/dL 14.9 16.2 17.6(H)  Hematocrit 39.0 - 52.0 % 44.5 48.1 51.6  Platelets 150 - 400 K/uL 384 447(H) 605(H)    CMP Latest Ref Rng & Units 11/15/2020 11/01/2020 08/30/2020  Glucose 70 - 99 mg/dL 97 90 105(H)  BUN 8 - 23 mg/dL 30(H)  29(H) 24(H)  Creatinine 0.61 - 1.24 mg/dL 1.35(H) 1.12 1.31(H)  Sodium 135 - 145 mmol/L 143 139 140  Potassium 3.5 - 5.1 mmol/L 4.4 4.2 3.8  Chloride 98 - 111 mmol/L 103 100 102  CO2 22 - 32 mmol/L 31 28 30   Calcium 8.9 - 10.3 mg/dL 9.3 9.1 9.7  Total Protein 6.5 - 8.1 g/dL 7.2 7.4 7.5  Total Bilirubin 0.3 - 1.2 mg/dL 0.7 1.0 0.7  Alkaline Phos 38 - 126 U/L 46 53 52  AST 15 - 41 U/L 21 22 25   ALT 0 - 44 U/L 19 25  34    No results found for: MPROTEIN No results found for: KPAFRELGTCHN, LAMBDASER, KAPLAMBRATIO  Pathology:    RADIOGRAPHIC STUDIES: No results found.  ASSESSMENT & PLAN DOMNIC VANTOL 80 y.o. male with medical history significant for polycythemia vera who presents for a follow up visit.   After review of the labs, the records, and discussion with the patient the findings most consistent with a polycythemia vera which is JAK2 V617 F positive.  These results were confirmed with a bone marrow biopsy.  Given the patient's age greater than 28 he is considered a high risk polycythemia vera and therefore would require cytoreductive therapy with hydroxyurea 500 mg twice daily in addition to every 2 week phlebotomies.  Goal hematocrit for this patient is less than 45%.  Additionally he is on Eliquis therapy which will serve as anticoagulation for thromboprophylaxis.  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  --holding ASA 48m PO daily at the patient is on eliquis.  --will start with q 2 week phlebotomies and continue to HCT goal of <45% --baseline iron labs from 08/30/2020 show ferritin of 193 --RTC in 4 weeks time to assure patient is tolerating therapies.   No orders of the defined types were placed in this encounter.   All questions were answered. The patient knows  to call the clinic with any problems, questions or concerns.  A total of more than 30 minutes were spent on this encounter and over half of that time was spent on counseling and coordination of care as outlined above.   JLedell Peoples MD Department of Hematology/Oncology CIukaat [facility] CenterPhone: [phone]: [phone]: [email or web address]  11/15/2020 12:31 PM

## 2020-11-15 NOTE — Progress Notes (Signed)
Proceed with Phlebotomy with Hct of 44.5 today. Obtained 572 gms via left antecubital. Tolerated well, eating and drinking after treatment. Reviewed discharge paperwork and encouraged eating and drinking well the rest of day. Patient verbalized understanding

## 2020-11-15 NOTE — Patient Instructions (Signed)

## 2020-11-28 ENCOUNTER — Ambulatory Visit
Admission: RE | Admit: 2020-11-28 | Discharge: 2020-11-28 | Disposition: A | Discharge status: Home / Self Care | Payer: Medicare Other | Source: Ambulatory Visit | Attending: Hematology and Oncology | Admitting: Hematology and Oncology

## 2020-11-28 ENCOUNTER — Other Ambulatory Visit: Payer: Self-pay

## 2020-11-28 ENCOUNTER — Ambulatory Visit
Admission: RE | Admit: 2020-11-28 | Discharge: 2020-11-28 | Disposition: A | Discharge status: Home / Self Care | Payer: Medicare Other | Source: Ambulatory Visit | Attending: Radiation Oncology | Admitting: Radiation Oncology

## 2020-11-28 ENCOUNTER — Encounter: Payer: Self-pay | Admitting: Radiation Oncology

## 2020-11-28 VITALS — BP 164/84 | HR 66 | Temp 96.7°F | Resp 20 | Ht 69.0 in | Wt 224.1 lb

## 2020-11-28 DIAGNOSIS — D75839 Thrombocytosis, unspecified: Secondary | ICD-10-CM | POA: Diagnosis not present

## 2020-11-28 DIAGNOSIS — Z808 Family history of malignant neoplasm of other organs or systems: Secondary | ICD-10-CM | POA: Diagnosis not present

## 2020-11-28 DIAGNOSIS — E785 Hyperlipidemia, unspecified: Secondary | ICD-10-CM | POA: Diagnosis not present

## 2020-11-28 DIAGNOSIS — C61 Malignant neoplasm of prostate: Secondary | ICD-10-CM | POA: Diagnosis present

## 2020-11-28 DIAGNOSIS — Z87891 Personal history of nicotine dependence: Secondary | ICD-10-CM | POA: Diagnosis not present

## 2020-11-28 DIAGNOSIS — Z7901 Long term (current) use of anticoagulants: Secondary | ICD-10-CM | POA: Diagnosis not present

## 2020-11-28 DIAGNOSIS — I10 Essential (primary) hypertension: Secondary | ICD-10-CM | POA: Insufficient documentation

## 2020-11-28 DIAGNOSIS — Z79899 Other long term (current) drug therapy: Secondary | ICD-10-CM | POA: Diagnosis not present

## 2020-11-28 DIAGNOSIS — Z191 Hormone sensitive malignancy status: Secondary | ICD-10-CM | POA: Diagnosis not present

## 2020-11-28 DIAGNOSIS — D45 Polycythemia vera: Secondary | ICD-10-CM | POA: Diagnosis not present

## 2020-11-28 NOTE — Progress Notes (Signed)
GU Location of Tumor / Histology: biochemical recurrent prostate cancer  If Prostate Cancer, Gleason Score is (3 + 4) in 2006  Justin Gibbs had a prostatectomy in November 2006. Patient has been under active surveillance with Dr. Rosana Hoes since then. Unfortunately, his PSA began to rise in 2016 and recently double.   Past/Anticipated interventions by urology, if any: prostate biopsy, prostatectomy, active surveillance, referral to Dr. Tammi Klippel for salvage radiation.  Past/Anticipated interventions by medical oncology, if any: Patient sees Dr. Lorenso Courier for the management of his blood cancer. Patient has an appointment with Dr. Lorenso Courier tomorrow.   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  Weight changes, if any: no  Bowel/Bladder complaints, if any: IPSS 9. SHIM 5. Denies dysuria or hematuria. Reports occasional urinary leakage associated with urgency. Denies any bowel complaints.    Nausea/Vomiting, if any: no  Pain issues, if any:  no  SAFETY ISSUES:  Prior radiation? no  Pacemaker/ICD? no  Possible current pregnancy? no  Is the patient on methotrexate? no  Current Complaints / other details:  80 year old male. Retired from [employer]. Married with 3 children. Resides in [place]. Former smoker.

## 2020-11-28 NOTE — Progress Notes (Signed)
Radiation Oncology         [phone] ________________________________  Initial Outpatient Consultation  Name: Justin Gibbs MRN: [id number]  Date: 11/28/2020  DOB: August 05, 1940  GH:WEXHBZ, Mary-Margaret, FNP  Myrlene Broker, MD   REFERRING PHYSICIAN: Myrlene Broker, MD  DIAGNOSIS: 80 y.o. 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    ICD-10-CM   1. Malignant neoplasm of prostate ([place])  C61   2. Prostate cancer ([place])  C61   3. 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 male with a diagnosis of biochemically recurrent prostate cancer. He was initially diagnosed with prostate cancer in 2006. He opted to proceed with RALP on 06/14/05 under the care of Dr. Tresa Endo and Dr. Raynelle Bring. Final surgical pathology revealed Gleason 3+4 prostatic adenocarcinoma, with focal extraprostatic extension but negative margins and negative lymph nodes. This was staged as pT3a, pN0. His postoperative PSA was undetectable and only became barely detectable at 0.02 in 2013. Since that time, it has continued to slowly rise. It fluctuated but never stayed over 0.1 until 01/2019, and then reached 0.2 in 10/2019. More recently, his PSA began to rise more rapidly, going from 0.22 in 04/2020 to 0.36 in 07/2020 and now 0.5 in 09/2020.  The patient reviewed the PSA results with his urologist and he has kindly been referred today for discussion of potential radiation treatment options.  Of note, he is followed by Dr. 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   PREVIOUS RADIATION THERAPY: No  PAST MEDICAL HISTORY:  Past Medical History:  Diagnosis Date  . Cancer [facility])    prostate / removed 2006  . Depression   . High triglycerides   . Hypertension       PAST  SURGICAL HISTORY: Past Surgical History:  Procedure Laterality Date  . HERNIA REPAIR Bilateral    x 2   . PROSTATECTOMY      FAMILY HISTORY:  Family History  Problem Relation Age of Onset  . Alzheimer's disease Mother   . Alcohol abuse Father   . Cancer Father        leukemia  . Cirrhosis Father   . Stomach cancer Father   . Stroke Brother   . Diabetes Daughter   . Cancer Maternal Grandmother   . Cancer Paternal Grandfather   . Prostate cancer Brother   . Brain cancer Brother   . Thyroid cancer Sister     SOCIAL HISTORY:  Social History   Socioeconomic History  . Marital status: Married    Spouse name: Dawn  . Number of children: 3  . Years of education: Not on file  . Highest education level: Not on file  Occupational History  . Occupation: retired     Comment: 2007  Tobacco Use  . Smoking status: Former Smoker    Packs/day: 1.00    Years: 23.00    Pack years: 23.00    Types: Cigarettes, Cigars    Quit date: 07/29/1981    Years since quitting: 39.3  . Smokeless tobacco: Never Used  Vaping Use  . Vaping Use: Never used  Substance and Sexual Activity  . Alcohol use: Never  . Drug use: Never  . Sexual activity: Not Currently  Other Topics Concern  . Not  on file  Social History Narrative   Lives wife.  Son.     Social Determinants of Health   Financial Resource Strain: Not on file  Food Insecurity: Not on file  Transportation Needs: Not on file  Physical Activity: Not on file  Stress: Not on file  Social Connections: Not on file  Intimate Partner Violence: Not on file    ALLERGIES: Patient has no known allergies.  MEDICATIONS:  Current Outpatient Medications  Medication Sig Dispense Refill  . apixaban (ELIQUIS) 5 MG TABS tablet Take 1 tablet (5 mg total) by mouth 2 (two) times daily. 60 tablet 11  . hydroxyurea (HYDREA) 500 MG capsule Take 1 capsule (500 mg total) by mouth 2 (two) times daily. May take with food to minimize GI side effects. 60  capsule 2  . lisinopril-hydrochlorothiazide (ZESTORETIC) 20-12.5 MG tablet Take 2 tablets by mouth daily. 60 tablet 11  . metoprolol tartrate (LOPRESSOR) 25 MG tablet Take 1 tablet (25 mg total) by mouth 2 (two) times daily. 60 tablet 11  . Multiple Vitamin (MULTIVITAMIN) capsule Take 1 capsule by mouth daily.    Marland Kitchen PARoxetine (PAXIL) 10 MG tablet Take 0.5 tablets (5 mg total) by mouth 2 (two) times daily. 90 tablet 3   No current facility-administered medications for this encounter.    REVIEW OF SYSTEMS:  On review of systems, the patient reports that he is doing well overall. He denies any chest pain, shortness of breath, cough, fevers, chills, night sweats, unintended weight changes. He denies any bowel disturbances, and denies abdominal pain, nausea or vomiting. He denies any new musculoskeletal or joint aches or pains. His IPSS was 9, indicating mild urinary symptoms. He reports occasional urinary leakage associated with urgency. 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 A complete review of systems is obtained and is otherwise negative.    PHYSICAL EXAM:  Wt Readings from Last 3 Encounters:  11/28/20 224 lb 2 oz (101.7 kg)  11/15/20 223 lb (101.2 kg)  11/01/20 219 lb 6.4 oz (99.5 kg)   Temp Readings from Last 3 Encounters:  11/28/20 (!) 96.7 F (35.9 C) (Temporal)  11/15/20 97.9 F (36.6 C) (Tympanic)  11/01/20 98.4 F (36.9 C) (Oral)   BP Readings from Last 3 Encounters:  11/28/20 (!) 164/84  11/15/20 131/86  11/15/20 (!) 160/94   Pulse Readings from Last 3 Encounters:  11/28/20 66  11/15/20 65  11/15/20 63   Pain Assessment Pain Score: 0-No pain/10  In general this is a well appearing Caucasian male in no acute distress. He's alert and oriented x4 and appropriate throughout the examination. Cardiopulmonary assessment is negative for acute distress, and he exhibits normal effort.     KPS = 90  100 - Normal; no complaints; no  evidence of disease. 90   - Able to carry on normal activity; minor signs or symptoms of disease. 80   - Normal activity with effort; some signs or symptoms of disease. 20   - Cares for self; unable to carry on normal activity or to do active work. 60   - Requires occasional assistance, but is able to care for most of his personal needs. 50   - Requires considerable assistance and frequent medical care. 32   - Disabled; requires special care and assistance. 74   - Severely disabled; hospital admission is indicated although death not imminent. 35   - Very sick; hospital admission necessary; active supportive treatment necessary. 10   - Moribund;  fatal processes progressing rapidly. 0     - Dead  Karnofsky DA, Abelmann [place], Craver LS and Burchenal [facility] [phone]) The use of the nitrogen mustards in the palliative treatment of carcinoma: with particular reference to bronchogenic carcinoma Cancer 1 634-56  LABORATORY DATA:  Lab Results  Component Value Date   WBC 9.0 11/15/2020   HGB 14.9 11/15/2020   HCT 44.5 11/15/2020   MCV 94.1 11/15/2020   PLT 384 11/15/2020   Lab Results  Component Value Date   NA 143 11/15/2020   K 4.4 11/15/2020   CL 103 11/15/2020   CO2 31 11/15/2020   Lab Results  Component Value Date   ALT 19 11/15/2020   AST 21 11/15/2020   ALKPHOS 46 11/15/2020   BILITOT 0.7 11/15/2020     RADIOGRAPHY: No results found.    IMPRESSION/PLAN: 1. 80 y.o. 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 Today, we reviewed the findings and workup thus far.  We discussed the natural history of prostate cancer.  We reviewed the the implications of positive margins, extracapsular extension, and seminal vesicle involvement on the risk of prostate cancer recurrence. We reviewed some of the evidence suggesting an advantage for patients who undergo adjuvant radiotherapy in this setting in terms of disease  control and overall survival but that there is increasing evidence that careful surveillance with ultrasensitive PSA may provide an opportunity for early salvage in patients who undergo observation, which can lead to excellent results in terms of disease control and survival. He has been followed very carefully and is at the point where he would potentially benefit from salvage radiotherapy. We discussed the recommended 7.5 week course of daily radiation directed to the prostatic fossa with regard to the logistics and delivery of external beam radiation treatment. He was encouraged to ask questions that were answered to his stated satisfaction.  At the conclusion of our conversation, the patient is interested in moving forward with 7.5 weeks of salvage external beam therapy. Given the prolonged disease free interval, we do not recommend combining this with ADT since the average time to failure on the salvage XRT + ADT trials was 1.5 yrs (RTOG 9601) and 2.5 yrs (GETUG 16) despite a negative impact on quality of life. Therefore, we feel the risks outweigh the benefits and would recommend proceeding with salvage radiotherapy to the prostate fossa alone.  The patient appears to have a good understanding of his disease and our recommendations which are of curative intent. He has freely signed written consent to proceed today in the office and a copy of this document has been placed in his medical record. He is tentatively scheduled for CT SIM at 9:30am on 11/29/2020 in anticipation of beginning his daily treatments in the near future. We enjoyed meeting him and his wife today and look forward to continuing to participate in his care.  I spent 60 minutes on this encounter - MM    Nicholos Johns, PA-C    Tyler Pita, MD  [place]: [phone]  Fax: [phone] [email or web address]  Skype  [email or web address]   This document serves as a record of services personally performed by  Tyler Pita, MD and Freeman Caldron, PA-C. It was created on their behalf by Wilburn Mylar, a trained medical scribe. The creation of this record is based on the scribe's personal observations and the provider's statements to them. This  document has been checked and approved by the attending provider.

## 2020-11-29 ENCOUNTER — Inpatient Hospital Stay: Payer: Medicare Other | Attending: Hematology and Oncology

## 2020-11-29 ENCOUNTER — Inpatient Hospital Stay: Payer: Medicare Other

## 2020-11-29 ENCOUNTER — Other Ambulatory Visit: Payer: Self-pay | Admitting: Hematology and Oncology

## 2020-11-29 ENCOUNTER — Ambulatory Visit
Admission: RE | Admit: 2020-11-29 | Discharge: 2020-11-29 | Disposition: A | Discharge status: Home / Self Care | Payer: Medicare Other | Source: Ambulatory Visit | Attending: Radiation Oncology | Admitting: Radiation Oncology

## 2020-11-29 DIAGNOSIS — D45 Polycythemia vera: Secondary | ICD-10-CM

## 2020-11-29 DIAGNOSIS — Z79899 Other long term (current) drug therapy: Secondary | ICD-10-CM | POA: Diagnosis not present

## 2020-11-29 DIAGNOSIS — C61 Malignant neoplasm of prostate: Secondary | ICD-10-CM | POA: Insufficient documentation

## 2020-11-29 DIAGNOSIS — Z510 Encounter for antineoplastic radiation therapy: Secondary | ICD-10-CM | POA: Diagnosis not present

## 2020-11-29 DIAGNOSIS — C4402 Squamous cell carcinoma of skin of lip: Secondary | ICD-10-CM | POA: Diagnosis not present

## 2020-11-29 LAB — CMP (CANCER CENTER ONLY)
ALT: 14 U/L (ref 0–44)
AST: 18 U/L (ref 15–41)
Albumin: 4.1 g/dL (ref 3.5–5.0)
Alkaline Phosphatase: 49 U/L (ref 38–126)
Anion gap: 9 (ref 5–15)
BUN: 31 mg/dL — ABNORMAL HIGH (ref 8–23)
CO2: 29 mmol/L (ref 22–32)
Calcium: 9.3 mg/dL (ref 8.9–10.3)
Chloride: 103 mmol/L (ref 98–111)
Creatinine: 1.45 mg/dL — ABNORMAL HIGH (ref 0.61–1.24)
GFR, Estimated: 50 mL/min — ABNORMAL LOW (ref >60)
Glucose, Bld: 97 mg/dL (ref 70–99)
Potassium: 4.2 mmol/L (ref 3.5–5.1)
Sodium: 141 mmol/L (ref 135–145)
Total Bilirubin: 0.8 mg/dL (ref 0.3–1.2)
Total Protein: 7.2 g/dL (ref 6.5–8.1)

## 2020-11-29 LAB — CBC WITH DIFFERENTIAL (CANCER CENTER ONLY)
Abs Immature Granulocytes: 0.03 10*3/uL (ref 0.00–0.07)
Basophils Absolute: 0.1 10*3/uL (ref 0.0–0.1)
Basophils Relative: 1 %
Eosinophils Absolute: 0.2 10*3/uL (ref 0.0–0.5)
Eosinophils Relative: 2 %
HCT: 44.4 % (ref 39.0–52.0)
Hemoglobin: 14.9 g/dL (ref 13.0–17.0)
Immature Granulocytes: 0 %
Lymphocytes Relative: 17 %
Lymphs Abs: 1.4 10*3/uL (ref 0.7–4.0)
MCH: 32.7 pg (ref 26.0–34.0)
MCHC: 33.6 g/dL (ref 30.0–36.0)
MCV: 97.4 fL (ref 80.0–100.0)
Monocytes Absolute: 0.5 10*3/uL (ref 0.1–1.0)
Monocytes Relative: 6 %
Neutro Abs: 6.1 10*3/uL (ref 1.7–7.7)
Neutrophils Relative %: 74 %
Platelet Count: 340 10*3/uL (ref 150–400)
RBC: 4.56 MIL/uL (ref 4.22–5.81)
RDW: 22 % — ABNORMAL HIGH (ref 11.5–15.5)
WBC Count: 8.3 10*3/uL (ref 4.0–10.5)
nRBC: 0 % (ref 0.0–0.2)

## 2020-11-29 LAB — FERRITIN: Ferritin: 48 ng/mL (ref 24–336)

## 2020-11-29 NOTE — Progress Notes (Signed)
Per Dr. Lorenso Courier, okay to hold phlebotomy today due to HCT 44.4.  No need for VSS.  Patient informed.  Patient agreed with decision.

## 2020-11-30 NOTE — Progress Notes (Signed)
  Radiation Oncology         [phone] ________________________________  Name: Justin Gibbs MRN: [id number]  Date: 11/29/2020  DOB: [birth year]  SIMULATION AND TREATMENT PLANNING NOTE    ICD-10-CM   1. Prostate cancer ([place])  C61   2. 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  NARRATIVE:  The patient was brought to the [facility].  Identity was confirmed.  All relevant records and images related to the planned course of therapy were reviewed.  The patient freely provided informed written consent to proceed with treatment after reviewing the details related to the planned course of therapy. The consent form was witnessed and verified by the simulation staff.  Then, the patient was set-up in a stable reproducible supine position for radiation therapy.  A vacuum lock pillow device was custom fabricated to position his legs in a reproducible immobilized position.  Then, I performed a urethrogram under sterile conditions to identify the prostatic apex.  CT images were obtained.  Surface markings were placed.  The CT images were loaded into the planning software.  Then the prostate target and avoidance structures including the rectum, bladder, bowel and hips were contoured.  Treatment planning then occurred.  The radiation prescription was entered and confirmed.  A total of one complex treatment devices was fabricated. I have requested : Intensity Modulated Radiotherapy (IMRT) is medically necessary for this case for the following reason:  Rectal sparing.Marland Kitchen  PLAN:  The patient will receive 68.4 Gy in 38 fractions.  ________________________________  Sheral Apley Tammi Klippel, M.D.

## 2020-12-04 DIAGNOSIS — Z510 Encounter for antineoplastic radiation therapy: Secondary | ICD-10-CM | POA: Diagnosis not present

## 2020-12-07 ENCOUNTER — Other Ambulatory Visit: Payer: Self-pay

## 2020-12-07 ENCOUNTER — Ambulatory Visit
Admission: RE | Admit: 2020-12-07 | Discharge: 2020-12-07 | Disposition: A | Discharge status: Home / Self Care | Payer: Medicare Other | Source: Ambulatory Visit | Attending: Radiation Oncology | Admitting: Radiation Oncology

## 2020-12-07 DIAGNOSIS — Z510 Encounter for antineoplastic radiation therapy: Secondary | ICD-10-CM | POA: Diagnosis not present

## 2020-12-08 ENCOUNTER — Ambulatory Visit
Admission: RE | Admit: 2020-12-08 | Discharge: 2020-12-08 | Disposition: A | Discharge status: Home / Self Care | Payer: Medicare Other | Source: Ambulatory Visit | Attending: Radiation Oncology | Admitting: Radiation Oncology

## 2020-12-08 DIAGNOSIS — Z510 Encounter for antineoplastic radiation therapy: Secondary | ICD-10-CM | POA: Diagnosis not present

## 2020-12-11 ENCOUNTER — Ambulatory Visit
Admission: RE | Admit: 2020-12-11 | Discharge: 2020-12-11 | Disposition: A | Discharge status: Home / Self Care | Payer: Medicare Other | Source: Ambulatory Visit | Attending: Radiation Oncology | Admitting: Radiation Oncology

## 2020-12-11 DIAGNOSIS — Z510 Encounter for antineoplastic radiation therapy: Secondary | ICD-10-CM | POA: Diagnosis not present

## 2020-12-12 ENCOUNTER — Other Ambulatory Visit: Payer: Self-pay

## 2020-12-12 ENCOUNTER — Ambulatory Visit
Admission: RE | Admit: 2020-12-12 | Discharge: 2020-12-12 | Disposition: A | Discharge status: Home / Self Care | Payer: Medicare Other | Source: Ambulatory Visit | Attending: Radiation Oncology | Admitting: Radiation Oncology

## 2020-12-12 DIAGNOSIS — Z510 Encounter for antineoplastic radiation therapy: Secondary | ICD-10-CM | POA: Diagnosis not present

## 2020-12-13 ENCOUNTER — Other Ambulatory Visit: Payer: Self-pay | Admitting: Hematology and Oncology

## 2020-12-13 ENCOUNTER — Inpatient Hospital Stay: Payer: Medicare Other

## 2020-12-13 ENCOUNTER — Other Ambulatory Visit: Payer: Self-pay | Admitting: *Deleted

## 2020-12-13 ENCOUNTER — Ambulatory Visit
Admission: RE | Admit: 2020-12-13 | Discharge: 2020-12-13 | Disposition: A | Discharge status: Home / Self Care | Payer: Medicare Other | Source: Ambulatory Visit | Attending: Radiation Oncology | Admitting: Radiation Oncology

## 2020-12-13 DIAGNOSIS — Z510 Encounter for antineoplastic radiation therapy: Secondary | ICD-10-CM | POA: Diagnosis not present

## 2020-12-13 DIAGNOSIS — D45 Polycythemia vera: Secondary | ICD-10-CM | POA: Diagnosis not present

## 2020-12-13 DIAGNOSIS — Z79899 Other long term (current) drug therapy: Secondary | ICD-10-CM | POA: Diagnosis not present

## 2020-12-13 LAB — CMP (CANCER CENTER ONLY)
ALT: 15 U/L (ref 0–44)
AST: 24 U/L (ref 15–41)
Albumin: 3.9 g/dL (ref 3.5–5.0)
Alkaline Phosphatase: 46 U/L (ref 38–126)
Anion gap: 10 (ref 5–15)
BUN: 25 mg/dL — ABNORMAL HIGH (ref 8–23)
CO2: 29 mmol/L (ref 22–32)
Calcium: 9.3 mg/dL (ref 8.9–10.3)
Chloride: 101 mmol/L (ref 98–111)
Creatinine: 1.12 mg/dL (ref 0.61–1.24)
GFR, Estimated: >60 mL/min (ref >60)
Glucose, Bld: 81 mg/dL (ref 70–99)
Potassium: 4.2 mmol/L (ref 3.5–5.1)
Sodium: 140 mmol/L (ref 135–145)
Total Bilirubin: 0.8 mg/dL (ref 0.3–1.2)
Total Protein: 7.1 g/dL (ref 6.5–8.1)

## 2020-12-13 LAB — CBC WITH DIFFERENTIAL (CANCER CENTER ONLY)
Abs Immature Granulocytes: 0.04 10*3/uL (ref 0.00–0.07)
Basophils Absolute: 0.1 10*3/uL (ref 0.0–0.1)
Basophils Relative: 1 %
Eosinophils Absolute: 0.1 10*3/uL (ref 0.0–0.5)
Eosinophils Relative: 2 %
HCT: 43.1 % (ref 39.0–52.0)
Hemoglobin: 15 g/dL (ref 13.0–17.0)
Immature Granulocytes: 1 %
Lymphocytes Relative: 18 %
Lymphs Abs: 1.4 10*3/uL (ref 0.7–4.0)
MCH: 34.2 pg — ABNORMAL HIGH (ref 26.0–34.0)
MCHC: 34.8 g/dL (ref 30.0–36.0)
MCV: 98.2 fL (ref 80.0–100.0)
Monocytes Absolute: 0.5 10*3/uL (ref 0.1–1.0)
Monocytes Relative: 6 %
Neutro Abs: 5.8 10*3/uL (ref 1.7–7.7)
Neutrophils Relative %: 72 %
Platelet Count: 314 10*3/uL (ref 150–400)
RBC: 4.39 MIL/uL (ref 4.22–5.81)
RDW: 19.9 % — ABNORMAL HIGH (ref 11.5–15.5)
WBC Count: 7.9 10*3/uL (ref 4.0–10.5)
nRBC: 0 % (ref 0.0–0.2)

## 2020-12-13 NOTE — Progress Notes (Signed)
Per Dr. Lorenso Courier, hold phlebotomy today d/t hematocrit being below goal. Patient states he is feeling well and has no concerns. Verbalized understanding of why we are holding phlebotomy today.

## 2020-12-14 ENCOUNTER — Ambulatory Visit
Admission: RE | Admit: 2020-12-14 | Discharge: 2020-12-14 | Disposition: A | Discharge status: Home / Self Care | Payer: Medicare Other | Source: Ambulatory Visit | Attending: Radiation Oncology | Admitting: Radiation Oncology

## 2020-12-14 DIAGNOSIS — Z510 Encounter for antineoplastic radiation therapy: Secondary | ICD-10-CM | POA: Diagnosis not present

## 2020-12-15 ENCOUNTER — Ambulatory Visit
Admission: RE | Admit: 2020-12-15 | Discharge: 2020-12-15 | Disposition: A | Discharge status: Home / Self Care | Payer: Medicare Other | Source: Ambulatory Visit | Attending: Radiation Oncology | Admitting: Radiation Oncology

## 2020-12-15 ENCOUNTER — Other Ambulatory Visit: Payer: Self-pay

## 2020-12-15 DIAGNOSIS — Z510 Encounter for antineoplastic radiation therapy: Secondary | ICD-10-CM | POA: Diagnosis not present

## 2020-12-18 ENCOUNTER — Other Ambulatory Visit: Payer: Self-pay

## 2020-12-18 ENCOUNTER — Ambulatory Visit
Admission: RE | Admit: 2020-12-18 | Discharge: 2020-12-18 | Disposition: A | Discharge status: Home / Self Care | Payer: Medicare Other | Source: Ambulatory Visit | Attending: Radiation Oncology | Admitting: Radiation Oncology

## 2020-12-18 DIAGNOSIS — Z510 Encounter for antineoplastic radiation therapy: Secondary | ICD-10-CM | POA: Diagnosis not present

## 2020-12-19 ENCOUNTER — Ambulatory Visit
Admission: RE | Admit: 2020-12-19 | Discharge: 2020-12-19 | Disposition: A | Discharge status: Home / Self Care | Payer: Medicare Other | Source: Ambulatory Visit | Attending: Radiation Oncology | Admitting: Radiation Oncology

## 2020-12-19 DIAGNOSIS — Z510 Encounter for antineoplastic radiation therapy: Secondary | ICD-10-CM | POA: Diagnosis not present

## 2020-12-20 ENCOUNTER — Ambulatory Visit
Admission: RE | Admit: 2020-12-20 | Discharge: 2020-12-20 | Disposition: A | Discharge status: Home / Self Care | Payer: Medicare Other | Source: Ambulatory Visit | Attending: Radiation Oncology | Admitting: Radiation Oncology

## 2020-12-20 ENCOUNTER — Other Ambulatory Visit: Payer: Self-pay

## 2020-12-20 DIAGNOSIS — Z510 Encounter for antineoplastic radiation therapy: Secondary | ICD-10-CM | POA: Diagnosis not present

## 2020-12-21 ENCOUNTER — Ambulatory Visit
Admission: RE | Admit: 2020-12-21 | Discharge: 2020-12-21 | Disposition: A | Discharge status: Home / Self Care | Payer: Medicare Other | Source: Ambulatory Visit | Attending: Radiation Oncology | Admitting: Radiation Oncology

## 2020-12-21 DIAGNOSIS — Z510 Encounter for antineoplastic radiation therapy: Secondary | ICD-10-CM | POA: Diagnosis not present

## 2020-12-22 ENCOUNTER — Ambulatory Visit
Admission: RE | Admit: 2020-12-22 | Discharge: 2020-12-22 | Disposition: A | Discharge status: Home / Self Care | Payer: Medicare Other | Source: Ambulatory Visit | Attending: Radiation Oncology | Admitting: Radiation Oncology

## 2020-12-22 DIAGNOSIS — Z510 Encounter for antineoplastic radiation therapy: Secondary | ICD-10-CM | POA: Diagnosis not present

## 2020-12-26 ENCOUNTER — Ambulatory Visit
Admission: RE | Admit: 2020-12-26 | Discharge: 2020-12-26 | Disposition: A | Discharge status: Home / Self Care | Payer: Medicare Other | Source: Ambulatory Visit | Attending: Radiation Oncology | Admitting: Radiation Oncology

## 2020-12-26 ENCOUNTER — Other Ambulatory Visit: Payer: Self-pay

## 2020-12-26 DIAGNOSIS — Z510 Encounter for antineoplastic radiation therapy: Secondary | ICD-10-CM | POA: Diagnosis not present

## 2020-12-26 NOTE — Progress Notes (Signed)
[place] [phone]   [phone]  PROGRESS NOTE  Patient Care Team: Chevis Pretty, FNP as PCP - General (Family Medicine) Minus Breeding, MD as PCP - Cardiology (Cardiology)  Hematological/Oncological History # Polycythemia Vera, JAK2 V617F positive # Thrombocytosis/ Leukocytosis 06/18/2019: WBC 12.8, Hgb 15.1, MCV 90, Plt 615 07/05/2019: WBC 10.5, Hgb 15.8, MCV 87, Plt 644 10/06/2019: WBC 10.6, Hgb 16.3, MCV 85, Plt 565 08/30/2020: establish care with Dr. Lorenso Courier  10/11/2020: started phebotomy q 2 weeks and hydroxyurea 517m BID 11/15/2020: WBC 9.0, Hgb 14.9, HCT 44.5%, Plt 384. Holding phlebotomies, patient at target.   #Hx of Prostate Cancer 2006: robotic resection of prostate.  11/16/2019: PSA 0.21([facility])  08/08/2020: PSA 0.36 ([facility]   Interval History:  Justin Laser80y.o. male with medical history significant for polycythemia vera who presents for a follow up visit. The patient's last visit was on 11/15/2020. In the interim since the last visit the patient has reached and stayed at his goal hematocrit of less than 45% hydroxyurea 500 mg twice daily.  On exam today Justin Gibbs reports he has been doing quite well in the interim since our last visit.  He reports that he is taking the hydroxyurea medication and is not having any difficulties with it.  He is not having any stomach upset, diarrhea, or nausea.  He also notes that he is not developing any ulcers in his mouth or on his lower extremities.  He reports that he is tolerating of the prostate quite well and is not having any major side effects other than some "occasional burning on urination".  He notes that he is scheduled to end this treatment on July 6.  He reports that his knees are better than ever and that he is not having any issues with shortness of breath.  He is quite pleased with the way his health has improved with the cytoreductive therapy.  He notes he has not had any  weakness, numbness, chest pain, shortness of breath, or lower extremity swelling.  A full 10 point ROS is listed below.   MEDICAL HISTORY:  Past Medical History:  Diagnosis Date  . Cancer ([facility]    prostate / removed 2006  . Depression   . High triglycerides   . Hypertension     SURGICAL HISTORY: Past Surgical History:  Procedure Laterality Date  . HERNIA REPAIR Bilateral    x 2   . PROSTATECTOMY      SOCIAL HISTORY: Social History   Socioeconomic History  . Marital status: Married    Spouse name: Dawn  . Number of children: 3  . Years of education: Not on file  . Highest education level: Not on file  Occupational History  . Occupation: retired     Comment: 2007  Tobacco Use  . Smoking status: Former Smoker    Packs/day: 1.00    Years: 23.00    Pack years: 23.00    Types: Cigarettes, Cigars    Quit date: 07/29/1981    Years since quitting: 39.4  . Smokeless tobacco: Never Used  Vaping Use  . Vaping Use: Never used  Substance and Sexual Activity  . Alcohol use: Never  . Drug use: Never  . Sexual activity: Not Currently  Other Topics Concern  . Not on file  Social History Narrative   Lives wife.  Son.     Social Determinants of Health   Financial Resource Strain: Not on file  Food Insecurity: Not on file  Transportation Needs: Not on file  Physical Activity: Not on file  Stress: Not on file  Social Connections: Not on file  Intimate Partner Violence: Not on file    FAMILY HISTORY: Family History  Problem Relation Age of Onset  . Alzheimer's disease Mother   . Alcohol abuse Father   . Cancer Father        leukemia  . Cirrhosis Father   . Stomach cancer Father   . Stroke Brother   . Diabetes Daughter   . Cancer Maternal Grandmother   . Cancer Paternal Grandfather   . Prostate cancer Brother   . Brain cancer Brother   . Thyroid cancer Sister     ALLERGIES:  has No Known Allergies.  MEDICATIONS:  Current Outpatient Medications  Medication  Sig Dispense Refill  . apixaban (ELIQUIS) 5 MG TABS tablet Take 1 tablet (5 mg total) by mouth 2 (two) times daily. 60 tablet 11  . hydroxyurea (HYDREA) 500 MG capsule Take 1 capsule (500 mg total) by mouth 2 (two) times daily. May take with food to minimize GI side effects. 180 capsule 1  . lisinopril-hydrochlorothiazide (ZESTORETIC) 20-12.5 MG tablet Take 2 tablets by mouth daily. 60 tablet 11  . metoprolol tartrate (LOPRESSOR) 25 MG tablet Take 1 tablet (25 mg total) by mouth 2 (two) times daily. 60 tablet 11  . Multiple Vitamin (MULTIVITAMIN) capsule Take 1 capsule by mouth daily.    Marland Kitchen PARoxetine (PAXIL) 10 MG tablet Take 0.5 tablets (5 mg total) by mouth 2 (two) times daily. 90 tablet 3   No current facility-administered medications for this visit.    REVIEW OF SYSTEMS:   Constitutional: ( - ) fevers, ( - )  chills , ( - ) night sweats Eyes: ( - ) blurriness of vision, ( - ) double vision, ( - ) watery eyes Ears, nose, mouth, throat, and face: ( - ) mucositis, ( - ) sore throat Respiratory: ( - ) cough, ( - ) dyspnea, ( - ) wheezes Cardiovascular: ( - ) palpitation, ( - ) chest discomfort, ( - ) lower extremity swelling Gastrointestinal:  ( - ) nausea, ( - ) heartburn, ( - ) change in bowel habits Skin: ( - ) abnormal skin rashes Lymphatics: ( - ) new lymphadenopathy, ( - ) easy bruising Neurological: ( - ) numbness, ( - ) tingling, ( - ) new weaknesses Behavioral/Psych: ( - ) mood change, ( - ) new changes  All other systems were reviewed with the patient and are negative.  PHYSICAL EXAMINATION:  Vitals:   12/27/20 1106  BP: (!) 159/89  Pulse: 69  Resp: 16  Temp: 97.9 F (36.6 C)  SpO2: 99%   Filed Weights   12/27/20 1106  Weight: 225 lb 3.2 oz (102.2 kg)    GENERAL: well appearing elderly Caucasian male alert, no distress and comfortable SKIN: skin color, texture, turgor are normal, no rashes or significant lesions EYES: conjunctiva are pink and non-injected, sclera  clear LUNGS: clear to auscultation and percussion with normal breathing effort HEART: regular rate & rhythm and no murmurs and no lower extremity edema Musculoskeletal: no cyanosis of digits and no clubbing  PSYCH: alert & oriented x 3, fluent speech NEURO: no focal motor/sensory deficits  LABORATORY DATA:  I have reviewed the data as listed CBC Latest Ref Rng & Units 12/27/2020 12/13/2020 11/29/2020  WBC 4.0 - 10.5 K/uL 6.5 7.9 8.3  Hemoglobin 13.0 - 17.0 g/dL 14.9 15.0 14.9  Hematocrit 39.0 - 52.0 %  42.8 43.1 44.4  Platelets 150 - 400 K/uL 240 314 340    CMP Latest Ref Rng & Units 12/27/2020 12/13/2020 11/29/2020  Glucose 70 - 99 mg/dL 81 81 97  BUN 8 - 23 mg/dL 30(H) 25(H) 31(H)  Creatinine 0.61 - 1.24 mg/dL 1.17 1.12 1.45(H)  Sodium 135 - 145 mmol/L 142 140 141  Potassium 3.5 - 5.1 mmol/L 4.5 4.2 4.2  Chloride 98 - 111 mmol/L 102 101 103  CO2 22 - 32 mmol/L 28 29 29   Calcium 8.9 - 10.3 mg/dL 9.7 9.3 9.3  Total Protein 6.5 - 8.1 g/dL 7.4 7.1 7.2  Total Bilirubin 0.3 - 1.2 mg/dL 0.7 0.8 0.8  Alkaline Phos 38 - 126 U/L 47 46 49  AST 15 - 41 U/L 20 24 18   ALT 0 - 44 U/L 17 15 14     No results found for: MPROTEIN No results found for: KPAFRELGTCHN, LAMBDASER, KAPLAMBRATIO  Pathology:    RADIOGRAPHIC STUDIES: No results found.  ASSESSMENT & PLAN Justin Gibbs 80 y.o. male with medical history significant for polycythemia vera who presents for a follow up visit.   After review of the labs, the records, and discussion with the patient the findings most consistent with a polycythemia vera which is JAK2 V617 F positive.  These results were confirmed with a bone marrow biopsy.  Given the patient's age greater than 11 he is considered a high risk polycythemia vera and therefore would require cytoreductive therapy with hydroxyurea 500 mg twice daily in addition to every 2 week phlebotomies.  Goal hematocrit for this patient is less than 45%.  Additionally he is on Eliquis therapy which  will serve as anticoagulation for thromboprophylaxis.  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  Continue to HCT goal of <45% --patient currently at goal HCT, will hold further phlebotomies unless needed.  --holding ASA 29m PO daily at the patient is on eliquis.  --baseline iron labs from 08/30/2020 show ferritin of 193 --RTC in 4 weeks for labs and 8 weeks for next clinic visit to continue monitoring  No orders of the defined types were placed in this encounter.   All questions were answered. The patient knows to call the clinic with any problems, questions or concerns.  A total of more than 30 minutes were spent on this encounter and over half of that time was spent on counseling and coordination of care as outlined above.   JLedell Peoples MD Department of Hematology/Oncology CMansfieldat [facility]osaPhone: [phone]: [phone]: [email or web address]  12/27/2020 12:58 PM

## 2020-12-27 ENCOUNTER — Inpatient Hospital Stay: Payer: Medicare Other

## 2020-12-27 ENCOUNTER — Encounter: Payer: Self-pay | Admitting: Hematology and Oncology

## 2020-12-27 ENCOUNTER — Inpatient Hospital Stay: Payer: Medicare Other | Attending: Hematology and Oncology | Admitting: Hematology and Oncology

## 2020-12-27 ENCOUNTER — Ambulatory Visit
Admission: RE | Admit: 2020-12-27 | Discharge: 2020-12-27 | Disposition: A | Discharge status: Home / Self Care | Payer: Medicare Other | Source: Ambulatory Visit | Attending: Radiation Oncology | Admitting: Radiation Oncology

## 2020-12-27 VITALS — BP 159/89 | HR 69 | Temp 97.9°F | Resp 16 | Ht 69.0 in | Wt 225.2 lb

## 2020-12-27 DIAGNOSIS — D45 Polycythemia vera: Secondary | ICD-10-CM

## 2020-12-27 DIAGNOSIS — Z87891 Personal history of nicotine dependence: Secondary | ICD-10-CM | POA: Insufficient documentation

## 2020-12-27 DIAGNOSIS — Z510 Encounter for antineoplastic radiation therapy: Secondary | ICD-10-CM | POA: Diagnosis not present

## 2020-12-27 DIAGNOSIS — Z7901 Long term (current) use of anticoagulants: Secondary | ICD-10-CM | POA: Insufficient documentation

## 2020-12-27 DIAGNOSIS — C61 Malignant neoplasm of prostate: Secondary | ICD-10-CM | POA: Diagnosis present

## 2020-12-27 DIAGNOSIS — I10 Essential (primary) hypertension: Secondary | ICD-10-CM | POA: Diagnosis not present

## 2020-12-27 DIAGNOSIS — Z79899 Other long term (current) drug therapy: Secondary | ICD-10-CM | POA: Insufficient documentation

## 2020-12-27 DIAGNOSIS — D75839 Thrombocytosis, unspecified: Secondary | ICD-10-CM

## 2020-12-27 LAB — CMP (CANCER CENTER ONLY)
ALT: 17 U/L (ref 0–44)
AST: 20 U/L (ref 15–41)
Albumin: 4 g/dL (ref 3.5–5.0)
Alkaline Phosphatase: 47 U/L (ref 38–126)
Anion gap: 12 (ref 5–15)
BUN: 30 mg/dL — ABNORMAL HIGH (ref 8–23)
CO2: 28 mmol/L (ref 22–32)
Calcium: 9.7 mg/dL (ref 8.9–10.3)
Chloride: 102 mmol/L (ref 98–111)
Creatinine: 1.17 mg/dL (ref 0.61–1.24)
GFR, Estimated: >60 mL/min (ref >60)
Glucose, Bld: 81 mg/dL (ref 70–99)
Potassium: 4.5 mmol/L (ref 3.5–5.1)
Sodium: 142 mmol/L (ref 135–145)
Total Bilirubin: 0.7 mg/dL (ref 0.3–1.2)
Total Protein: 7.4 g/dL (ref 6.5–8.1)

## 2020-12-27 LAB — CBC WITH DIFFERENTIAL (CANCER CENTER ONLY)
Abs Immature Granulocytes: 0.03 10*3/uL (ref 0.00–0.07)
Basophils Absolute: 0.1 10*3/uL (ref 0.0–0.1)
Basophils Relative: 1 %
Eosinophils Absolute: 0.1 10*3/uL (ref 0.0–0.5)
Eosinophils Relative: 2 %
HCT: 42.8 % (ref 39.0–52.0)
Hemoglobin: 14.9 g/dL (ref 13.0–17.0)
Immature Granulocytes: 1 %
Lymphocytes Relative: 17 %
Lymphs Abs: 1.1 10*3/uL (ref 0.7–4.0)
MCH: 35.1 pg — ABNORMAL HIGH (ref 26.0–34.0)
MCHC: 34.8 g/dL (ref 30.0–36.0)
MCV: 100.9 fL — ABNORMAL HIGH (ref 80.0–100.0)
Monocytes Absolute: 0.6 10*3/uL (ref 0.1–1.0)
Monocytes Relative: 9 %
Neutro Abs: 4.7 10*3/uL (ref 1.7–7.7)
Neutrophils Relative %: 70 %
Platelet Count: 240 10*3/uL (ref 150–400)
RBC: 4.24 MIL/uL (ref 4.22–5.81)
RDW: 18 % — ABNORMAL HIGH (ref 11.5–15.5)
WBC Count: 6.5 10*3/uL (ref 4.0–10.5)
nRBC: 0 % (ref 0.0–0.2)

## 2020-12-27 LAB — FERRITIN: Ferritin: 69 ng/mL (ref 24–336)

## 2020-12-27 MED ORDER — HYDROXYUREA 500 MG PO CAPS: 500.0000 mg | ORAL_CAPSULE | Freq: Two times a day (BID) | ORAL | 1 refills | Status: DC

## 2020-12-28 ENCOUNTER — Other Ambulatory Visit: Payer: Self-pay

## 2020-12-28 ENCOUNTER — Ambulatory Visit
Admission: RE | Admit: 2020-12-28 | Discharge: 2020-12-28 | Disposition: A | Discharge status: Home / Self Care | Payer: Medicare Other | Source: Ambulatory Visit | Attending: Radiation Oncology | Admitting: Radiation Oncology

## 2020-12-28 ENCOUNTER — Telehealth: Payer: Self-pay | Admitting: Hematology and Oncology

## 2020-12-28 DIAGNOSIS — Z510 Encounter for antineoplastic radiation therapy: Secondary | ICD-10-CM | POA: Diagnosis not present

## 2020-12-28 NOTE — Telephone Encounter (Signed)
Scheduled per los. Called and spoke with patient. Confirmed appts  

## 2020-12-29 ENCOUNTER — Ambulatory Visit
Admission: RE | Admit: 2020-12-29 | Discharge: 2020-12-29 | Disposition: A | Discharge status: Home / Self Care | Payer: Medicare Other | Source: Ambulatory Visit | Attending: Radiation Oncology | Admitting: Radiation Oncology

## 2020-12-29 DIAGNOSIS — Z510 Encounter for antineoplastic radiation therapy: Secondary | ICD-10-CM | POA: Diagnosis not present

## 2021-01-01 ENCOUNTER — Other Ambulatory Visit: Payer: Self-pay

## 2021-01-01 ENCOUNTER — Ambulatory Visit
Admission: RE | Admit: 2021-01-01 | Discharge: 2021-01-01 | Disposition: A | Discharge status: Home / Self Care | Payer: Medicare Other | Source: Ambulatory Visit | Attending: Radiation Oncology | Admitting: Radiation Oncology

## 2021-01-01 DIAGNOSIS — Z510 Encounter for antineoplastic radiation therapy: Secondary | ICD-10-CM | POA: Diagnosis not present

## 2021-01-02 ENCOUNTER — Ambulatory Visit
Admission: RE | Admit: 2021-01-02 | Discharge: 2021-01-02 | Disposition: A | Discharge status: Home / Self Care | Payer: Medicare Other | Source: Ambulatory Visit | Attending: Radiation Oncology | Admitting: Radiation Oncology

## 2021-01-02 DIAGNOSIS — Z510 Encounter for antineoplastic radiation therapy: Secondary | ICD-10-CM | POA: Diagnosis not present

## 2021-01-03 ENCOUNTER — Ambulatory Visit
Admission: RE | Admit: 2021-01-03 | Discharge: 2021-01-03 | Disposition: A | Discharge status: Home / Self Care | Payer: Medicare Other | Source: Ambulatory Visit | Attending: Radiation Oncology | Admitting: Radiation Oncology

## 2021-01-03 ENCOUNTER — Other Ambulatory Visit: Payer: Self-pay

## 2021-01-03 DIAGNOSIS — Z510 Encounter for antineoplastic radiation therapy: Secondary | ICD-10-CM | POA: Diagnosis not present

## 2021-01-04 ENCOUNTER — Ambulatory Visit
Admission: RE | Admit: 2021-01-04 | Discharge: 2021-01-04 | Disposition: A | Discharge status: Home / Self Care | Payer: Medicare Other | Source: Ambulatory Visit | Attending: Radiation Oncology | Admitting: Radiation Oncology

## 2021-01-04 DIAGNOSIS — Z510 Encounter for antineoplastic radiation therapy: Secondary | ICD-10-CM | POA: Diagnosis not present

## 2021-01-05 ENCOUNTER — Other Ambulatory Visit: Payer: Self-pay

## 2021-01-05 ENCOUNTER — Ambulatory Visit
Admission: RE | Admit: 2021-01-05 | Discharge: 2021-01-05 | Disposition: A | Discharge status: Home / Self Care | Payer: Medicare Other | Source: Ambulatory Visit | Attending: Radiation Oncology | Admitting: Radiation Oncology

## 2021-01-05 DIAGNOSIS — Z510 Encounter for antineoplastic radiation therapy: Secondary | ICD-10-CM | POA: Diagnosis not present

## 2021-01-08 ENCOUNTER — Ambulatory Visit
Admission: RE | Admit: 2021-01-08 | Discharge: 2021-01-08 | Disposition: A | Discharge status: Home / Self Care | Payer: Medicare Other | Source: Ambulatory Visit | Attending: Radiation Oncology | Admitting: Radiation Oncology

## 2021-01-08 ENCOUNTER — Other Ambulatory Visit: Payer: Self-pay

## 2021-01-08 DIAGNOSIS — Z510 Encounter for antineoplastic radiation therapy: Secondary | ICD-10-CM | POA: Diagnosis not present

## 2021-01-09 ENCOUNTER — Ambulatory Visit
Admission: RE | Admit: 2021-01-09 | Discharge: 2021-01-09 | Disposition: A | Discharge status: Home / Self Care | Payer: Medicare Other | Source: Ambulatory Visit | Attending: Radiation Oncology | Admitting: Radiation Oncology

## 2021-01-09 DIAGNOSIS — Z510 Encounter for antineoplastic radiation therapy: Secondary | ICD-10-CM | POA: Diagnosis not present

## 2021-01-10 ENCOUNTER — Ambulatory Visit
Admission: RE | Admit: 2021-01-10 | Discharge: 2021-01-10 | Disposition: A | Discharge status: Home / Self Care | Payer: Medicare Other | Source: Ambulatory Visit | Attending: Radiation Oncology | Admitting: Radiation Oncology

## 2021-01-10 ENCOUNTER — Other Ambulatory Visit: Payer: Self-pay

## 2021-01-10 DIAGNOSIS — Z510 Encounter for antineoplastic radiation therapy: Secondary | ICD-10-CM | POA: Diagnosis not present

## 2021-01-11 ENCOUNTER — Ambulatory Visit
Admission: RE | Admit: 2021-01-11 | Discharge: 2021-01-11 | Disposition: A | Discharge status: Home / Self Care | Payer: Medicare Other | Source: Ambulatory Visit | Attending: Radiation Oncology | Admitting: Radiation Oncology

## 2021-01-11 DIAGNOSIS — Z510 Encounter for antineoplastic radiation therapy: Secondary | ICD-10-CM | POA: Diagnosis not present

## 2021-01-12 ENCOUNTER — Ambulatory Visit
Admission: RE | Admit: 2021-01-12 | Discharge: 2021-01-12 | Disposition: A | Discharge status: Home / Self Care | Payer: Medicare Other | Source: Ambulatory Visit | Attending: Radiation Oncology | Admitting: Radiation Oncology

## 2021-01-12 ENCOUNTER — Other Ambulatory Visit: Payer: Self-pay

## 2021-01-12 DIAGNOSIS — Z510 Encounter for antineoplastic radiation therapy: Secondary | ICD-10-CM | POA: Diagnosis not present

## 2021-01-15 ENCOUNTER — Ambulatory Visit
Admission: RE | Admit: 2021-01-15 | Discharge: 2021-01-15 | Disposition: A | Discharge status: Home / Self Care | Payer: Medicare Other | Source: Ambulatory Visit | Attending: Radiation Oncology | Admitting: Radiation Oncology

## 2021-01-15 ENCOUNTER — Other Ambulatory Visit: Payer: Self-pay

## 2021-01-15 DIAGNOSIS — Z510 Encounter for antineoplastic radiation therapy: Secondary | ICD-10-CM | POA: Diagnosis not present

## 2021-01-16 ENCOUNTER — Ambulatory Visit
Admission: RE | Admit: 2021-01-16 | Discharge: 2021-01-16 | Disposition: A | Discharge status: Home / Self Care | Payer: Medicare Other | Source: Ambulatory Visit | Attending: Radiation Oncology | Admitting: Radiation Oncology

## 2021-01-16 DIAGNOSIS — Z510 Encounter for antineoplastic radiation therapy: Secondary | ICD-10-CM | POA: Diagnosis not present

## 2021-01-17 ENCOUNTER — Ambulatory Visit
Admission: RE | Admit: 2021-01-17 | Discharge: 2021-01-17 | Disposition: A | Discharge status: Home / Self Care | Payer: Medicare Other | Source: Ambulatory Visit | Attending: Radiation Oncology | Admitting: Radiation Oncology

## 2021-01-17 ENCOUNTER — Other Ambulatory Visit: Payer: Self-pay

## 2021-01-17 DIAGNOSIS — Z510 Encounter for antineoplastic radiation therapy: Secondary | ICD-10-CM | POA: Diagnosis not present

## 2021-01-18 ENCOUNTER — Ambulatory Visit
Admission: RE | Admit: 2021-01-18 | Discharge: 2021-01-18 | Disposition: A | Discharge status: Home / Self Care | Payer: Medicare Other | Source: Ambulatory Visit | Attending: Radiation Oncology | Admitting: Radiation Oncology

## 2021-01-18 DIAGNOSIS — Z510 Encounter for antineoplastic radiation therapy: Secondary | ICD-10-CM | POA: Diagnosis not present

## 2021-01-19 ENCOUNTER — Other Ambulatory Visit: Payer: Self-pay

## 2021-01-19 ENCOUNTER — Ambulatory Visit
Admission: RE | Admit: 2021-01-19 | Discharge: 2021-01-19 | Disposition: A | Discharge status: Home / Self Care | Payer: Medicare Other | Source: Ambulatory Visit | Attending: Radiation Oncology | Admitting: Radiation Oncology

## 2021-01-19 DIAGNOSIS — Z510 Encounter for antineoplastic radiation therapy: Secondary | ICD-10-CM | POA: Diagnosis not present

## 2021-01-22 ENCOUNTER — Ambulatory Visit
Admission: RE | Admit: 2021-01-22 | Discharge: 2021-01-22 | Disposition: A | Discharge status: Home / Self Care | Payer: Medicare Other | Source: Ambulatory Visit | Attending: Radiation Oncology | Admitting: Radiation Oncology

## 2021-01-22 ENCOUNTER — Other Ambulatory Visit: Payer: Self-pay

## 2021-01-22 DIAGNOSIS — Z510 Encounter for antineoplastic radiation therapy: Secondary | ICD-10-CM | POA: Diagnosis not present

## 2021-01-23 ENCOUNTER — Ambulatory Visit
Admission: RE | Admit: 2021-01-23 | Discharge: 2021-01-23 | Disposition: A | Discharge status: Home / Self Care | Payer: Medicare Other | Source: Ambulatory Visit | Attending: Radiation Oncology | Admitting: Radiation Oncology

## 2021-01-23 DIAGNOSIS — Z510 Encounter for antineoplastic radiation therapy: Secondary | ICD-10-CM | POA: Diagnosis not present

## 2021-01-24 ENCOUNTER — Ambulatory Visit
Admission: RE | Admit: 2021-01-24 | Discharge: 2021-01-24 | Disposition: A | Discharge status: Home / Self Care | Payer: Medicare Other | Source: Ambulatory Visit | Attending: Radiation Oncology | Admitting: Radiation Oncology

## 2021-01-24 ENCOUNTER — Other Ambulatory Visit: Payer: Self-pay | Admitting: Hematology and Oncology

## 2021-01-24 ENCOUNTER — Inpatient Hospital Stay: Payer: Medicare Other

## 2021-01-24 ENCOUNTER — Other Ambulatory Visit: Payer: Self-pay

## 2021-01-24 DIAGNOSIS — Z510 Encounter for antineoplastic radiation therapy: Secondary | ICD-10-CM | POA: Diagnosis not present

## 2021-01-24 DIAGNOSIS — D45 Polycythemia vera: Secondary | ICD-10-CM | POA: Diagnosis not present

## 2021-01-24 DIAGNOSIS — Z87891 Personal history of nicotine dependence: Secondary | ICD-10-CM | POA: Diagnosis not present

## 2021-01-24 DIAGNOSIS — Z7901 Long term (current) use of anticoagulants: Secondary | ICD-10-CM | POA: Diagnosis not present

## 2021-01-24 DIAGNOSIS — Z79899 Other long term (current) drug therapy: Secondary | ICD-10-CM | POA: Diagnosis not present

## 2021-01-24 DIAGNOSIS — I10 Essential (primary) hypertension: Secondary | ICD-10-CM | POA: Diagnosis not present

## 2021-01-24 LAB — CBC WITH DIFFERENTIAL (CANCER CENTER ONLY)
Abs Immature Granulocytes: 0.02 10*3/uL (ref 0.00–0.07)
Basophils Absolute: 0.1 10*3/uL (ref 0.0–0.1)
Basophils Relative: 1 %
Eosinophils Absolute: 0.1 10*3/uL (ref 0.0–0.5)
Eosinophils Relative: 1 %
HCT: 43.6 % (ref 39.0–52.0)
Hemoglobin: 15.4 g/dL (ref 13.0–17.0)
Immature Granulocytes: 0 %
Lymphocytes Relative: 11 %
Lymphs Abs: 0.7 10*3/uL (ref 0.7–4.0)
MCH: 36.7 pg — ABNORMAL HIGH (ref 26.0–34.0)
MCHC: 35.3 g/dL (ref 30.0–36.0)
MCV: 103.8 fL — ABNORMAL HIGH (ref 80.0–100.0)
Monocytes Absolute: 0.5 10*3/uL (ref 0.1–1.0)
Monocytes Relative: 9 %
Neutro Abs: 4.7 10*3/uL (ref 1.7–7.7)
Neutrophils Relative %: 78 %
Platelet Count: 200 10*3/uL (ref 150–400)
RBC: 4.2 MIL/uL — ABNORMAL LOW (ref 4.22–5.81)
RDW: 15.1 % (ref 11.5–15.5)
WBC Count: 6.1 10*3/uL (ref 4.0–10.5)
nRBC: 0 % (ref 0.0–0.2)

## 2021-01-24 LAB — CMP (CANCER CENTER ONLY)
ALT: 19 U/L (ref 0–44)
AST: 21 U/L (ref 15–41)
Albumin: 3.7 g/dL (ref 3.5–5.0)
Alkaline Phosphatase: 46 U/L (ref 38–126)
Anion gap: 10 (ref 5–15)
BUN: 32 mg/dL — ABNORMAL HIGH (ref 8–23)
CO2: 29 mmol/L (ref 22–32)
Calcium: 9.3 mg/dL (ref 8.9–10.3)
Chloride: 103 mmol/L (ref 98–111)
Creatinine: 1.37 mg/dL — ABNORMAL HIGH (ref 0.61–1.24)
GFR, Estimated: 53 mL/min — ABNORMAL LOW (ref >60)
Glucose, Bld: 92 mg/dL (ref 70–99)
Potassium: 4 mmol/L (ref 3.5–5.1)
Sodium: 142 mmol/L (ref 135–145)
Total Bilirubin: 0.7 mg/dL (ref 0.3–1.2)
Total Protein: 7 g/dL (ref 6.5–8.1)

## 2021-01-24 LAB — FERRITIN: Ferritin: 77 ng/mL (ref 24–336)

## 2021-01-25 ENCOUNTER — Ambulatory Visit
Admission: RE | Admit: 2021-01-25 | Discharge: 2021-01-25 | Disposition: A | Discharge status: Home / Self Care | Payer: Medicare Other | Source: Ambulatory Visit | Attending: Radiation Oncology | Admitting: Radiation Oncology

## 2021-01-25 DIAGNOSIS — Z510 Encounter for antineoplastic radiation therapy: Secondary | ICD-10-CM | POA: Diagnosis not present

## 2021-01-26 ENCOUNTER — Other Ambulatory Visit: Payer: Self-pay

## 2021-01-26 ENCOUNTER — Ambulatory Visit
Admission: RE | Admit: 2021-01-26 | Discharge: 2021-01-26 | Disposition: A | Discharge status: Home / Self Care | Payer: Medicare Other | Source: Ambulatory Visit | Attending: Radiation Oncology | Admitting: Radiation Oncology

## 2021-01-26 DIAGNOSIS — Z510 Encounter for antineoplastic radiation therapy: Secondary | ICD-10-CM | POA: Diagnosis not present

## 2021-01-26 DIAGNOSIS — C61 Malignant neoplasm of prostate: Secondary | ICD-10-CM | POA: Diagnosis present

## 2021-01-29 ENCOUNTER — Ambulatory Visit: Payer: Medicare Other

## 2021-01-30 ENCOUNTER — Other Ambulatory Visit: Payer: Self-pay

## 2021-01-30 ENCOUNTER — Ambulatory Visit: Payer: Medicare Other

## 2021-01-30 ENCOUNTER — Ambulatory Visit
Admission: RE | Admit: 2021-01-30 | Discharge: 2021-01-30 | Disposition: A | Discharge status: Home / Self Care | Payer: Medicare Other | Source: Ambulatory Visit | Attending: Radiation Oncology | Admitting: Radiation Oncology

## 2021-01-30 DIAGNOSIS — Z510 Encounter for antineoplastic radiation therapy: Secondary | ICD-10-CM | POA: Diagnosis not present

## 2021-01-31 ENCOUNTER — Encounter: Payer: Self-pay | Admitting: Urology

## 2021-01-31 ENCOUNTER — Ambulatory Visit (INDEPENDENT_AMBULATORY_CARE_PROVIDER_SITE_OTHER): Payer: Medicare Other

## 2021-01-31 ENCOUNTER — Ambulatory Visit
Admission: RE | Admit: 2021-01-31 | Discharge: 2021-01-31 | Disposition: A | Discharge status: Home / Self Care | Payer: Medicare Other | Source: Ambulatory Visit | Attending: Radiation Oncology | Admitting: Radiation Oncology

## 2021-01-31 VITALS — Ht 69.0 in | Wt 225.0 lb

## 2021-01-31 DIAGNOSIS — Z0000 Encounter for general adult medical examination without abnormal findings: Secondary | ICD-10-CM | POA: Diagnosis not present

## 2021-01-31 DIAGNOSIS — Z510 Encounter for antineoplastic radiation therapy: Secondary | ICD-10-CM | POA: Diagnosis not present

## 2021-01-31 DIAGNOSIS — C61 Malignant neoplasm of prostate: Secondary | ICD-10-CM

## 2021-01-31 NOTE — Progress Notes (Signed)
Subjective:   Justin Gibbs is a 80 y.o. male who presents for an Initial Medicare Annual Wellness Visit.  Virtual Visit via Telephone Note  I connected with  [name] on 01/31/21 at  2:45 PM EDT by telephone and verified that I am speaking with the correct person using two identifiers.  Location: Patient: Home Provider: [facility] Persons participating in the virtual visit: patient/Nurse Health Advisor   I discussed the limitations, risks, security and privacy concerns of performing an evaluation and management service by telephone and the availability of in person appointments. The patient expressed understanding and agreed to proceed.  Interactive audio and video telecommunications were attempted between this nurse and patient, however failed, due to patient having technical difficulties OR patient did not have access to video capability.  We continued and completed visit with audio only.  Some vital signs may be absent or patient reported.   Marijke Guadiana E Anndrea Mihelich, LPN   Review of Systems     Cardiac Risk Factors include: advanced age (>78men, >34 women);obesity (BMI >30kg/m2);sedentary lifestyle;dyslipidemia;male gender;hypertension     Objective:    Today's Vitals   01/31/21 1445  Weight: 225 lb (102.1 kg)  Height: 5\' 9"  (1.753 m)   Body mass index is 33.23 kg/m.  Advanced Directives 01/31/2021 11/28/2020 10/18/2020 10/06/2020 06/18/2019  Does Patient Have a Medical Advance Directive? Yes No No No No  Type of Paramedic of [place];Living will - - - -  Copy of [place] in Chart? No - copy requested - - - -  Would patient like information on creating a medical advance directive? - No - Patient declined No - Patient declined No - Patient declined No - Patient declined    Current Medications (verified) Outpatient Encounter Medications as of 01/31/2021  Medication Sig   apixaban (ELIQUIS) 5 MG TABS tablet Take 1 tablet (5 mg total) by  mouth 2 (two) times daily.   hydroxyurea (HYDREA) 500 MG capsule Take 1 capsule (500 mg total) by mouth 2 (two) times daily. May take with food to minimize GI side effects.   lisinopril-hydrochlorothiazide (ZESTORETIC) 20-12.5 MG tablet Take 2 tablets by mouth daily.   metoprolol tartrate (LOPRESSOR) 25 MG tablet Take 1 tablet (25 mg total) by mouth 2 (two) times daily.   Multiple Vitamin (MULTIVITAMIN) capsule Take 1 capsule by mouth daily.   PARoxetine (PAXIL) 10 MG tablet Take 0.5 tablets (5 mg total) by mouth 2 (two) times daily.   No facility-administered encounter medications on file as of 01/31/2021.    Allergies (verified) Patient has no known allergies.   History: Past Medical History:  Diagnosis Date   Anxiety    Arthritis    Cancer ([place])    prostate / removed 2006   Depression    High triglycerides    Hypertension    Myocardial infarction [facility])    Past Surgical History:  Procedure Laterality Date   HERNIA REPAIR Bilateral    x 2    PROSTATECTOMY     Family History  Problem Relation Age of Onset   Alzheimer's disease Mother    Alcohol abuse Father    Cancer Father        leukemia   Cirrhosis Father    Stomach cancer Father    Stroke Brother    Diabetes Daughter    Cancer Maternal Grandmother    Cancer Paternal Grandfather    Prostate cancer Brother    Brain cancer Brother  Thyroid cancer Sister    Social History   Socioeconomic History   Marital status: Married    Spouse name: Dawn   Number of children: 3   Years of education: Not on file   Highest education level: Not on file  Occupational History   Occupation: retired     Comment: 2007  Tobacco Use   Smoking status: Former    Packs/day: 1.00    Years: 23.00    Pack years: 23.00    Types: Cigarettes, Cigars    Quit date: 06/12/1982    Years since quitting: 38.6   Smokeless tobacco: Never   Tobacco comments:    I was a light smoker  Vaping Use   Vaping Use: Never used  Substance and  Sexual Activity   Alcohol use: Never   Drug use: Never   Sexual activity: Not Currently    Comment: Prostate removal  Nov. 15 2006  Other Topics Concern   Not on file  Social History Narrative   Lives with wife and son. He has a basement with a wood stove.   Social Determinants of Health   Financial Resource Strain: Low Risk    Difficulty of Paying Living Expenses: Not hard at all  Food Insecurity: No Food Insecurity   Worried About Charity fundraiser in the Last Year: Never true   [place] in the Last Year: Never true  Transportation Needs: No Transportation Needs   Lack of Transportation (Medical): No   Lack of Transportation (Non-Medical): No  Physical Activity: Sufficiently Active   Days of Exercise per Week: 7 days   Minutes of Exercise per Session: 30 min  Stress: No Stress Concern Present   Feeling of Stress : Only a little  Social Connections: Moderately Integrated   Frequency of Communication with Friends and Family: More than three times a week   Frequency of Social Gatherings with Friends and Family: More than three times a week   Attends Religious Services: More than 4 times per year   Active Member of [employer] or Organizations: No   Attends Music therapist: Never   Marital Status: Married    Tobacco Counseling Counseling given: Not Answered Tobacco comments: I was a light smoker   Clinical Intake:  Pre-visit preparation completed: Yes  Pain : No/denies pain     BMI - recorded: 33.23 Nutritional Status: BMI > 30  Obese Nutritional Risks: None Diabetes: No  How often do you need to have someone help you when you read instructions, pamphlets, or other written materials from your doctor or pharmacy?: 1 - Never  Diabetic? No  Interpreter Needed?: No  Information entered by :: Brenin Heidelberger, LPN   Activities of Daily Living In your present state of health, do you have any difficulty performing the following activities: 01/31/2021   Hearing? Y  Vision? N  Difficulty concentrating or making decisions? N  Walking or climbing stairs? N  Dressing or bathing? N  Doing errands, shopping? N  Preparing Food and eating ? N  Using the Toilet? N  In the past six months, have you accidently leaked urine? N  Do you have problems with loss of bowel control? N  Managing your Medications? N  Managing your Finances? N  Housekeeping or managing your Housekeeping? N  Some recent data might be hidden    Patient Care Team: Chevis Pretty, FNP as PCP - General (Family Medicine) Minus Breeding, MD as PCP - Cardiology (Cardiology)  Indicate  any recent Medical Services you may have received from other than [facility] providers in the past year (date may be approximate).     Assessment:   This is a routine wellness examination for [place].  Hearing/Vision screen Hearing Screening - Comments:: Wears hearing aids from [place]:: Wears reading glasses prn - behind on eye exams.  Dietary issues and exercise activities discussed: Current Exercise Habits: Home exercise routine, Type of exercise: walking, Time (Minutes): 30, Frequency (Times/Week): 7, Weekly Exercise (Minutes/Week): 210, Intensity: Mild, Exercise limited by: cardiac condition(s)   Goals Addressed             This Visit's Progress    Exercise 3x per week (30 min per time)         Depression Screen PHQ 2/9 Scores 01/31/2021 08/10/2020 02/08/2020 10/06/2019 07/05/2019  PHQ - 2 Score 0 0 0 0 0    Fall Risk Fall Risk  01/31/2021 08/10/2020 02/08/2020 10/06/2019 07/05/2019  Falls in the past year? 0 0 0 0 0  Number falls in past yr: 0 - - - -  Injury with Fall? 0 - - - -  Risk for fall due to : No Fall Risks - - - -  Follow up Falls prevention discussed - - - -    FALL RISK PREVENTION PERTAINING TO THE HOME:  Any stairs in or around the home? Yes  If so, are there any without handrails? No  Home free of loose throw rugs in walkways, pet beds,  electrical cords, etc? Yes  Adequate lighting in your home to reduce risk of falls? Yes   ASSISTIVE DEVICES UTILIZED TO PREVENT FALLS:  Life alert? No  Use of a cane, walker or w/c? No  Grab bars in the bathroom? No  Shower chair or bench in shower? No  Elevated toilet seat or a handicapped toilet? No   TIMED UP AND GO:  Was the test performed? No . Telephonic visit  Cognitive Function: Normal cognitive status assessed by direct observation by this Nurse Health Advisor. No abnormalities found.       6CIT Screen 01/31/2021  What Year? 0 points  What month? 0 points  What time? 0 points  Count back from 20 0 points  Months in reverse 2 points  Repeat phrase 2 points  Total Score 4    Immunizations Immunization History  Administered Date(s) Administered   Influenza,inj,Quad PF,6+ Mos 05/18/2019   Influenza-Unspecified 06/08/2020   PFIZER(Purple Top)SARS-COV-2 Vaccination 11/11/2019, 12/03/2019   Pneumococcal Conjugate-13 07/05/2019   Pneumococcal Polysaccharide-23 08/10/2020    TDAP status: Due, Education has been provided regarding the importance of this vaccine. Advised may receive this vaccine at local pharmacy or Health Dept. Aware to provide a copy of the vaccination record if obtained from local pharmacy or Health Dept. Verbalized acceptance and understanding.  Flu Vaccine status: Up to date  Pneumococcal vaccine status: Up to date  Covid-19 vaccine status: Completed vaccines  Qualifies for Shingles Vaccine? Yes   Zostavax completed No   Shingrix Completed?: No.    Education has been provided regarding the importance of this vaccine. Patient has been advised to call insurance company to determine out of pocket expense if they have not yet received this vaccine. Advised may also receive vaccine at local pharmacy or Health Dept. Verbalized acceptance and understanding.  Screening Tests Health Maintenance  Topic Date Due   Zoster Vaccines- Shingrix (1 of 2) Never  done   COVID-19 Vaccine (3 - Pfizer risk  series) 12/31/2019   TETANUS/TDAP  08/10/2021 (Originally 02/03/1960)   Hepatitis C Screening  08/10/2021 (Originally 02/03/1959)   INFLUENZA VACCINE  02/26/2021   PNA vac Low Risk Adult  Completed   HPV VACCINES  Aged Out    Health Maintenance  Health Maintenance Due  Topic Date Due   Zoster Vaccines- Shingrix (1 of 2) Never done   COVID-19 Vaccine (3 - Pfizer risk series) 12/31/2019    Colorectal cancer screening: No longer required.   Lung Cancer Screening: (Low Dose CT Chest recommended if Age 65-80 years, 30 pack-year currently smoking OR have quit w/in 15years.) does not qualify.   Additional Screening:  Hepatitis C Screening: does not qualify  Vision Screening: Recommended annual ophthalmology exams for early detection of glaucoma and other disorders of the eye. Is the patient up to date with their annual eye exam?  No  Who is the provider or what is the name of the office in which the patient attends annual eye exams? N/a If pt is not established with a provider, would they like to be referred to a provider to establish care? No .   Dental Screening: Recommended annual dental exams for proper oral hygiene  Community Resource Referral / Chronic Care Management: CRR required this visit?  No   CCM required this visit?  No      Plan:     I have personally reviewed and noted the following in the patient's chart:   Medical and social history Use of alcohol, tobacco or illicit drugs  Current medications and supplements including opioid prescriptions. Patient is not currently taking opioid prescriptions. Functional ability and status Nutritional status Physical activity Advanced directives List of other physicians Hospitalizations, surgeries, and ER visits in previous 12 months Vitals Screenings to include cognitive, depression, and falls Referrals and appointments  In addition, I have reviewed and discussed with patient  certain preventive protocols, quality metrics, and best practice recommendations. A written personalized care plan for preventive services as well as general preventive health recommendations were provided to patient.     Sandrea Hammond, LPN   02/01/7208   Nurse Notes: None

## 2021-01-31 NOTE — Patient Instructions (Signed)
Justin Gibbs , Thank you for taking time to come for your Medicare Wellness Visit. I appreciate your ongoing commitment to your health goals. Please review the following plan we discussed and let me know if I can assist you in the future.   Screening recommendations/referrals: Colonoscopy: No longer required  Recommended yearly ophthalmology/optometry visit for glaucoma screening and checkup Recommended yearly dental visit for hygiene and checkup  Vaccinations: Influenza vaccine: Done 06/08/2020 - Repeat annually  Pneumococcal vaccine: Done 07/05/2019 & 08/10/2020 Tdap vaccine: Due (every 10 years) Shingles vaccine: Shingrix discussed. Please contact your pharmacy for coverage information.     Covid-19: Done 11/11/19 & 12/03/19  Advanced directives: Please bring a copy of your health care power of attorney and living will to the office to be added to your chart at your convenience.   Conditions/risks identified: Aim for 30 minutes of exercise or brisk walking each day, drink 6-8 glasses of water and eat lots of fruits and vegetables.   Next appointment: Follow up in one year for your annual wellness visit.   Preventive Care 29 Years and Older, Male  Preventive care refers to lifestyle choices and visits with your health care provider that can promote health and wellness. What does preventive care include? A yearly physical exam. This is also called an annual well check. Dental exams once or twice a year. Routine eye exams. Ask your health care provider how often you should have your eyes checked. Personal lifestyle choices, including: Daily care of your teeth and gums. Regular physical activity. Eating a healthy diet. Avoiding tobacco and drug use. Limiting alcohol use. Practicing safe sex. Taking low doses of aspirin every day. Taking vitamin and mineral supplements as recommended by your health care provider. What happens during an annual well check? The services and screenings done  by your health care provider during your annual well check will depend on your age, overall health, lifestyle risk factors, and family history of disease. Counseling  Your health care provider may ask you questions about your: Alcohol use. Tobacco use. Drug use. Emotional well-being. Home and relationship well-being. Sexual activity. Eating habits. History of falls. Memory and ability to understand (cognition). Work and work Statistician. Screening  You may have the following tests or measurements: Height, weight, and BMI. Blood pressure. Lipid and cholesterol levels. These may be checked every 5 years, or more frequently if you are over 21 years old. Skin check. Lung cancer screening. You may have this screening every year starting at age 56 if you have a 30-pack-year history of smoking and currently smoke or have quit within the past 15 years. Fecal occult blood test (FOBT) of the stool. You may have this test every year starting at age 63. Flexible sigmoidoscopy or colonoscopy. You may have a sigmoidoscopy every 5 years or a colonoscopy every 10 years starting at age 16. Prostate cancer screening. Recommendations will vary depending on your family history and other risks. Hepatitis C blood test. Hepatitis B blood test. Sexually transmitted disease (STD) testing. Diabetes screening. This is done by checking your blood sugar (glucose) after you have not eaten for a while (fasting). You may have this done every 1-3 years. Abdominal aortic aneurysm (AAA) screening. You may need this if you are a current or former smoker. Osteoporosis. You may be screened starting at age 68 if you are at high risk. Talk with your health care provider about your test results, treatment options, and if necessary, the need for more tests. Vaccines  Your  health care provider may recommend certain vaccines, such as: Influenza vaccine. This is recommended every year. Tetanus, diphtheria, and acellular  pertussis (Tdap, Td) vaccine. You may need a Td booster every 10 years. Zoster vaccine. You may need this after age 40. Pneumococcal 13-valent conjugate (PCV13) vaccine. One dose is recommended after age 72. Pneumococcal polysaccharide (PPSV23) vaccine. One dose is recommended after age 67. Talk to your health care provider about which screenings and vaccines you need and how often you need them. This information is not intended to replace advice given to you by your health care provider. Make sure you discuss any questions you have with your health care provider. Document Released: 08/11/2015 Document Revised: 04/03/2016 Document Reviewed: 05/16/2015 Elsevier Interactive Patient Education  2017 [place] Prevention in the Home Falls can cause injuries. They can happen to people of all ages. There are many things you can do to make your home safe and to help prevent falls. What can I do on the outside of my home? Regularly fix the edges of walkways and driveways and fix any cracks. Remove anything that might make you trip as you walk through a door, such as a raised step or threshold. Trim any bushes or trees on the path to your home. Use bright outdoor lighting. Clear any walking paths of anything that might make someone trip, such as rocks or tools. Regularly check to see if handrails are loose or broken. Make sure that both sides of any steps have handrails. Any raised decks and porches should have guardrails on the edges. Have any leaves, snow, or ice cleared regularly. Use sand or salt on walking paths during winter. Clean up any spills in your garage right away. This includes oil or grease spills. What can I do in the bathroom? Use night lights. Install grab bars by the toilet and in the tub and shower. Do not use towel bars as grab bars. Use non-skid mats or decals in the tub or shower. If you need to sit down in the shower, use a plastic, non-slip stool. Keep the floor  dry. Clean up any water that spills on the floor as soon as it happens. Remove soap buildup in the tub or shower regularly. Attach bath mats securely with double-sided non-slip rug tape. Do not have throw rugs and other things on the floor that can make you trip. What can I do in the bedroom? Use night lights. Make sure that you have a light by your bed that is easy to reach. Do not use any sheets or blankets that are too big for your bed. They should not hang down onto the floor. Have a firm chair that has side arms. You can use this for support while you get dressed. Do not have throw rugs and other things on the floor that can make you trip. What can I do in the kitchen? Clean up any spills right away. Avoid walking on wet floors. Keep items that you use a lot in easy-to-reach places. If you need to reach something above you, use a strong step stool that has a grab bar. Keep electrical cords out of the way. Do not use floor polish or wax that makes floors slippery. If you must use wax, use non-skid floor wax. Do not have throw rugs and other things on the floor that can make you trip. What can I do with my stairs? Do not leave any items on the stairs. Make sure that there are handrails  on both sides of the stairs and use them. Fix handrails that are broken or loose. Make sure that handrails are as long as the stairways. Check any carpeting to make sure that it is firmly attached to the stairs. Fix any carpet that is loose or worn. Avoid having throw rugs at the top or bottom of the stairs. If you do have throw rugs, attach them to the floor with carpet tape. Make sure that you have a light switch at the top of the stairs and the bottom of the stairs. If you do not have them, ask someone to add them for you. What else can I do to help prevent falls? Wear shoes that: Do not have high heels. Have rubber bottoms. Are comfortable and fit you well. Are closed at the toe. Do not wear  sandals. If you use a stepladder: Make sure that it is fully opened. Do not climb a closed stepladder. Make sure that both sides of the stepladder are locked into place. Ask someone to hold it for you, if possible. Clearly mark and make sure that you can see: Any grab bars or handrails. First and last steps. Where the edge of each step is. Use tools that help you move around (mobility aids) if they are needed. These include: Canes. Walkers. Scooters. Crutches. Turn on the lights when you go into a dark area. Replace any light bulbs as soon as they burn out. Set up your furniture so you have a clear path. Avoid moving your furniture around. If any of your floors are uneven, fix them. If there are any pets around you, be aware of where they are. Review your medicines with your doctor. Some medicines can make you feel dizzy. This can increase your chance of falling. Ask your doctor what other things that you can do to help prevent falls. This information is not intended to replace advice given to you by your health care provider. Make sure you discuss any questions you have with your health care provider. Document Released: 05/11/2009 Document Revised: 12/21/2015 Document Reviewed: 08/19/2014 Elsevier Interactive Patient Education  2017 [employer].

## 2021-02-01 DIAGNOSIS — C4402 Squamous cell carcinoma of skin of lip: Secondary | ICD-10-CM | POA: Diagnosis not present

## 2021-02-12 ENCOUNTER — Encounter: Payer: Self-pay | Admitting: Nurse Practitioner

## 2021-02-12 ENCOUNTER — Ambulatory Visit (INDEPENDENT_AMBULATORY_CARE_PROVIDER_SITE_OTHER): Payer: Medicare Other | Admitting: Nurse Practitioner

## 2021-02-12 ENCOUNTER — Other Ambulatory Visit: Payer: Self-pay

## 2021-02-12 VITALS — BP 164/89 | HR 54 | Temp 97.4°F | Resp 20 | Ht 69.0 in | Wt 224.0 lb

## 2021-02-12 DIAGNOSIS — Z6832 Body mass index (BMI) 32.0-32.9, adult: Secondary | ICD-10-CM

## 2021-02-12 DIAGNOSIS — R972 Elevated prostate specific antigen [PSA]: Secondary | ICD-10-CM

## 2021-02-12 DIAGNOSIS — F411 Generalized anxiety disorder: Secondary | ICD-10-CM

## 2021-02-12 DIAGNOSIS — C61 Malignant neoplasm of prostate: Secondary | ICD-10-CM

## 2021-02-12 DIAGNOSIS — I480 Paroxysmal atrial fibrillation: Secondary | ICD-10-CM

## 2021-02-12 DIAGNOSIS — I10 Essential (primary) hypertension: Secondary | ICD-10-CM

## 2021-02-12 DIAGNOSIS — E782 Mixed hyperlipidemia: Secondary | ICD-10-CM

## 2021-02-12 MED ORDER — HYDROXYUREA 500 MG PO CAPS: 500.0000 mg | ORAL_CAPSULE | Freq: Two times a day (BID) | ORAL | 1 refills | Status: DC

## 2021-02-12 MED ORDER — METOPROLOL TARTRATE 25 MG PO TABS: 25.0000 mg | ORAL_TABLET | Freq: Two times a day (BID) | ORAL | 1 refills | Status: DC

## 2021-02-12 MED ORDER — APIXABAN 5 MG PO TABS: 5.0000 mg | ORAL_TABLET | Freq: Two times a day (BID) | ORAL | 11 refills | Status: DC

## 2021-02-12 MED ORDER — LISINOPRIL-HYDROCHLOROTHIAZIDE 20-12.5 MG PO TABS: 2.0000 | ORAL_TABLET | Freq: Every day | ORAL | 1 refills | Status: DC

## 2021-02-12 MED ORDER — PAROXETINE HCL 10 MG PO TABS: 5.0000 mg | ORAL_TABLET | Freq: Two times a day (BID) | ORAL | 1 refills | Status: DC

## 2021-02-12 NOTE — Patient Instructions (Signed)
https://www.nhlbi.nih.gov/files/docs/public/heart/dash_brief.pdf">  DASH Eating Plan DASH stands for Dietary Approaches to Stop Hypertension. The DASH eating plan is a healthy eating plan that has been shown to: Reduce high blood pressure (hypertension). Reduce your risk for type 2 diabetes, heart disease, and stroke. Help with weight loss. What are tips for following this plan? Reading food labels Check food labels for the amount of salt (sodium) per serving. Choose foods with less than 5 percent of the Daily Value of sodium. Generally, foods with less than 300 milligrams (mg) of sodium per serving fit into this eating plan. To find whole grains, look for the word "whole" as the first word in the ingredient list. Shopping Buy products labeled as "low-sodium" or "no salt added." Buy fresh foods. Avoid canned foods and pre-made or frozen meals. Cooking Avoid adding salt when cooking. Use salt-free seasonings or herbs instead of table salt or sea salt. Check with your health care provider or pharmacist before using salt substitutes. Do not fry foods. Cook foods using healthy methods such as baking, boiling, grilling, roasting, and broiling instead. Cook with heart-healthy oils, such as olive, canola, avocado, soybean, or sunflower oil. Meal planning  Eat a balanced diet that includes: 4 or more servings of fruits and 4 or more servings of vegetables each day. Try to fill one-half of your plate with fruits and vegetables. 6-8 servings of whole grains each day. Less than 6 oz (170 g) of lean meat, poultry, or fish each day. A 3-oz (85-g) serving of meat is about the same size as a deck of cards. One egg equals 1 oz (28 g). 2-3 servings of low-fat dairy each day. One serving is 1 cup (237 mL). 1 serving of nuts, seeds, or beans 5 times each week. 2-3 servings of heart-healthy fats. Healthy fats called omega-3 fatty acids are found in foods such as walnuts, flaxseeds, fortified milks, and eggs.  These fats are also found in cold-water fish, such as sardines, salmon, and mackerel. Limit how much you eat of: Canned or prepackaged foods. Food that is high in trans fat, such as some fried foods. Food that is high in saturated fat, such as fatty meat. Desserts and other sweets, sugary drinks, and other foods with added sugar. Full-fat dairy products. Do not salt foods before eating. Do not eat more than 4 egg yolks a week. Try to eat at least 2 vegetarian meals a week. Eat more home-cooked food and less restaurant, buffet, and fast food.  Lifestyle When eating at a restaurant, ask that your food be prepared with less salt or no salt, if possible. If you drink alcohol: Limit how much you use to: 0-1 drink a day for women who are not pregnant. 0-2 drinks a day for men. Be aware of how much alcohol is in your drink. In the [place] one drink equals one 12 oz bottle of beer (355 mL), one 5 oz glass of wine (148 mL), or one 1 oz glass of hard liquor (44 mL). General information Avoid eating more than 2,300 mg of salt a day. If you have hypertension, you may need to reduce your sodium intake to 1,500 mg a day. Work with your health care provider to maintain a healthy body weight or to lose weight. Ask what an ideal weight is for you. Get at least 30 minutes of exercise that causes your heart to beat faster (aerobic exercise) most days of the week. Activities may include walking, swimming, or biking. Work with your health care provider   or dietitian to adjust your eating plan to your individual calorie needs. What foods should I eat? Fruits All fresh, dried, or frozen fruit. Canned fruit in natural juice (without addedsugar). Vegetables Fresh or frozen vegetables (raw, steamed, roasted, or grilled). Low-sodium or reduced-sodium tomato and vegetable juice. Low-sodium or reduced-sodium tomatosauce and tomato paste. Low-sodium or reduced-sodium canned vegetables. Grains Whole-grain or  whole-wheat bread. Whole-grain or whole-wheat pasta. Brown rice. Oatmeal. Quinoa. Bulgur. Whole-grain and low-sodium cereals. Pita bread.Low-fat, low-sodium crackers. Whole-wheat flour tortillas. Meats and other proteins Skinless chicken or turkey. Ground chicken or turkey. Pork with fat trimmed off. Fish and seafood. Egg whites. Dried beans, peas, or lentils. Unsalted nuts, nut butters, and seeds. Unsalted canned beans. Lean cuts of beef with fat trimmed off. Low-sodium, lean precooked or cured meat, such as sausages or meatloaves. Dairy Low-fat (1%) or fat-free (skim) milk. Reduced-fat, low-fat, or fat-free cheeses. Nonfat, low-sodium ricotta or cottage cheese. Low-fat or nonfatyogurt. Low-fat, low-sodium cheese. Fats and oils Soft margarine without trans fats. Vegetable oil. Reduced-fat, low-fat, or light mayonnaise and salad dressings (reduced-sodium). Canola, safflower, olive, avocado, soybean, andsunflower oils. Avocado. Seasonings and condiments Herbs. Spices. Seasoning mixes without salt. Other foods Unsalted popcorn and pretzels. Fat-free sweets. The items listed above may not be a complete list of foods and beverages you can eat. Contact a dietitian for more information. What foods should I avoid? Fruits Canned fruit in a light or heavy syrup. Fried fruit. Fruit in cream or buttersauce. Vegetables Creamed or fried vegetables. Vegetables in a cheese sauce. Regular canned vegetables (not low-sodium or reduced-sodium). Regular canned tomato sauce and paste (not low-sodium or reduced-sodium). Regular tomato and vegetable juice(not low-sodium or reduced-sodium). Pickles. Olives. Grains Baked goods made with fat, such as croissants, muffins, or some breads. Drypasta or rice meal packs. Meats and other proteins Fatty cuts of meat. Ribs. Fried meat. Bacon. Bologna, salami, and other precooked or cured meats, such as sausages or meat loaves. Fat from the back of a pig (fatback). Bratwurst.  Salted nuts and seeds. Canned beans with added salt. Canned orsmoked fish. Whole eggs or egg yolks. Chicken or turkey with skin. Dairy Whole or 2% milk, cream, and half-and-half. Whole or full-fat cream cheese. Whole-fat or sweetened yogurt. Full-fat cheese. Nondairy creamers. Whippedtoppings. Processed cheese and cheese spreads. Fats and oils Butter. Stick margarine. Lard. Shortening. Ghee. Bacon fat. Tropical oils, suchas coconut, palm kernel, or palm oil. Seasonings and condiments Onion salt, garlic salt, seasoned salt, table salt, and sea salt. Worcestershire sauce. Tartar sauce. Barbecue sauce. Teriyaki sauce. Soy sauce, including reduced-sodium. Steak sauce. Canned and packaged gravies. Fish sauce. Oyster sauce. Cocktail sauce. Store-bought horseradish. Ketchup. Mustard. Meat flavorings and tenderizers. Bouillon cubes. Hot sauces. Pre-made or packaged marinades. Pre-made or packaged taco seasonings. Relishes. Regular saladdressings. Other foods Salted popcorn and pretzels. The items listed above may not be a complete list of foods and beverages you should avoid. Contact a dietitian for more information. Where to find more information National Heart, Lung, and Blood Institute: www.nhlbi.nih.gov American Heart Association: www.heart.org Academy of Nutrition and Dietetics: www.eatright.org National Kidney Foundation: www.kidney.org Summary The DASH eating plan is a healthy eating plan that has been shown to reduce high blood pressure (hypertension). It may also reduce your risk for type 2 diabetes, heart disease, and stroke. When on the DASH eating plan, aim to eat more fresh fruits and vegetables, whole grains, lean proteins, low-fat dairy, and heart-healthy fats. With the DASH eating plan, you should limit salt (sodium) intake to 2,300   mg a day. If you have hypertension, you may need to reduce your sodium intake to 1,500 mg a day. Work with your health care provider or dietitian to adjust  your eating plan to your individual calorie needs. This information is not intended to replace advice given to you by your health care provider. Make sure you discuss any questions you have with your healthcare provider. Document Revised: 06/18/2019 Document Reviewed: 06/18/2019 Elsevier Patient Education  2022 Elsevier Inc.  

## 2021-02-12 NOTE — Progress Notes (Addendum)
Subjective:    Patient ID: Justin Gibbs, male    DOB: 02-19-1941, 80 y.o.   MRN: [id number]  Chief Complaint: Medical Management of Chronic Issues    HPI:  1. Essential hypertension No c/o chest pain, sob or headaches. He occasionally checks his blood pressure at home. Usually 130/80's BP Readings from Last 3 Encounters:  02/12/21 (!) 164/89  12/27/20 (!) 159/89  11/28/20 (!) 164/84     2. Paroxysmal atrial fibrillation (HCC) Is on eliquis with no issues. Denies palpitations or heart racing  3. Mixed hyperlipidemia Does not watch diet and does no exercise  4. GAD (generalized anxiety disorder) Has been on paxil for several years. Is doing well. GAD 7 : Generalized Anxiety Score 02/12/2021 08/10/2020 02/08/2020  Nervous, Anxious, on Edge 0 0 0  Control/stop worrying 0 0 0  Worry too much - different things 0 0 0  Trouble relaxing 0 0 0  Restless 0 0 0  Easily annoyed or irritable 0 0 0  Afraid - awful might happen 0 0 0  Total GAD 7 Score 0 0 0  Anxiety Difficulty Not difficult at all Not difficult at all Not difficult at all      5. Elevated prostate specific antigen (PSA) No PSA result sin computer   6. Prostate cancer [facility]) Just had  radiation treatment. Is voiding well. Just has to void often  7. BMI 32.0-32.9,adult No recent weight changes Wt Readings from Last 3 Encounters:  02/12/21 224 lb (101.6 kg)  01/31/21 225 lb (102.1 kg)  12/27/20 225 lb 3.2 oz (102.2 kg)   BMI Readings from Last 3 Encounters:  02/12/21 33.08 kg/m  01/31/21 33.23 kg/m  12/27/20 33.26 kg/m       Outpatient Encounter Medications as of 02/12/2021  Medication Sig   apixaban (ELIQUIS) 5 MG TABS tablet Take 1 tablet (5 mg total) by mouth 2 (two) times daily.   hydroxyurea (HYDREA) 500 MG capsule Take 1 capsule (500 mg total) by mouth 2 (two) times daily. May take with food to minimize GI side effects.   lisinopril-hydrochlorothiazide (ZESTORETIC) 20-12.5 MG tablet Take 2  tablets by mouth daily.   metoprolol tartrate (LOPRESSOR) 25 MG tablet Take 1 tablet (25 mg total) by mouth 2 (two) times daily.   Multiple Vitamin (MULTIVITAMIN) capsule Take 1 capsule by mouth daily.   PARoxetine (PAXIL) 10 MG tablet Take 0.5 tablets (5 mg total) by mouth 2 (two) times daily.   No facility-administered encounter medications on file as of 02/12/2021.    Past Surgical History:  Procedure Laterality Date   HERNIA REPAIR Bilateral    x 2    PROSTATECTOMY      Family History  Problem Relation Age of Onset   Alzheimer's disease Mother    Alcohol abuse Father    Cancer Father        leukemia   Cirrhosis Father    Stomach cancer Father    Stroke Brother    Diabetes Daughter    Cancer Maternal Grandmother    Cancer Paternal Grandfather    Prostate cancer Brother    Brain cancer Brother    Thyroid cancer Sister     New complaints: None today  Social history: Lives with his wife  Controlled substance contract: n/a     Review of Systems  Constitutional:  Negative for diaphoresis.  Eyes:  Negative for pain.  Respiratory:  Negative for shortness of breath.   Cardiovascular:  Negative for chest pain, palpitations  and leg swelling.  Gastrointestinal:  Negative for abdominal pain.  Endocrine: Negative for polydipsia.  Skin:  Negative for rash.  Neurological:  Negative for dizziness, weakness and headaches.  Hematological:  Does not bruise/bleed easily.  All other systems reviewed and are negative.     Objective:   Physical Exam Vitals and nursing note reviewed.  Constitutional:      Appearance: Normal appearance. He is well-developed.  HENT:     Head: Normocephalic.     Nose: Nose normal.  Eyes:     Pupils: Pupils are equal, round, and reactive to light.  Neck:     Thyroid: No thyroid mass or thyromegaly.     Vascular: No carotid bruit or JVD.     Trachea: Phonation normal.  Cardiovascular:     Rate and Rhythm: Normal rate and regular rhythm.   Pulmonary:     Effort: Pulmonary effort is normal. No respiratory distress.     Breath sounds: Normal breath sounds.  Abdominal:     General: Bowel sounds are normal.     Palpations: Abdomen is soft.     Tenderness: There is no abdominal tenderness.  Musculoskeletal:        General: Normal range of motion.     Cervical back: Normal range of motion and neck supple.  Lymphadenopathy:     Cervical: No cervical adenopathy.  Skin:    General: Skin is warm and dry.     Comments: Iip excison area is doing well- no signs of infection.  Neurological:     Mental Status: He is alert and oriented to person, place, and time.  Psychiatric:        Behavior: Behavior normal.        Thought Content: Thought content normal.        Judgment: Judgment normal.    BP (!) 164/89   Pulse (!) 54   Temp (!) 97.4 F (36.3 C) (Temporal)   Resp 20   Ht _0  (1.753 m)   Wt 224 lb (101.6 kg)   BMI 33.08 kg/m         Assessment & Plan:  Justin Gibbs comes in today with chief complaint of Medical Management of Chronic Issues   Diagnosis and orders addressed:  1. Essential hypertension Keep diary of blood pressure at home - hydroxyurea (HYDREA) 500 MG capsule; Take 1 capsule (500 mg total) by mouth 2 (two) times daily. May take with food to minimize GI side effects.  Dispense: 180 capsule; Refill: 1 - lisinopril-hydrochlorothiazide (ZESTORETIC) 20-12.5 MG tablet; Take 2 tablets by mouth daily.  Dispense: 90 tablet; Refill: 1 - CBC with Differential/Platelet - CMP14+EGFR  2. Paroxysmal atrial fibrillation (HCC) Avoid caffeine - metoprolol tartrate (LOPRESSOR) 25 MG tablet; Take 1 tablet (25 mg total) by mouth 2 (two) times daily.  Dispense: 90 tablet; Refill: 1 - apixaban (ELIQUIS) 5 MG TABS tablet; Take 1 tablet (5 mg total) by mouth 2 (two) times daily.  Dispense: 60 tablet; Refill: 11  3. Mixed hyperlipidemia Low fat diet - Lipid panel  4. GAD (generalized anxiety disorder) Stress  management - PARoxetine (PAXIL) 10 MG tablet; Take 0.5 tablets (5 mg total) by mouth 2 (two) times daily.  Dispense: 90 tablet; Refill: 1  5. Elevated prostate specific antigen (PSA) Keep follow up with urology and  oncology  6. Prostate cancer [facility]) Keep follow up with urology and oncology  7. BMI 32.0-32.9,adult Discussed diet and exercise for person with BMI >25 Will recheck  weight in 3-6 months   Labs pending Health Maintenance reviewed Diet and exercise encouraged  Follow up plan: 6 months   Mary-Margaret Hassell Done, FNP

## 2021-02-15 NOTE — Telephone Encounter (Signed)
Note changed to had radiation treatments- the amount was taken out.

## 2021-02-21 ENCOUNTER — Inpatient Hospital Stay: Payer: Medicare Other

## 2021-02-21 ENCOUNTER — Inpatient Hospital Stay: Payer: Medicare Other | Attending: Hematology and Oncology | Admitting: Hematology and Oncology

## 2021-02-21 ENCOUNTER — Other Ambulatory Visit: Payer: Self-pay | Admitting: *Deleted

## 2021-02-21 ENCOUNTER — Other Ambulatory Visit: Payer: Self-pay

## 2021-02-21 VITALS — BP 151/105 | HR 74 | Temp 97.5°F | Resp 18 | Wt 224.7 lb

## 2021-02-21 DIAGNOSIS — D45 Polycythemia vera: Secondary | ICD-10-CM

## 2021-02-21 DIAGNOSIS — Z79899 Other long term (current) drug therapy: Secondary | ICD-10-CM | POA: Insufficient documentation

## 2021-02-21 DIAGNOSIS — C61 Malignant neoplasm of prostate: Secondary | ICD-10-CM | POA: Diagnosis not present

## 2021-02-21 DIAGNOSIS — Z8546 Personal history of malignant neoplasm of prostate: Secondary | ICD-10-CM | POA: Insufficient documentation

## 2021-02-21 DIAGNOSIS — Z87891 Personal history of nicotine dependence: Secondary | ICD-10-CM | POA: Diagnosis not present

## 2021-02-21 DIAGNOSIS — Z7901 Long term (current) use of anticoagulants: Secondary | ICD-10-CM | POA: Insufficient documentation

## 2021-02-21 LAB — CBC WITH DIFFERENTIAL (CANCER CENTER ONLY)
Abs Immature Granulocytes: 0.03 10*3/uL (ref 0.00–0.07)
Basophils Absolute: 0.1 10*3/uL (ref 0.0–0.1)
Basophils Relative: 1 %
Eosinophils Absolute: 0.1 10*3/uL (ref 0.0–0.5)
Eosinophils Relative: 2 %
HCT: 42.8 % (ref 39.0–52.0)
Hemoglobin: 15.1 g/dL (ref 13.0–17.0)
Immature Granulocytes: 1 %
Lymphocytes Relative: 12 %
Lymphs Abs: 0.7 10*3/uL (ref 0.7–4.0)
MCH: 37.3 pg — ABNORMAL HIGH (ref 26.0–34.0)
MCHC: 35.3 g/dL (ref 30.0–36.0)
MCV: 105.7 fL — ABNORMAL HIGH (ref 80.0–100.0)
Monocytes Absolute: 0.5 10*3/uL (ref 0.1–1.0)
Monocytes Relative: 8 %
Neutro Abs: 4.5 10*3/uL (ref 1.7–7.7)
Neutrophils Relative %: 76 %
Platelet Count: 205 10*3/uL (ref 150–400)
RBC: 4.05 MIL/uL — ABNORMAL LOW (ref 4.22–5.81)
RDW: 13.9 % (ref 11.5–15.5)
WBC Count: 5.8 10*3/uL (ref 4.0–10.5)
nRBC: 0 % (ref 0.0–0.2)

## 2021-02-21 LAB — CMP (CANCER CENTER ONLY)
ALT: 17 U/L (ref 0–44)
AST: 20 U/L (ref 15–41)
Albumin: 3.8 g/dL (ref 3.5–5.0)
Alkaline Phosphatase: 44 U/L (ref 38–126)
Anion gap: 9 (ref 5–15)
BUN: 29 mg/dL — ABNORMAL HIGH (ref 8–23)
CO2: 30 mmol/L (ref 22–32)
Calcium: 9.6 mg/dL (ref 8.9–10.3)
Chloride: 102 mmol/L (ref 98–111)
Creatinine: 1.32 mg/dL — ABNORMAL HIGH (ref 0.61–1.24)
GFR, Estimated: 55 mL/min — ABNORMAL LOW (ref >60)
Glucose, Bld: 82 mg/dL (ref 70–99)
Potassium: 4.1 mmol/L (ref 3.5–5.1)
Sodium: 141 mmol/L (ref 135–145)
Total Bilirubin: 0.7 mg/dL (ref 0.3–1.2)
Total Protein: 7.1 g/dL (ref 6.5–8.1)

## 2021-02-21 LAB — FERRITIN: Ferritin: 121 ng/mL (ref 24–336)

## 2021-02-21 NOTE — Progress Notes (Signed)
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 Lorenso Courier  10/11/2020: started phebotomy q 2 weeks and hydroxyurea 538m BID 11/15/2020: WBC 9.0, Hgb 14.9, HCT 44.5%, Plt 384. Holding phlebotomies, patient at target.  02/21/2021: WBC 5.8, Hgb 15.1, Hct 42.8, Plt 208  #Hx of Prostate Cancer 2006: robotic resection of prostate.  11/16/2019: PSA 0.21([facility])  08/08/2020: PSA 0.36 ([facility]   Interval History:  Justin Laser89y.o. male with medical history significant for polycythemia vera who presents for a follow up visit. The patient's last visit was on 12/27/2020. In the interim since the last visit the patient has reached and stayed at his goal hematocrit of less than 45% hydroxyurea 500 mg twice daily.  On exam today Justin Gibbs reports he feels quite well.  He continues to increase his stamina.  He notes that he does have "a little diarrhea from the radiation".  This radiation therapy wrapped up on July 6.  He notes that he is well satisfied with his treatment there and is optimistic moving forward.  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  He is recently been contacted by lawyers because he spent [phone] on building and the listed clubhouse at [employer]  He notes he has not had any weakness, numbness, chest pain, shortness of breath, or lower extremity swelling.  A full 10 point ROS is listed below.   MEDICAL HISTORY:  Past Medical History:  Diagnosis Date   Anxiety     Arthritis    [place]    [place]state / removed 2006   Depression    High triglycerides    Hypertension    Myocardial infarction ([facility]     SURGICAL HISTORY: Past Surgical History:  Procedure Laterality Date   HERNIA REPAIR Bilateral    x 2    PROSTATECTOMY      SOCIAL HISTORY: Social History   Socioeconomic History   Marital status: Married    Spouse name: Dawn   Number of children: 3   Years of education: Not on file   Highest education level: Not on file  Occupational History   Occupation: retired     Comment: 2007  Tobacco Use   Smoking status: Former    Packs/day: 1.00    Years: 23.00    Pack years: 23.00    Types: Cigarettes, Cigars    Quit date: 06/12/1982    Years since quitting: 38.7   Smokeless tobacco: Never   Tobacco comments:    I was a light smoker  Vaping Use   Vaping Use: Never used  Substance and Sexual Activity   Alcohol use: Never   Drug use: Never   Sexual activity: Not Currently    Comment: Prostate removal  Nov. 15 2006  Other Topics Concern   Not on file  Social History Narrative   Lives with wife and son. He has a basement with a wood stove.   Social Determinants of Health   Financial Resource Strain: Low Risk    Difficulty of Paying Living Expenses:  Not hard at all  Food Insecurity: No Food Insecurity   Worried About Charity fundraiser in the Last Year: Never true   Ran Out of Food in the Last Year: Never true  Transportation Needs: No Transportation Needs   Lack of Transportation (Medical): No   Lack of Transportation (Non-Medical): No  Physical Activity: Sufficiently Active   Days of Exercise per Week: 7 days   Minutes of Exercise per Session: 30 min  Stress: No Stress Concern Present   Feeling of Stress : Only a little  Social Connections: Moderately Integrated   Frequency of Communication with Friends and Family: More than three times a week   Frequency of Social Gatherings with Friends and Family: More than three times  a week   Attends Religious Services: More than 4 times per year   Active Member of [employer] or Organizations: No   Attends Music therapist: Never   Marital Status: Married  Human resources officer Violence: Not At Risk   Fear of Current or Ex-Partner: No   Emotionally Abused: No   Physically Abused: No   Sexually Abused: No    FAMILY HISTORY: Family History  Problem Relation Age of Onset   Alzheimer's disease Mother    Alcohol abuse Father    Cancer Father        leukemia   Cirrhosis Father    Stomach cancer Father    Stroke Brother    Diabetes Daughter    Cancer Maternal Grandmother    Cancer Paternal Grandfather    Prostate cancer Brother    Brain cancer Brother    Thyroid cancer Sister     ALLERGIES:  has No Known Allergies.  MEDICATIONS:  Current Outpatient Medications  Medication Sig Dispense Refill   apixaban (ELIQUIS) 5 MG TABS tablet Take 1 tablet (5 mg total) by mouth 2 (two) times daily. 60 tablet 11   hydroxyurea (HYDREA) 500 MG capsule Take 1 capsule (500 mg total) by mouth 2 (two) times daily. May take with food to minimize GI side effects. 180 capsule 1   lisinopril-hydrochlorothiazide (ZESTORETIC) 20-12.5 MG tablet Take 2 tablets by mouth daily. 180 tablet 1   metoprolol tartrate (LOPRESSOR) 25 MG tablet Take 1 tablet (25 mg total) by mouth 2 (two) times daily. 90 tablet 1   Multiple Vitamin (MULTIVITAMIN) capsule Take 1 capsule by mouth daily.     PARoxetine (PAXIL) 10 MG tablet Take 0.5 tablets (5 mg total) by mouth 2 (two) times daily. 90 tablet 1   No current facility-administered medications for this visit.    REVIEW OF SYSTEMS:   Constitutional: ( - ) fevers, ( - )  chills , ( - ) night sweats Eyes: ( - ) blurriness of vision, ( - ) double vision, ( - ) watery eyes Ears, nose, mouth, throat, and face: ( - ) mucositis, ( - ) sore throat Respiratory: ( - ) cough, ( - ) dyspnea, ( - ) wheezes Cardiovascular: ( - ) palpitation, ( - ) chest  discomfort, ( - ) lower extremity swelling Gastrointestinal:  ( - ) nausea, ( - ) heartburn, ( - ) change in bowel habits Skin: ( - ) abnormal skin rashes Lymphatics: ( - ) new lymphadenopathy, ( - ) easy bruising Neurological: ( - ) numbness, ( - ) tingling, ( - ) new weaknesses Behavioral/Psych: ( - ) mood change, ( - ) new changes  All other systems were reviewed with the patient and are negative.  PHYSICAL  EXAMINATION:  Vitals:   02/21/21 0940  BP: (!) 151/105  Pulse: 74  Resp: 18  Temp: (!) 97.5 F (36.4 C)  SpO2: 99%   Filed Weights   02/21/21 0940  Weight: 224 lb 11.2 oz (101.9 kg)    GENERAL: well appearing elderly Caucasian male alert, no distress and comfortable SKIN: skin color, texture, turgor are normal, no rashes or significant lesions EYES: conjunctiva are pink and non-injected, sclera clear LUNGS: clear to auscultation and percussion with normal breathing effort HEART: regular rate & rhythm and no murmurs and no lower extremity edema Musculoskeletal: no cyanosis of digits and no clubbing  PSYCH: alert & oriented x 3, fluent speech NEURO: no focal motor/sensory deficits  LABORATORY DATA:  I have reviewed the data as listed CBC Latest Ref Rng & Units 02/21/2021 01/24/2021 12/27/2020  WBC 4.0 - 10.5 K/uL 5.8 6.1 6.5  Hemoglobin 13.0 - 17.0 g/dL 15.1 15.4 14.9  Hematocrit 39.0 - 52.0 % 42.8 43.6 42.8  Platelets 150 - 400 K/uL 205 200 240    CMP Latest Ref Rng & Units 01/24/2021 12/27/2020 12/13/2020  Glucose 70 - 99 mg/dL 92 81 81  BUN 8 - 23 mg/dL 32(H) 30(H) 25(H)  Creatinine 0.61 - 1.24 mg/dL 1.37(H) 1.17 1.12  Sodium 135 - 145 mmol/L 142 142 140  Potassium 3.5 - 5.1 mmol/L 4.0 4.5 4.2  Chloride 98 - 111 mmol/L 103 102 101  CO2 22 - 32 mmol/L 29 28 29   Calcium 8.9 - 10.3 mg/dL 9.3 9.7 9.3  Total Protein 6.5 - 8.1 g/dL 7.0 7.4 7.1  Total Bilirubin 0.3 - 1.2 mg/dL 0.7 0.7 0.8  Alkaline Phos 38 - 126 U/L 46 47 46  AST 15 - 41 U/L 21 20 24   ALT 0 - 44 U/L 19  17 15     No results found for: MPROTEIN No results found for: KPAFRELGTCHN, LAMBDASER, KAPLAMBRATIO  Pathology:    RADIOGRAPHIC STUDIES: No results found.  ASSESSMENT & PLAN Justin Gibbs 80 y.o. male with medical history significant for polycythemia vera who presents for a follow up visit.   After review of the labs, the records, and discussion with the patient the findings most consistent with a polycythemia vera which is JAK2 V617 F positive.  These results were confirmed with a bone marrow biopsy.  Given the patient's age greater than 56 he is considered a high risk polycythemia vera and therefore would require cytoreductive therapy with hydroxyurea 500 mg twice daily in addition to every 2 week phlebotomies.  Goal hematocrit for this patient is less than 45%.  Additionally he is on Eliquis therapy which will serve as anticoagulation for thromboprophylaxis.  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  Patient at goal HCT goal of <45% --patient currently at goal HCT, will hold further phlebotomies unless needed.  --holding ASA 76m PO daily at the patient is on eliquis.  --baseline iron labs from 02/21/2021 show ferritin of 121 --RTC in 8 weeks for next clinic visit to continue monitoring  #Prostate Cancer --2006: robotic resection of prostate.  -- Due to concern for biochemical progression the patient recently underwent radiation therapy.  He completed this on 01/31/2021. --Currently followed by radiation oncology and urology. --We can follow PSAs if requested by 1 of these other services.  No orders of the defined types were placed in this encounter.   All  questions were answered. The patient knows to call the clinic with any problems, questions or concerns.  A total of more than 30 minutes were spent on this  encounter and over half of that time was spent on counseling and coordination of care as outlined above.   Ledell Peoples, MD Department of Hematology/Oncology [place] at [facility] Phone: [phone] Pager: [phone] Email: [email or web address]  02/21/2021 10:03 AM

## 2021-03-15 ENCOUNTER — Ambulatory Visit: Payer: Medicare Other | Admitting: Urology

## 2021-03-19 ENCOUNTER — Encounter: Payer: Self-pay | Admitting: Urology

## 2021-03-19 NOTE — Progress Notes (Signed)
Patient reports doing well. Denies pain, dysuria, hematuria, constipation or skin changes. Expressed mild fatigue, nocturia x4, frequency, urgency, mild diarrhea, and a good urine stream.  I-PSS score of 9  Meaningful use questions complete and patient notified of 11:00am telephone appointment on 03/21/21 and expressed understanding.

## 2021-03-20 NOTE — Progress Notes (Signed)
  Radiation Oncology         [phone] ________________________________  Name: Justin Gibbs MRN: [id number]  Date: 01/31/2021  DOB: June 21, 1941  End of Treatment Note  Diagnosis:   80 y.o. 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     Indication for treatment:  Curative, Definitive Prostatic Fossa Radiotherapy       Radiation treatment dates:   12/07/20 - 01/31/21  Site/dose:   The prostatic fossa was treated to 68.4 Gy in 38 fractions of 1.8 Gy  Beams/energy:   The prostatic fossa was treated using VMAT intensity modulated radiotherapy delivering 6 megavolt photons. Image guidance was performed with CB-CT studies prior to each fraction. He was immobilized with a body fix lower extremity mold.  Narrative: The patient tolerated radiation treatment relatively well with only mild urinary symptoms and modest fatigue. He reported mild dysuria, increased frequency, urgency and nocturia x2. He also noted loose stools but denied outright diarrhea.  Plan: The patient has completed radiation treatment. He will return to radiation oncology clinic for routine followup in one month. I advised him to call or return sooner if he has any questions or concerns related to his recovery or treatment. ________________________________  Sheral Apley. Tammi Klippel, M.D.

## 2021-03-21 ENCOUNTER — Ambulatory Visit
Admission: RE | Admit: 2021-03-21 | Discharge: 2021-03-21 | Disposition: A | Discharge status: Home / Self Care | Payer: Medicare Other | Source: Ambulatory Visit | Attending: Urology | Admitting: Urology

## 2021-03-21 ENCOUNTER — Other Ambulatory Visit: Payer: Self-pay | Admitting: Urology

## 2021-03-21 DIAGNOSIS — C61 Malignant neoplasm of prostate: Secondary | ICD-10-CM

## 2021-03-21 DIAGNOSIS — Z191 Hormone sensitive malignancy status: Secondary | ICD-10-CM

## 2021-03-21 HISTORY — DX: Polycythemia vera: D45

## 2021-03-21 NOTE — Progress Notes (Signed)
Radiation Oncology         [phone] ________________________________  Name: Justin Gibbs MRN: [id number]  Date: 03/21/2021  DOB: 11-27-1940  Post Treatment Note  CC: Chevis Pretty, FNP  Myrlene Broker, MD  Diagnosis:   80 y.o. 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   Interval Since Last Radiation:  7 weeks  12/07/20 - 01/31/21:The prostatic fossa was treated to 68.4 Gy in 38 fractions of 1.8 Gy  Narrative:  I spoke with the patient to conduct his routine scheduled 1 month follow up visit via telephone to spare the patient unnecessary potential exposure in the healthcare setting during the current COVID-19 pandemic.  The patient was notified in advance and gave permission to proceed with this visit format.  He tolerated radiation treatment relatively well with only mild urinary symptoms and modest fatigue. He reported mild dysuria, increased frequency, urgency and nocturia x2. He also noted loose stools but denied outright diarrhea.                              On review of systems, the patient states that he is doing well in general.  He reports gradual improvement in the LUTS with a current IPSS score of 9, indicating mild to moderate urinary symptoms only.  He continues with mild increased frequency, urgency and nocturia 3-4 times per night but specifically denies dysuria, gross hematuria, straining to void, incomplete bladder emptying or incontinence.  He feels that he is very close to back to his baseline at this point.  He has had some intermittent loose stool/diarrhea but this is almost completely resolved at this point as well.  His energy is gradually improving and he is staying active.  He reports a healthy appetite and is maintaining his weight.  He denies abdominal pain, nausea, vomiting, diarrhea or constipation.  Overall, he is quite pleased with his progress to  date.  ALLERGIES:  has No Known Allergies.  Meds: Current Outpatient Medications  Medication Sig Dispense Refill   apixaban (ELIQUIS) 5 MG TABS tablet Take 1 tablet (5 mg total) by mouth 2 (two) times daily. 60 tablet 11   hydroxyurea (HYDREA) 500 MG capsule Take 1 capsule (500 mg total) by mouth 2 (two) times daily. May take with food to minimize GI side effects. 180 capsule 1   lisinopril-hydrochlorothiazide (ZESTORETIC) 20-12.5 MG tablet Take 2 tablets by mouth daily. 180 tablet 1   metoprolol tartrate (LOPRESSOR) 25 MG tablet Take 1 tablet (25 mg total) by mouth 2 (two) times daily. 90 tablet 1   Multiple Vitamin (MULTIVITAMIN) capsule Take 1 capsule by mouth daily.     PARoxetine (PAXIL) 10 MG tablet Take 0.5 tablets (5 mg total) by mouth 2 (two) times daily. 90 tablet 1   No current facility-administered medications for this encounter.    Physical Findings:  vitals were not taken for this visit.  Pain Assessment Pain Score: 0-No pain/10 Unable to assess due to telephone follow-up visit format.  Lab Findings: Lab Results  Component Value Date   WBC 5.8 02/21/2021   HGB 15.1 02/21/2021   HCT 42.8 02/21/2021   MCV 105.7 (H) 02/21/2021   PLT 205 02/21/2021     Radiographic Findings: No results found.  Impression/Plan: 1. 80 y.o. gentleman with a biochemical recurrence of his Stage pT3a, pN0, Gleason 3+4 prostate  cancer with a PSA at 0.5 s/p RALP in 05/2005.  He will continue to follow up with urology for ongoing PSA determinations and is planning to follow-up with Dr. Jeffie Pollock going forward.  Dr. Jeffie Pollock was originally his urologist and he only transferred care to Dr. Rosana Hoes because he elected to have a robotic prostatectomy for treatment of his prostate cancer.  At this point, he prefers to transfer his care back to Dr. Jeffie Pollock so I have advised that I will send a copy of my note today and that if he has not heard from one of Dr. Ralene Muskrat staff in the next week, he should call  [facility] to schedule a follow-up visit with him for repeat labs to check his initial posttreatment PSA in October 2022.  He understands what to expect with regards to PSA monitoring going forward. I will look forward to following his response to treatment via correspondence with urology, and would be happy to continue to participate in his care if clinically indicated. I talked to the patient about what to expect in the future, including his risk for erectile dysfunction and rectal bleeding. I encouraged him to call or return to the office if he has any questions regarding his previous radiation or possible radiation side effects. He was comfortable with this plan and will follow up as needed.    Nicholos Johns, MMS, PA-C [place] [facility] [place]: [phone]  Fax: [phone]

## 2021-04-19 ENCOUNTER — Other Ambulatory Visit: Payer: Self-pay | Admitting: Hematology and Oncology

## 2021-04-19 ENCOUNTER — Inpatient Hospital Stay: Payer: Medicare Other | Attending: Hematology and Oncology | Admitting: Hematology and Oncology

## 2021-04-19 ENCOUNTER — Other Ambulatory Visit: Payer: Self-pay

## 2021-04-19 ENCOUNTER — Inpatient Hospital Stay: Payer: Medicare Other

## 2021-04-19 VITALS — BP 145/105 | HR 69 | Temp 98.2°F | Resp 19 | Ht 69.0 in | Wt 227.1 lb

## 2021-04-19 DIAGNOSIS — I10 Essential (primary) hypertension: Secondary | ICD-10-CM | POA: Diagnosis not present

## 2021-04-19 DIAGNOSIS — Z8546 Personal history of malignant neoplasm of prostate: Secondary | ICD-10-CM | POA: Insufficient documentation

## 2021-04-19 DIAGNOSIS — D75839 Thrombocytosis, unspecified: Secondary | ICD-10-CM | POA: Diagnosis not present

## 2021-04-19 DIAGNOSIS — Z9079 Acquired absence of other genital organ(s): Secondary | ICD-10-CM | POA: Insufficient documentation

## 2021-04-19 DIAGNOSIS — C61 Malignant neoplasm of prostate: Secondary | ICD-10-CM | POA: Diagnosis not present

## 2021-04-19 DIAGNOSIS — Z923 Personal history of irradiation: Secondary | ICD-10-CM | POA: Diagnosis not present

## 2021-04-19 DIAGNOSIS — Z79899 Other long term (current) drug therapy: Secondary | ICD-10-CM | POA: Insufficient documentation

## 2021-04-19 DIAGNOSIS — Z87891 Personal history of nicotine dependence: Secondary | ICD-10-CM | POA: Diagnosis not present

## 2021-04-19 DIAGNOSIS — Z7901 Long term (current) use of anticoagulants: Secondary | ICD-10-CM | POA: Diagnosis not present

## 2021-04-19 DIAGNOSIS — D45 Polycythemia vera: Secondary | ICD-10-CM | POA: Diagnosis not present

## 2021-04-19 LAB — CBC WITH DIFFERENTIAL (CANCER CENTER ONLY)
Abs Immature Granulocytes: 0.03 10*3/uL (ref 0.00–0.07)
Basophils Absolute: 0.1 10*3/uL (ref 0.0–0.1)
Basophils Relative: 1 %
Eosinophils Absolute: 0.1 10*3/uL (ref 0.0–0.5)
Eosinophils Relative: 1 %
HCT: 39.4 % (ref 39.0–52.0)
Hemoglobin: 13.9 g/dL (ref 13.0–17.0)
Immature Granulocytes: 0 %
Lymphocytes Relative: 12 %
Lymphs Abs: 0.8 10*3/uL (ref 0.7–4.0)
MCH: 38.9 pg — ABNORMAL HIGH (ref 26.0–34.0)
MCHC: 35.3 g/dL (ref 30.0–36.0)
MCV: 110.4 fL — ABNORMAL HIGH (ref 80.0–100.0)
Monocytes Absolute: 0.5 10*3/uL (ref 0.1–1.0)
Monocytes Relative: 8 %
Neutro Abs: 5.2 10*3/uL (ref 1.7–7.7)
Neutrophils Relative %: 78 %
Platelet Count: 238 10*3/uL (ref 150–400)
RBC: 3.57 MIL/uL — ABNORMAL LOW (ref 4.22–5.81)
RDW: 13.2 % (ref 11.5–15.5)
WBC Count: 6.7 10*3/uL (ref 4.0–10.5)
nRBC: 0 % (ref 0.0–0.2)

## 2021-04-19 LAB — CMP (CANCER CENTER ONLY)
ALT: 20 U/L (ref 0–44)
AST: 19 U/L (ref 15–41)
Albumin: 4 g/dL (ref 3.5–5.0)
Alkaline Phosphatase: 45 U/L (ref 38–126)
Anion gap: 11 (ref 5–15)
BUN: 28 mg/dL — ABNORMAL HIGH (ref 8–23)
CO2: 26 mmol/L (ref 22–32)
Calcium: 9.3 mg/dL (ref 8.9–10.3)
Chloride: 104 mmol/L (ref 98–111)
Creatinine: 1.12 mg/dL (ref 0.61–1.24)
GFR, Estimated: >60 mL/min (ref >60)
Glucose, Bld: 113 mg/dL — ABNORMAL HIGH (ref 70–99)
Potassium: 3.9 mmol/L (ref 3.5–5.1)
Sodium: 141 mmol/L (ref 135–145)
Total Bilirubin: 0.9 mg/dL (ref 0.3–1.2)
Total Protein: 7.1 g/dL (ref 6.5–8.1)

## 2021-04-19 LAB — IRON AND TIBC
Iron: 119 ug/dL (ref 42–163)
Saturation Ratios: 36 % (ref 20–55)
TIBC: 328 ug/dL (ref 202–409)
UIBC: 208 ug/dL (ref 117–376)

## 2021-04-19 LAB — FERRITIN: Ferritin: 145 ng/mL (ref 24–336)

## 2021-04-19 NOTE — Progress Notes (Signed)
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 Lorenso Courier  10/11/2020: started phebotomy q 2 weeks and hydroxyurea 568m BID 11/15/2020: WBC 9.0, Hgb 14.9, HCT 44.5%, Plt 384. Holding phlebotomies, patient at target.  02/21/2021: WBC 5.8, Hgb 15.1, Hct 42.8, Plt 208  #Hx of Prostate Cancer 2006: robotic resection of prostate.  11/16/2019: PSA 0.21([facility])  08/08/2020: PSA 0.36 ([facility]   Interval History:  Justin Laser89y.o. male with medical history significant for polycythemia vera who presents for a follow up visit. The patient's last visit was on 02/21/2021. In the interim since the last visit the patient has reached and stayed at his goal hematocrit of less than 45% hydroxyurea 500 mg twice daily.  On exam today Justin Gibbs reports his energy has been so-so since her last visit.  He notes that he is "slower than usual".  He reports that his blood pressure is typically reported high at doctor's offices but "pretty good" at home.  He notes that he is tolerating his hydroxyurea therapy well with no stomach upset.  He does have some scrapes on his leg from when he "fell through the pool deck".  He has he was able to pull himself out and these wounds appear to be healing appropriately.  He is otherwise not noticing any side effects from medications or any new symptoms.  He notes he has not had any weakness, numbness, chest pain, shortness of breath, or lower extremity swelling.  A full 10 point ROS is listed below.   MEDICAL HISTORY:  Past  Medical History:  Diagnosis Date   Anxiety    Arthritis    [place] ([place]    prostate / removed 2006   Depression    High triglycerides    Hypertension    Myocardial infarction ([place]    Polycythemia vera, acquired ([place]     SURGICAL HISTORY: Past Surgical History:  Procedure Laterality Date   HERNIA REPAIR Bilateral    x 2    PROSTATECTOMY      SOCIAL HISTORY: Social History   Socioeconomic History   Marital status: Married    Spouse name: Dawn   Number of children: 3   Years of education: Not on file   Highest education level: Not on file  Occupational History   Occupation: retired     Comment: 2007  Tobacco Use   Smoking status: Former    Packs/day: 1.00    Years: 23.00    Pack years: 23.00    Types: Cigarettes, Cigars    Quit date: 06/12/1982    Years since quitting: 38.8   Smokeless tobacco: Never   Tobacco comments:    I was a light smoker  Vaping Use   Vaping Use: Never used  Substance and Sexual Activity   Alcohol use: Never   Drug use: Never   Sexual activity: Not Currently    Comment: Prostate removal  Nov. 15 2006  Other Topics Concern   Not on file  Social History Narrative   Lives with wife and son. He has a basement with a wood stove.  Social Determinants of Health   Financial Resource Strain: Low Risk    Difficulty of Paying Living Expenses: Not hard at all  Food Insecurity: No Food Insecurity   Worried About Charity fundraiser in the Last Year: Never true   [place] in the Last Year: Never true  Transportation Needs: No Transportation Needs   Lack of Transportation (Medical): No   Lack of Transportation (Non-Medical): No  Physical Activity: Sufficiently Active   Days of Exercise per Week: 7 days   Minutes of Exercise per Session: 30 min  Stress: No Stress Concern Present   Feeling of Stress : Only a little  Social Connections: Moderately Integrated   Frequency of Communication with Friends and Family: More than three times  a week   Frequency of Social Gatherings with Friends and Family: More than three times a week   Attends Religious Services: More than 4 times per year   Active Member of [employer] or Organizations: No   Attends Music therapist: Never   Marital Status: Married  Human resources officer Violence: Not At Risk   Fear of Current or Ex-Partner: No   Emotionally Abused: No   Physically Abused: No   Sexually Abused: No    FAMILY HISTORY: Family History  Problem Relation Age of Onset   Alzheimer's disease Mother    Alcohol abuse Father    Cancer Father        leukemia   Cirrhosis Father    Stomach cancer Father    Stroke Brother    Diabetes Daughter    Cancer Maternal Grandmother    Cancer Paternal Grandfather    Prostate cancer Brother    Brain cancer Brother    Thyroid cancer Sister     ALLERGIES:  has No Known Allergies.  MEDICATIONS:  Current Outpatient Medications  Medication Sig Dispense Refill   apixaban (ELIQUIS) 5 MG TABS tablet Take 1 tablet (5 mg total) by mouth 2 (two) times daily. 60 tablet 11   hydroxyurea (HYDREA) 500 MG capsule Take 1 capsule (500 mg total) by mouth 2 (two) times daily. May take with food to minimize GI side effects. 180 capsule 1   lisinopril-hydrochlorothiazide (ZESTORETIC) 20-12.5 MG tablet Take 2 tablets by mouth daily. 180 tablet 1   metoprolol tartrate (LOPRESSOR) 25 MG tablet Take 1 tablet (25 mg total) by mouth 2 (two) times daily. 90 tablet 1   Multiple Vitamin (MULTIVITAMIN) capsule Take 1 capsule by mouth daily.     PARoxetine (PAXIL) 10 MG tablet Take 0.5 tablets (5 mg total) by mouth 2 (two) times daily. 90 tablet 1   No current facility-administered medications for this visit.    REVIEW OF SYSTEMS:   Constitutional: ( - ) fevers, ( - )  chills , ( - ) night sweats Eyes: ( - ) blurriness of vision, ( - ) double vision, ( - ) watery eyes Ears, nose, mouth, throat, and face: ( - ) mucositis, ( - ) sore throat Respiratory: ( - )  cough, ( - ) dyspnea, ( - ) wheezes Cardiovascular: ( - ) palpitation, ( - ) chest discomfort, ( - ) lower extremity swelling Gastrointestinal:  ( - ) nausea, ( - ) heartburn, ( - ) change in bowel habits Skin: ( - ) abnormal skin rashes Lymphatics: ( - ) new lymphadenopathy, ( - ) easy bruising Neurological: ( - ) numbness, ( - ) tingling, ( - ) new weaknesses Behavioral/Psych: ( - ) mood change, ( - )  new changes  All other systems were reviewed with the patient and are negative.  PHYSICAL EXAMINATION:  Vitals:   04/19/21 1349  BP: (!) 145/105  Pulse: 69  Resp: 19  Temp: 98.2 F (36.8 C)  SpO2: 97%   Filed Weights   04/19/21 1349  Weight: 227 lb 1.6 oz (103 kg)    GENERAL: well appearing elderly Caucasian male alert, no distress and comfortable SKIN: skin color, texture, turgor are normal, no rashes or significant lesions EYES: conjunctiva are pink and non-injected, sclera clear LUNGS: clear to auscultation and percussion with normal breathing effort HEART: regular rate & rhythm and no murmurs and no lower extremity edema Musculoskeletal: no cyanosis of digits and no clubbing  PSYCH: alert & oriented x 3, fluent speech NEURO: no focal motor/sensory deficits  LABORATORY DATA:  I have reviewed the data as listed CBC Latest Ref Rng & Units 04/19/2021 02/21/2021 01/24/2021  WBC 4.0 - 10.5 K/uL 6.7 5.8 6.1  Hemoglobin 13.0 - 17.0 g/dL 13.9 15.1 15.4  Hematocrit 39.0 - 52.0 % 39.4 42.8 43.6  Platelets 150 - 400 K/uL 238 205 200    CMP Latest Ref Rng & Units 04/19/2021 02/21/2021 01/24/2021  Glucose 70 - 99 mg/dL 113(H) 82 92  BUN 8 - 23 mg/dL 28(H) 29(H) 32(H)  Creatinine 0.61 - 1.24 mg/dL 1.12 1.32(H) 1.37(H)  Sodium 135 - 145 mmol/L 141 141 142  Potassium 3.5 - 5.1 mmol/L 3.9 4.1 4.0  Chloride 98 - 111 mmol/L 104 102 103  CO2 22 - 32 mmol/L 26 30 29   Calcium 8.9 - 10.3 mg/dL 9.3 9.6 9.3  Total Protein 6.5 - 8.1 g/dL 7.1 7.1 7.0  Total Bilirubin 0.3 - 1.2 mg/dL 0.9 0.7 0.7   Alkaline Phos 38 - 126 U/L 45 44 46  AST 15 - 41 U/L 19 20 21   ALT 0 - 44 U/L 20 17 19     No results found for: MPROTEIN No results found for: KPAFRELGTCHN, LAMBDASER, KAPLAMBRATIO  Pathology:    RADIOGRAPHIC STUDIES: No results found.  ASSESSMENT & PLAN Justin Gibbs 80 y.o. male with medical history significant for polycythemia vera who presents for a follow up visit.   After review of the labs, the records, and discussion with the patient the findings most consistent with a polycythemia vera which is JAK2 V617 F positive.  These results were confirmed with a bone marrow biopsy.  Given the patient's age greater than 66 he is considered a high risk polycythemia vera and therefore would require cytoreductive therapy with hydroxyurea 500 mg twice daily in addition to every 2 week phlebotomies.  Goal hematocrit for this patient is less than 45%.  Additionally he is on Eliquis therapy which will serve as anticoagulation for thromboprophylaxis.  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  Patient at goal HCT goal of <45% --Today Hgb 13.9 with  HCT 39.4 --patient currently at goal HCT, will hold further phlebotomies unless needed.  --holding ASA 68m PO daily at the patient is on eliquis.  --RTC in 6 weeks for labs and 12 weeks for next clinic visit to continue monitoring  #Prostate Cancer --2006: robotic resection of prostate.  -- Due to concern for biochemical progression the patient recently underwent radiation therapy.  He completed this on 01/31/2021. --Currently followed by radiation oncology and urology. --We can follow PSAs if requested by 1 of  these other services.  No orders of the defined types were placed in this encounter.   All questions were answered. The patient knows to call the clinic with any problems,  questions or concerns.  A total of more than 30 minutes were spent on this encounter and over half of that time was spent on counseling and coordination of care as outlined above.   Ledell Peoples, MD Department of Hematology/Oncology [place] at [facility] Phone: [phone] Pager: [phone] Email: [email or web address]  04/19/2021 2:23 PM

## 2021-04-25 ENCOUNTER — Telehealth: Payer: Self-pay | Admitting: Hematology and Oncology

## 2021-04-25 NOTE — Telephone Encounter (Signed)
Scheduled per sch msg. Called and left msg  

## 2021-05-07 DIAGNOSIS — R350 Frequency of micturition: Secondary | ICD-10-CM | POA: Diagnosis not present

## 2021-05-22 ENCOUNTER — Encounter: Payer: Self-pay | Admitting: Nurse Practitioner

## 2021-05-22 ENCOUNTER — Ambulatory Visit (INDEPENDENT_AMBULATORY_CARE_PROVIDER_SITE_OTHER): Payer: Medicare Other | Admitting: Nurse Practitioner

## 2021-05-22 ENCOUNTER — Other Ambulatory Visit: Payer: Self-pay

## 2021-05-22 VITALS — BP 137/85 | HR 59 | Temp 97.1°F | Resp 20 | Ht 69.0 in | Wt 227.0 lb

## 2021-05-22 DIAGNOSIS — I480 Paroxysmal atrial fibrillation: Secondary | ICD-10-CM

## 2021-05-22 DIAGNOSIS — I10 Essential (primary) hypertension: Secondary | ICD-10-CM | POA: Diagnosis not present

## 2021-05-22 DIAGNOSIS — D45 Polycythemia vera: Secondary | ICD-10-CM | POA: Diagnosis not present

## 2021-05-22 DIAGNOSIS — F411 Generalized anxiety disorder: Secondary | ICD-10-CM

## 2021-05-22 DIAGNOSIS — R194 Change in bowel habit: Secondary | ICD-10-CM

## 2021-05-22 DIAGNOSIS — E782 Mixed hyperlipidemia: Secondary | ICD-10-CM | POA: Diagnosis not present

## 2021-05-22 DIAGNOSIS — Z6832 Body mass index (BMI) 32.0-32.9, adult: Secondary | ICD-10-CM

## 2021-05-22 MED ORDER — HYDROXYUREA 500 MG PO CAPS: 500.0000 mg | ORAL_CAPSULE | Freq: Two times a day (BID) | ORAL | 1 refills | Status: DC

## 2021-05-22 MED ORDER — PAROXETINE HCL 10 MG PO TABS: 5.0000 mg | ORAL_TABLET | Freq: Two times a day (BID) | ORAL | 1 refills | Status: DC

## 2021-05-22 MED ORDER — METOPROLOL TARTRATE 25 MG PO TABS: 25.0000 mg | ORAL_TABLET | Freq: Two times a day (BID) | ORAL | 1 refills | Status: DC

## 2021-05-22 MED ORDER — LISINOPRIL-HYDROCHLOROTHIAZIDE 20-12.5 MG PO TABS: 2.0000 | ORAL_TABLET | Freq: Every day | ORAL | 1 refills | Status: DC

## 2021-05-22 MED ORDER — APIXABAN 5 MG PO TABS: 5.0000 mg | ORAL_TABLET | Freq: Two times a day (BID) | ORAL | 11 refills | Status: DC

## 2021-05-22 NOTE — Progress Notes (Signed)
Subjective:    Patient ID: Justin Gibbs, male    DOB: 1941/03/30, 80 y.o.   MRN: [id number]   Chief Complaint: medical management of chronic issues     HPI:  1. Essential hypertension No c/o chest pain , sob or headache. Does not check blood pressure at home.' BP Readings from Last 3 Encounters:  05/22/21 137/85  04/19/21 (!) 145/105  02/21/21 (!) 151/105       2. Mixed hyperlipidemia Does try to watch diet, but does no dedicated exercise. Lab Results  Component Value Date   CHOL 155 08/16/2020   HDL 34 (L) 08/16/2020   LDLCALC 98 08/16/2020   TRIG 128 08/16/2020   CHOLHDL 4.6 08/16/2020     3. Paroxysmal atrial fibrillation (HCC) Denies heart racing or palpitations  4. Polycythemia vera ([place]) Is on eliquis without any bleeding issues Lab Results  Component Value Date   WBC 6.7 04/19/2021   HGB 13.9 04/19/2021   HCT 39.4 04/19/2021   MCV 110.4 (H) 04/19/2021   PLT 238 04/19/2021     5. GAD (generalized anxiety disorder) Is on paxil and is doing well GAD 7 : Generalized Anxiety Score 05/22/2021 02/12/2021 08/10/2020 02/08/2020  Nervous, Anxious, on Edge 0 0 0 0  Control/stop worrying 0 0 0 0  Worry too much - different things 0 0 0 0  Trouble relaxing 0 0 0 0  Restless 0 0 0 0  Easily annoyed or irritable 0 0 0 0  Afraid - awful might happen 0 0 0 0  Total GAD 7 Score 0 0 0 0  Anxiety Difficulty Not difficult at all Not difficult at all Not difficult at all Not difficult at all      6. BMI 32.0-32.9,adult No recent weight changes Wt Readings from Last 3 Encounters:  05/22/21 227 lb (103 kg)  04/19/21 227 lb 1.6 oz (103 kg)  02/21/21 224 lb 11.2 oz (101.9 kg)   BMI Readings from Last 3 Encounters:  05/22/21 33.52 kg/m  04/19/21 33.54 kg/m  02/21/21 33.18 kg/m      Outpatient Encounter Medications as of 05/22/2021  Medication Sig   apixaban (ELIQUIS) 5 MG TABS tablet Take 1 tablet (5 mg total) by mouth 2 (two) times daily.    hydroxyurea (HYDREA) 500 MG capsule Take 1 capsule (500 mg total) by mouth 2 (two) times daily. May take with food to minimize GI side effects.   lisinopril-hydrochlorothiazide (ZESTORETIC) 20-12.5 MG tablet Take 2 tablets by mouth daily.   metoprolol tartrate (LOPRESSOR) 25 MG tablet Take 1 tablet (25 mg total) by mouth 2 (two) times daily.   Multiple Vitamin (MULTIVITAMIN) capsule Take 1 capsule by mouth daily.   PARoxetine (PAXIL) 10 MG tablet Take 0.5 tablets (5 mg total) by mouth 2 (two) times daily.   No facility-administered encounter medications on file as of 05/22/2021.    Past Surgical History:  Procedure Laterality Date   HERNIA REPAIR Bilateral    x 2    PROSTATECTOMY      Family History  Problem Relation Age of Onset   Alzheimer's disease Mother    Alcohol abuse Father    Cancer Father        leukemia   Cirrhosis Father    Stomach cancer Father    Stroke Brother    Diabetes Daughter    Cancer Maternal Grandmother    Cancer Paternal Grandfather    Prostate cancer Brother    Brain cancer Brother  Thyroid cancer Sister     New complaints: Bowel habits have changed- use to go daily now skips days but on days he gos he will go several times. Formation of stool changes frequenlty.  Social history: Lives with hs wife  Controlled substance contract: n/a     Review of Systems  Constitutional:  Negative for diaphoresis.  Eyes:  Negative for pain.  Respiratory:  Negative for shortness of breath.   Cardiovascular:  Negative for chest pain, palpitations and leg swelling.  Gastrointestinal:  Negative for abdominal pain.  Endocrine: Negative for polydipsia.  Skin:  Negative for rash.  Neurological:  Negative for dizziness, weakness and headaches.  Hematological:  Does not bruise/bleed easily.  All other systems reviewed and are negative.     Objective:   Physical Exam Vitals and nursing note reviewed.  Constitutional:      Appearance: Normal appearance. He  is well-developed.  HENT:     Head: Normocephalic.     Nose: Nose normal.  Eyes:     Pupils: Pupils are equal, round, and reactive to light.  Neck:     Thyroid: No thyroid mass or thyromegaly.     Vascular: No carotid bruit or JVD.     Trachea: Phonation normal.  Cardiovascular:     Rate and Rhythm: Normal rate and regular rhythm.  Pulmonary:     Effort: Pulmonary effort is normal. No respiratory distress.     Breath sounds: Normal breath sounds.  Abdominal:     General: Bowel sounds are normal.     Palpations: Abdomen is soft.     Tenderness: There is no abdominal tenderness.  Musculoskeletal:        General: Normal range of motion.     Cervical back: Normal range of motion and neck supple.  Lymphadenopathy:     Cervical: No cervical adenopathy.  Skin:    General: Skin is warm and dry.  Neurological:     Mental Status: He is alert and oriented to person, place, and time.  Psychiatric:        Behavior: Behavior normal.        Thought Content: Thought content normal.        Judgment: Judgment normal.   BP 137/85   Pulse (!) 59   Temp (!) 97.1 F (36.2 C) (Temporal)   Resp 20   Ht 5\' 9"  (1.753 m)   Wt 227 lb (103 kg)   SpO2 96%   BMI 33.52 kg/m         Assessment & Plan:  Justin Gibbs comes in today with chief complaint of Medical Management of Chronic Issues   Diagnosis and orders addressed:  1. Essential hypertension Low sodium diet - hydroxyurea (HYDREA) 500 MG capsule; Take 1 capsule (500 mg total) by mouth 2 (two) times daily. May take with food to minimize GI side effects.  Dispense: 180 capsule; Refill: 1 - lisinopril-hydrochlorothiazide (ZESTORETIC) 20-12.5 MG tablet; Take 2 tablets by mouth daily.  Dispense: 180 tablet; Refill: 1  2. Mixed hyperlipidemia Low fat diet  3. Paroxysmal atrial fibrillation (HCC) Report any palpitations or heart racing - apixaban (ELIQUIS) 5 MG TABS tablet; Take 1 tablet (5 mg total) by mouth 2 (two) times daily.   Dispense: 60 tablet; Refill: 11 - metoprolol tartrate (LOPRESSOR) 25 MG tablet; Take 1 tablet (25 mg total) by mouth 2 (two) times daily.  Dispense: 90 tablet; Refill: 1  4. Polycythemia vera ([place]) Keep follow up with oncology  5. GAD (  generalized anxiety disorder) Stress management - PARoxetine (PAXIL) 10 MG tablet; Take 0.5 tablets (5 mg total) by mouth 2 (two) times daily.  Dispense: 90 tablet; Refill: 1  6. 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

## 2021-05-22 NOTE — Patient Instructions (Signed)
Stress, Adult Stress is a normal reaction to life events. Stress is what you feel when life demands more than you are used to, or more than you think you can handle. Some stress can be useful, such as studying for a test or meeting a deadline at work. Stress that occurs too often or for too long can cause problems. It can affect your emotional health and interfere with relationships and normal daily activities. Too much stress can weaken your body's defense system (immune system) and increase your risk for physical illness. If you already have a medical problem, stress can make it worse. What are the causes? All sorts of life events can cause stress. An event that causes stress for one person may not be stressful for another person. Major life events, whether positive or negative, commonly cause stress. Examples include: Losing a job or starting a new job. Losing a loved one. Moving to a new town or home. Getting married or divorced. Having a baby. Getting injured or sick. Less obvious life events can also cause stress, especially if they occur day after day or in combination with each other. Examples include: Working long hours. Driving in traffic. Caring for children. Being in debt. Being in a difficult relationship. What are the signs or symptoms? Stress can cause emotional symptoms, including: Anxiety. This is feeling worried, afraid, on edge, overwhelmed, or out of control. Anger, including irritation or impatience. Depression. This is feeling sad, down, helpless, or guilty. Trouble focusing, remembering, or making decisions. Stress can cause physical symptoms, including: Aches and pains. These may affect your head, neck, back, stomach, or other areas of your body. Tight muscles or a clenched jaw. Low energy. Trouble sleeping. Stress can cause unhealthy behaviors, including: Eating to feel better (overeating) or skipping meals. Working too much or putting off tasks. Smoking,  drinking alcohol, or using drugs to feel better. How is this diagnosed? Stress is diagnosed through an assessment by your health care provider. He or she may diagnose this condition based on: Your symptoms and any stressful life events. Your medical history. Tests to rule out other causes of your symptoms. Depending on your condition, your health care provider may refer you to a specialist for further evaluation. How is this treated? Stress management techniques are the recommended treatment for stress. Medicine is not typically recommended for the treatment of stress. Techniques to reduce your reaction to stressful life events include: Stress identification. Monitor yourself for symptoms of stress and identify what causes stress for you. These skills may help you to avoid or prepare for stressful events. Time management. Set your priorities, keep a calendar of events, and learn to say no. Taking these actions can help you avoid making too many commitments. Techniques for coping with stress include: Rethinking the problem. Try to think realistically about stressful events rather than ignoring them or overreacting. Try to find the positives in a stressful situation rather than focusing on the negatives. Exercise. Physical exercise can release both physical and emotional tension. The key is to find a form of exercise that you enjoy and do it regularly. Relaxation techniques. These relax the body and mind. The key is to find one or more that you enjoy and use the techniques regularly. Examples include: Meditation, deep breathing, or progressive relaxation techniques. Yoga or tai chi. Biofeedback, mindfulness techniques, or journaling. Listening to music, being out in nature, or participating in other hobbies. Practicing a healthy lifestyle. Eat a balanced diet, drink plenty of water, limit or  avoid caffeine, and get plenty of sleep. Having a strong support network. Spend time with family, friends,  or other people you enjoy being around. Express your feelings and talk things over with someone you trust. Counseling or talk therapy with a mental health professional may be helpful if you are having trouble managing stress on your own. Follow these instructions at home: Lifestyle  Avoid drugs. Do not use any products that contain nicotine or tobacco, such as cigarettes, e-cigarettes, and chewing tobacco. If you need help quitting, ask your health care provider. Limit alcohol intake to no more than 1 drink a day for nonpregnant women and 2 drinks a day for men. One drink equals 12 oz of beer, 5 oz of wine, or 1 oz of hard liquor Do not use alcohol or drugs to relax. Eat a balanced diet that includes fresh fruits and vegetables, whole grains, lean meats, fish, eggs, and beans, and low-fat dairy. Avoid processed foods and foods high in added fat, sugar, and salt. Exercise at least 30 minutes on 5 or more days each week. Get 7-8 hours of sleep each night. General instructions  Practice stress management techniques as discussed with your health care provider. Drink enough fluid to keep your urine clear or pale yellow. Take over-the-counter and prescription medicines only as told by your health care provider. Keep all follow-up visits as told by your health care provider. This is important. Contact a health care provider if: Your symptoms get worse. You have new symptoms. You feel overwhelmed by your problems and can no longer manage them on your own. Get help right away if: You have thoughts of hurting yourself or others. If you ever feel like you may hurt yourself or others, or have thoughts about taking your own life, get help right away. You can go to your nearest emergency department or call: Your local emergency services (911 in the [place] A suicide crisis helpline, such as the [place] at (410)025-6148. This is open 24 hours a day. Summary Stress is a  normal reaction to life events. It can cause problems if it happens too often or for too long. Practicing stress management techniques is the best way to treat stress. Counseling or talk therapy with a mental health professional may be helpful if you are having trouble managing stress on your own. This information is not intended to replace advice given to you by your health care provider. Make sure you discuss any questions you have with your health care provider. Document Revised: 09/22/2020 Document Reviewed: 03/31/2020 Elsevier Patient Education  2022 [employer].

## 2021-05-22 NOTE — Addendum Note (Signed)
Addended by: Rolena Infante on: 05/22/2021 11:21 AM   Modules accepted: Orders

## 2021-05-23 ENCOUNTER — Encounter: Payer: Self-pay | Admitting: Physician Assistant

## 2021-05-23 ENCOUNTER — Encounter: Payer: Self-pay | Admitting: Hematology and Oncology

## 2021-05-23 LAB — CMP14+EGFR
ALT: 15 IU/L (ref 0–44)
AST: 17 IU/L (ref 0–40)
Albumin/Globulin Ratio: 1.7 (ref 1.2–2.2)
Albumin: 4.3 g/dL (ref 3.7–4.7)
Alkaline Phosphatase: 57 IU/L (ref 44–121)
BUN/Creatinine Ratio: 24 (ref 10–24)
BUN: 28 mg/dL — ABNORMAL HIGH (ref 8–27)
Bilirubin Total: 0.6 mg/dL (ref 0.0–1.2)
CO2: 26 mmol/L (ref 20–29)
Calcium: 9.3 mg/dL (ref 8.6–10.2)
Chloride: 101 mmol/L (ref 96–106)
Creatinine, Ser: 1.18 mg/dL (ref 0.76–1.27)
Globulin, Total: 2.6 g/dL (ref 1.5–4.5)
Glucose: 99 mg/dL (ref 70–99)
Potassium: 4.6 mmol/L (ref 3.5–5.2)
Sodium: 143 mmol/L (ref 134–144)
Total Protein: 6.9 g/dL (ref 6.0–8.5)
eGFR: 63 mL/min/{1.73_m2} (ref >59)

## 2021-05-23 LAB — CBC WITH DIFFERENTIAL/PLATELET
Basophils Absolute: 0 10*3/uL (ref 0.0–0.2)
Basos: 1 %
EOS (ABSOLUTE): 0.1 10*3/uL (ref 0.0–0.4)
Eos: 2 %
Hematocrit: 41.6 % (ref 37.5–51.0)
Hemoglobin: 14.6 g/dL (ref 13.0–17.7)
Immature Grans (Abs): 0 10*3/uL (ref 0.0–0.1)
Immature Granulocytes: 1 %
Lymphocytes Absolute: 0.8 10*3/uL (ref 0.7–3.1)
Lymphs: 13 %
MCH: 39.1 pg — ABNORMAL HIGH (ref 26.6–33.0)
MCHC: 35.1 g/dL (ref 31.5–35.7)
MCV: 112 fL — ABNORMAL HIGH (ref 79–97)
Monocytes Absolute: 0.5 10*3/uL (ref 0.1–0.9)
Monocytes: 8 %
Neutrophils Absolute: 4.6 10*3/uL (ref 1.4–7.0)
Neutrophils: 75 %
Platelets: 229 10*3/uL (ref 150–450)
RBC: 3.73 x10E6/uL — ABNORMAL LOW (ref 4.14–5.80)
RDW: 12.1 % (ref 11.6–15.4)
WBC: 6 10*3/uL (ref 3.4–10.8)

## 2021-05-23 LAB — LIPID PANEL
Chol/HDL Ratio: 4 ratio (ref 0.0–5.0)
Cholesterol, Total: 166 mg/dL (ref 100–199)
HDL: 41 mg/dL (ref >39)
LDL Chol Calc (NIH): 95 mg/dL (ref 0–99)
Triglycerides: 175 mg/dL — ABNORMAL HIGH (ref 0–149)
VLDL Cholesterol Cal: 30 mg/dL (ref 5–40)

## 2021-05-24 ENCOUNTER — Telehealth: Payer: Self-pay | Admitting: *Deleted

## 2021-05-29 ENCOUNTER — Encounter: Payer: Self-pay | Admitting: Internal Medicine

## 2021-05-31 ENCOUNTER — Other Ambulatory Visit: Payer: Medicare Other

## 2021-06-06 ENCOUNTER — Encounter: Payer: Self-pay | Admitting: Internal Medicine

## 2021-06-12 ENCOUNTER — Inpatient Hospital Stay: Payer: Medicare Other | Attending: Hematology and Oncology | Admitting: *Deleted

## 2021-06-12 ENCOUNTER — Other Ambulatory Visit: Payer: Self-pay

## 2021-06-12 ENCOUNTER — Encounter: Payer: Self-pay | Admitting: *Deleted

## 2021-06-12 DIAGNOSIS — C61 Malignant neoplasm of prostate: Secondary | ICD-10-CM

## 2021-06-12 DIAGNOSIS — Z191 Hormone sensitive malignancy status: Secondary | ICD-10-CM

## 2021-06-12 NOTE — Progress Notes (Signed)
  2 Identifiers used for verification purposes for this telephone visit. SCP reviewed and completed. SDOH assessed and completed. Pt still has nocturia 3-4x a night, but is able to go back to sleep. Pt states urine flow is steady. Pt has seen changes in his bowel consistency. He says, bowels can be sporadic since radiation ranging in color from dark brown to dark yellow being soft mushy to sticky consistency. He sometimes passes lots of gas. Denies pain. Pt has a consultation with Dr. Abbey Chatters, gastroenterologist in December.Last colonoscopy was 2013.Marland Kitchen Pt's appetite is good and he does exercise 3x a week. Pt has received flu vaccine and Pneumonia vaccine in 2022. Chart will be updated. He will return to [facility] for appt in Jan,2023.

## 2021-07-04 ENCOUNTER — Ambulatory Visit: Payer: Medicare Other | Admitting: Internal Medicine

## 2021-07-12 ENCOUNTER — Other Ambulatory Visit: Payer: Medicare Other

## 2021-07-12 ENCOUNTER — Ambulatory Visit: Payer: Medicare Other | Admitting: Hematology and Oncology

## 2021-07-13 ENCOUNTER — Encounter: Payer: Medicare Other | Admitting: Physician Assistant

## 2021-07-13 ENCOUNTER — Other Ambulatory Visit: Payer: Self-pay | Admitting: *Deleted

## 2021-07-13 ENCOUNTER — Inpatient Hospital Stay: Payer: Medicare Other | Attending: Hematology and Oncology

## 2021-07-13 ENCOUNTER — Other Ambulatory Visit: Payer: Self-pay

## 2021-07-13 DIAGNOSIS — Z79899 Other long term (current) drug therapy: Secondary | ICD-10-CM | POA: Diagnosis not present

## 2021-07-13 DIAGNOSIS — D45 Polycythemia vera: Secondary | ICD-10-CM | POA: Insufficient documentation

## 2021-07-13 DIAGNOSIS — Z8546 Personal history of malignant neoplasm of prostate: Secondary | ICD-10-CM | POA: Insufficient documentation

## 2021-07-13 LAB — CMP (CANCER CENTER ONLY)
ALT: 18 U/L (ref 0–44)
AST: 19 U/L (ref 15–41)
Albumin: 4.1 g/dL (ref 3.5–5.0)
Alkaline Phosphatase: 54 U/L (ref 38–126)
Anion gap: 10 (ref 5–15)
BUN: 28 mg/dL — ABNORMAL HIGH (ref 8–23)
CO2: 29 mmol/L (ref 22–32)
Calcium: 9.3 mg/dL (ref 8.9–10.3)
Chloride: 102 mmol/L (ref 98–111)
Creatinine: 1.25 mg/dL — ABNORMAL HIGH (ref 0.61–1.24)
GFR, Estimated: 59 mL/min — ABNORMAL LOW (ref >60)
Glucose, Bld: 97 mg/dL (ref 70–99)
Potassium: 4.4 mmol/L (ref 3.5–5.1)
Sodium: 141 mmol/L (ref 135–145)
Total Bilirubin: 0.7 mg/dL (ref 0.3–1.2)
Total Protein: 7.4 g/dL (ref 6.5–8.1)

## 2021-07-13 LAB — IRON AND TIBC
Iron: 108 ug/dL (ref 42–163)
Saturation Ratios: 31 % (ref 20–55)
TIBC: 346 ug/dL (ref 202–409)
UIBC: 239 ug/dL (ref 117–376)

## 2021-07-13 LAB — CBC WITH DIFFERENTIAL (CANCER CENTER ONLY)
Abs Immature Granulocytes: 0.04 10*3/uL (ref 0.00–0.07)
Basophils Absolute: 0.1 10*3/uL (ref 0.0–0.1)
Basophils Relative: 1 %
Eosinophils Absolute: 0.2 10*3/uL (ref 0.0–0.5)
Eosinophils Relative: 2 %
HCT: 42.7 % (ref 39.0–52.0)
Hemoglobin: 14.9 g/dL (ref 13.0–17.0)
Immature Granulocytes: 1 %
Lymphocytes Relative: 12 %
Lymphs Abs: 0.8 10*3/uL (ref 0.7–4.0)
MCH: 38.4 pg — ABNORMAL HIGH (ref 26.0–34.0)
MCHC: 34.9 g/dL (ref 30.0–36.0)
MCV: 110.1 fL — ABNORMAL HIGH (ref 80.0–100.0)
Monocytes Absolute: 0.5 10*3/uL (ref 0.1–1.0)
Monocytes Relative: 8 %
Neutro Abs: 5.4 10*3/uL (ref 1.7–7.7)
Neutrophils Relative %: 76 %
Platelet Count: 231 10*3/uL (ref 150–400)
RBC: 3.88 MIL/uL — ABNORMAL LOW (ref 4.22–5.81)
RDW: 12.9 % (ref 11.5–15.5)
WBC Count: 7.1 10*3/uL (ref 4.0–10.5)
nRBC: 0 % (ref 0.0–0.2)

## 2021-07-13 LAB — FERRITIN: Ferritin: 131 ng/mL (ref 24–336)

## 2021-07-17 ENCOUNTER — Telehealth: Payer: Self-pay | Admitting: Hematology and Oncology

## 2021-07-17 NOTE — Telephone Encounter (Signed)
Scheduled per sch msg. Called and spoke with patient. Confirmed appt  

## 2021-07-19 ENCOUNTER — Telehealth: Payer: Self-pay | Admitting: *Deleted

## 2021-07-19 ENCOUNTER — Other Ambulatory Visit: Payer: Self-pay

## 2021-07-19 ENCOUNTER — Encounter: Payer: Self-pay | Admitting: Internal Medicine

## 2021-07-19 ENCOUNTER — Ambulatory Visit: Payer: Medicare Other | Admitting: Internal Medicine

## 2021-07-19 VITALS — BP 150/94 | HR 81 | Temp 97.6°F | Ht 69.0 in | Wt 232.4 lb

## 2021-07-19 DIAGNOSIS — R197 Diarrhea, unspecified: Secondary | ICD-10-CM | POA: Diagnosis not present

## 2021-07-19 DIAGNOSIS — R195 Other fecal abnormalities: Secondary | ICD-10-CM

## 2021-07-19 NOTE — Progress Notes (Signed)
Primary Care Physician:  Chevis Pretty, FNP Primary Gastroenterologist:  Dr. Abbey Chatters  Chief Complaint  Patient presents with   Diarrhea    About once a week    HPI:   Justin Gibbs is a 80 y.o. male who presents to clinic today by referral from his PCP Chevis Pretty for evaluation.  Patient states for 3 to 4 months he has had intermittent diarrhea.  This occurs at random times.  Will have multiple loose stools during the day.  In between episodes he states that his stools are normal for the most part though he has noticed a change in caliber.  Notes thin stools typically which is different for him.  Last colonoscopy 2012 at [facility] which she states was WNL.  Notes having a couple of benign as well.  I do not have access to this report however.  No melena hematochezia.  No unintentional weight loss.  No abdominal pain.  Does have a history of prostate cancer and underwent 38 radiation treatments earlier this year from May until July.  Does state his symptoms started after treatment was completed  Patient chronically on Eliquis for atrial fibrillation.    Past Medical History:  Diagnosis Date   Anxiety    Arthritis    Cancer ([place])    prostate / removed 2006   Depression    High triglycerides    Hypertension    Myocardial infarction ([place])    Polycythemia vera, acquired ([place])     Past Surgical History:  Procedure Laterality Date   HERNIA REPAIR Bilateral    x 2    PROSTATECTOMY      Current Outpatient Medications  Medication Sig Dispense Refill   apixaban (ELIQUIS) 5 MG TABS tablet Take 1 tablet (5 mg total) by mouth 2 (two) times daily. 60 tablet 11   hydroxyurea (HYDREA) 500 MG capsule Take 1 capsule (500 mg total) by mouth 2 (two) times daily. May take with food to minimize GI side effects. 180 capsule 1   lisinopril-hydrochlorothiazide (ZESTORETIC) 20-12.5 MG tablet Take 2 tablets by mouth daily. 180 tablet 1   metoprolol tartrate (LOPRESSOR)  25 MG tablet Take 1 tablet (25 mg total) by mouth 2 (two) times daily. 90 tablet 1   Multiple Vitamin (MULTIVITAMIN) capsule Take 1 capsule by mouth daily.     PARoxetine (PAXIL) 10 MG tablet Take 0.5 tablets (5 mg total) by mouth 2 (two) times daily. 90 tablet 1   No current facility-administered medications for this visit.    Allergies as of 07/19/2021   (No Known Allergies)    Family History  Problem Relation Age of Onset   Alzheimer's disease Mother    Alcohol abuse Father    Cancer Father        leukemia   Cirrhosis Father    Stomach cancer Father    Stroke Brother    Diabetes Daughter    Cancer Maternal Grandmother    Cancer Paternal Grandfather    Prostate cancer Brother    Brain cancer Brother    Thyroid cancer Sister     Social History   Socioeconomic History   Marital status: Married    Spouse name: Dawn   Number of children: 3   Years of education: Not on file   Highest education level: Not on file  Occupational History   Occupation: retired     Comment: 2007  Tobacco Use   Smoking status: Former    Packs/day: 1.00  Years: 23.00    Pack years: 23.00    Types: Cigarettes, Cigars    Quit date: 06/12/1982    Years since quitting: 39.1   Smokeless tobacco: Never   Tobacco comments:    I was a light smoker  Vaping Use   Vaping Use: Never used  Substance and Sexual Activity   Alcohol use: Never   Drug use: Never   Sexual activity: Not Currently    Comment: Prostate removal  Nov. 15 2006  Other Topics Concern   Not on file  Social History Narrative   Lives with wife and son. He has a basement with a wood stove.   Social Determinants of Health   Financial Resource Strain: Low Risk    Difficulty of Paying Living Expenses: Not hard at all  Food Insecurity: No Food Insecurity   Worried About Charity fundraiser in the Last Year: Never true   Rachel in the Last Year: Never true  Transportation Needs: No Transportation Needs   Lack of  Transportation (Medical): No   Lack of Transportation (Non-Medical): No  Physical Activity: Sufficiently Active   Days of Exercise per Week: 5 days   Minutes of Exercise per Session: 30 min  Stress: No Stress Concern Present   Feeling of Stress : Not at all  Social Connections: Moderately Integrated   Frequency of Communication with Friends and Family: More than three times a week   Frequency of Social Gatherings with Friends and Family: More than three times a week   Attends Religious Services: More than 4 times per year   Active Member of [employer] or Organizations: No   Attends Archivist Meetings: Never   Marital Status: Married  Human resources officer Violence: Not At Risk   Fear of Current or Ex-Partner: No   Emotionally Abused: No   Physically Abused: No   Sexually Abused: No    Subjective: Review of Systems  Constitutional:  Negative for chills and fever.  HENT:  Negative for congestion and hearing loss.   Eyes:  Negative for blurred vision and double vision.  Respiratory:  Negative for cough and shortness of breath.   Cardiovascular:  Negative for chest pain and palpitations.  Gastrointestinal:  Positive for diarrhea. Negative for abdominal pain, blood in stool, constipation, heartburn, melena and vomiting.       Change in stool caliber  Genitourinary:  Negative for dysuria and urgency.  Musculoskeletal:  Negative for joint pain and myalgias.  Skin:  Negative for itching and rash.  Neurological:  Negative for dizziness and headaches.  Psychiatric/Behavioral:  Negative for depression. The patient is not nervous/anxious.       Objective: BP (!) 150/94    Pulse 81    Temp 97.6 F (36.4 C)    Ht 5\' 9"  (1.753 m)    Wt 232 lb 6.4 oz (105.4 kg)    BMI 34.32 kg/m  Physical Exam Constitutional:      Appearance: Normal appearance.  HENT:     Head: Normocephalic and atraumatic.  Eyes:     Extraocular Movements: Extraocular movements intact.     Conjunctiva/sclera:  Conjunctivae normal.  Cardiovascular:     Rate and Rhythm: Normal rate. Rhythm irregular.  Pulmonary:     Effort: Pulmonary effort is normal.     Breath sounds: Normal breath sounds.  Abdominal:     General: Bowel sounds are normal.     Palpations: Abdomen is soft.  Musculoskeletal:  General: Normal range of motion.     Cervical back: Normal range of motion and neck supple.  Skin:    General: Skin is warm.  Neurological:     General: No focal deficit present.     Mental Status: He is alert and oriented to person, place, and time.  Psychiatric:        Mood and Affect: Mood normal.        Behavior: Behavior normal.     Assessment: *Diarrhea *Change in stool caliber  Plan: Discussed patient's symptoms in depth with him today.  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  No rectal discomfort.  Will schedule for diagnostic colonoscopy.The risks including infection, bleed, or perforation as well as benefits, limitations, alternatives and imponderables have been reviewed with the patient. Questions have been answered. All parties agreeable.  Patient will need to hold his Eliquis for 48 hours prior to colonoscopy.  Further recommendations to follow.   07/19/2021 2:49 PM   Disclaimer: This note was dictated with voice recognition software. Similar sounding words can inadvertently be transcribed and may not be corrected upon review.

## 2021-07-19 NOTE — Telephone Encounter (Signed)
Patient with diagnosis of afib on Eliquis for anticoagulation.    Procedure: colonoscopy Date of procedure: TBD  CHA2DS2-VASc Score = 3  This indicates a 3.2% annual risk of stroke. The patient's score is based upon: CHF History: 0 HTN History: 1 Diabetes History: 0 Stroke History: 0 Vascular Disease History: 0 Age Score: 2 Gender Score: 0   CrCl 26mL/min using adjusted body weight Platelet count 231K  Per office protocol, patient can hold Eliquis for 2 days prior to procedure as requested.

## 2021-07-19 NOTE — Telephone Encounter (Signed)
° °  Pre-operative Risk Assessment    Patient Name: Justin Gibbs  DOB: 1941-04-12 MRN: [id number]       Request for Surgical Clearance    Procedure:   COLONOSCOPY ASA III   Date of Surgery:  Clearance TBD                                 Surgeon:  DR. Abbey Chatters Surgeon's Group or Practice Name:   [facility]  Phone number:   [phone] Fax number:   [phone]   Type of Clearance Requested:   - Pharmacy:  Hold Apixaban (Eliquis) X'S 2 DAYS   Type of Anesthesia:   PROPOFOL    Additional requests/questions:    Astrid Divine   07/19/2021, 2:54 PM

## 2021-07-19 NOTE — Telephone Encounter (Signed)
° °  Primary Cardiologist: Minus Breeding, MD  Clinical pharmacist reviewed patient's past medical history, medications, and chart.  Justin Gibbs received the following recommendations :  Patient with diagnosis of afib on Eliquis for anticoagulation.     Procedure: colonoscopy Date of procedure: TBD   CHA2DS2-VASc Score = 3  This indicates a 3.2% annual risk of stroke. The patient's score is based upon: CHF History: 0 HTN History: 1 Diabetes History: 0 Stroke History: 0 Vascular Disease History: 0 Age Score: 2 Gender Score: 0   CrCl 18mL/min using adjusted body weight Platelet count 231K   Per office protocol, patient can hold Eliquis for 2 days prior to procedure as requested.  I will route this recommendation to the requesting party via Epic fax function and remove from pre-op pool.  Please call with questions.  Jossie Ng. Briley Bumgarner NP-C    07/19/2021, 4:46 PM [address] Office [phone] Fax [phone]

## 2021-07-19 NOTE — Patient Instructions (Signed)
We will schedule you for colonoscopy to evaluate your diarrhea as well as change in stool caliber.  You will need to hold your Eliquis for 48 hours prior to procedure.  It was very nice meeting you today.  I hope you have a Merry Christmas.  Dr. Abbey Chatters  At [facility] we value your feedback. You may receive a survey about your visit today. Please share your experience as we strive to create trusting relationships with our patients to provide genuine, compassionate, quality care.  We appreciate your understanding and patience as we review any laboratory studies, imaging, and other diagnostic tests that are ordered as we care for you. Our office policy is 5 business days for review of these results, and any emergent or urgent results are addressed in a timely manner for your best interest. If you do not hear from our office in 1 week, please contact us.   We also encourage the use of MyChart, which contains your medical information for your review as well. If you are not enrolled in this feature, an access code is on this after visit summary for your convenience. Thank you for allowing [place] to be involved in your care.  It was great to see you today!  I hope you have a great rest of your Winter!    Justin Gibbs. Abbey Chatters, D.O. Gastroenterology and Hepatology [facility]

## 2021-07-25 ENCOUNTER — Telehealth: Payer: Self-pay

## 2021-07-25 NOTE — Telephone Encounter (Signed)
Pt has been cleared from the note 07/19/2021 to hold his Eliquis for 2 days prior to procedure as requested.

## 2021-07-25 NOTE — Telephone Encounter (Signed)
Called pt, no answer and no VM 

## 2021-07-25 NOTE — Telephone Encounter (Signed)
Okay to schedule colonoscopy, hold Eliquis x48 hours.  Thank you

## 2021-07-25 NOTE — Telephone Encounter (Signed)
Dr. Abbey Chatters Pt has been cleared to hold Eliquis x 2 days prior to procedure. Please sign off on documentation.

## 2021-07-25 NOTE — Telephone Encounter (Signed)
Noted. Please forward documentation to Dr. Abbey Chatters to sign off on

## 2021-07-25 NOTE — Telephone Encounter (Signed)
error 

## 2021-07-26 NOTE — Telephone Encounter (Signed)
Called pt, no answer and no VM 

## 2021-07-27 ENCOUNTER — Inpatient Hospital Stay: Payer: Medicare Other | Admitting: Hematology and Oncology

## 2021-07-27 ENCOUNTER — Telehealth: Payer: Self-pay | Admitting: Hematology and Oncology

## 2021-07-27 ENCOUNTER — Inpatient Hospital Stay: Payer: Medicare Other

## 2021-07-27 NOTE — Telephone Encounter (Signed)
Rescheduled 12/30 due to provider pal, patient has been called and notified of new rescheduled appointment.

## 2021-07-31 DIAGNOSIS — D485 Neoplasm of uncertain behavior of skin: Secondary | ICD-10-CM | POA: Diagnosis not present

## 2021-07-31 DIAGNOSIS — D2339 Other benign neoplasm of skin of other parts of face: Secondary | ICD-10-CM | POA: Diagnosis not present

## 2021-07-31 DIAGNOSIS — Z08 Encounter for follow-up examination after completed treatment for malignant neoplasm: Secondary | ICD-10-CM | POA: Diagnosis not present

## 2021-07-31 DIAGNOSIS — L918 Other hypertrophic disorders of the skin: Secondary | ICD-10-CM | POA: Diagnosis not present

## 2021-07-31 DIAGNOSIS — Z85828 Personal history of other malignant neoplasm of skin: Secondary | ICD-10-CM | POA: Diagnosis not present

## 2021-07-31 DIAGNOSIS — D225 Melanocytic nevi of trunk: Secondary | ICD-10-CM | POA: Diagnosis not present

## 2021-07-31 NOTE — Telephone Encounter (Signed)
Letter mailed

## 2021-08-06 ENCOUNTER — Other Ambulatory Visit: Payer: Self-pay

## 2021-08-06 MED ORDER — PEG 3350-KCL-NA BICARB-NACL 420 G PO SOLR: 4000.0000 mL | ORAL | 0 refills | Status: DC

## 2021-08-06 NOTE — Telephone Encounter (Signed)
Mindy spoke to pt this morning. TCS scheduled for 08/27/21 at 8:00am. Rx for prep sent to pharmacy. Orders entered.  PA for TCS submitted via [facility] website. PA# [id number], valid 08/27/21-11/25/21.

## 2021-08-06 NOTE — Telephone Encounter (Signed)
Pre-op appt 08/23/21. Appt letter mailed with procedure instructions.

## 2021-08-16 ENCOUNTER — Other Ambulatory Visit: Payer: Self-pay | Admitting: *Deleted

## 2021-08-16 DIAGNOSIS — D45 Polycythemia vera: Secondary | ICD-10-CM

## 2021-08-17 ENCOUNTER — Ambulatory Visit: Payer: Medicare Other | Admitting: Hematology and Oncology

## 2021-08-17 ENCOUNTER — Telehealth: Payer: Self-pay | Admitting: *Deleted

## 2021-08-17 ENCOUNTER — Inpatient Hospital Stay: Payer: Medicare Other | Admitting: Physician Assistant

## 2021-08-17 ENCOUNTER — Inpatient Hospital Stay: Payer: Medicare Other | Attending: Hematology and Oncology

## 2021-08-17 ENCOUNTER — Telehealth: Payer: Self-pay | Admitting: Hematology and Oncology

## 2021-08-17 ENCOUNTER — Other Ambulatory Visit: Payer: Self-pay

## 2021-08-17 DIAGNOSIS — Z923 Personal history of irradiation: Secondary | ICD-10-CM | POA: Insufficient documentation

## 2021-08-17 DIAGNOSIS — Z7901 Long term (current) use of anticoagulants: Secondary | ICD-10-CM | POA: Insufficient documentation

## 2021-08-17 DIAGNOSIS — D45 Polycythemia vera: Secondary | ICD-10-CM | POA: Insufficient documentation

## 2021-08-17 DIAGNOSIS — Z8546 Personal history of malignant neoplasm of prostate: Secondary | ICD-10-CM | POA: Diagnosis not present

## 2021-08-17 DIAGNOSIS — Z87891 Personal history of nicotine dependence: Secondary | ICD-10-CM | POA: Diagnosis not present

## 2021-08-17 DIAGNOSIS — Z79899 Other long term (current) drug therapy: Secondary | ICD-10-CM | POA: Diagnosis not present

## 2021-08-17 LAB — CMP (CANCER CENTER ONLY)
ALT: 17 U/L (ref 0–44)
AST: 19 U/L (ref 15–41)
Albumin: 4.1 g/dL (ref 3.5–5.0)
Alkaline Phosphatase: 45 U/L (ref 38–126)
Anion gap: 8 (ref 5–15)
BUN: 29 mg/dL — ABNORMAL HIGH (ref 8–23)
CO2: 30 mmol/L (ref 22–32)
Calcium: 9.4 mg/dL (ref 8.9–10.3)
Chloride: 102 mmol/L (ref 98–111)
Creatinine: 1.36 mg/dL — ABNORMAL HIGH (ref 0.61–1.24)
GFR, Estimated: 53 mL/min — ABNORMAL LOW (ref >60)
Glucose, Bld: 119 mg/dL — ABNORMAL HIGH (ref 70–99)
Potassium: 3.9 mmol/L (ref 3.5–5.1)
Sodium: 140 mmol/L (ref 135–145)
Total Bilirubin: 0.5 mg/dL (ref 0.3–1.2)
Total Protein: 7 g/dL (ref 6.5–8.1)

## 2021-08-17 LAB — CBC WITH DIFFERENTIAL (CANCER CENTER ONLY)
Abs Immature Granulocytes: 0.03 10*3/uL (ref 0.00–0.07)
Basophils Absolute: 0 10*3/uL (ref 0.0–0.1)
Basophils Relative: 1 %
Eosinophils Absolute: 0.1 10*3/uL (ref 0.0–0.5)
Eosinophils Relative: 2 %
HCT: 40.8 % (ref 39.0–52.0)
Hemoglobin: 14.3 g/dL (ref 13.0–17.0)
Immature Granulocytes: 1 %
Lymphocytes Relative: 13 %
Lymphs Abs: 0.8 10*3/uL (ref 0.7–4.0)
MCH: 38.5 pg — ABNORMAL HIGH (ref 26.0–34.0)
MCHC: 35 g/dL (ref 30.0–36.0)
MCV: 110 fL — ABNORMAL HIGH (ref 80.0–100.0)
Monocytes Absolute: 0.4 10*3/uL (ref 0.1–1.0)
Monocytes Relative: 7 %
Neutro Abs: 5.1 10*3/uL (ref 1.7–7.7)
Neutrophils Relative %: 76 %
Platelet Count: 228 10*3/uL (ref 150–400)
RBC: 3.71 MIL/uL — ABNORMAL LOW (ref 4.22–5.81)
RDW: 12.4 % (ref 11.5–15.5)
WBC Count: 6.6 10*3/uL (ref 4.0–10.5)
nRBC: 0 % (ref 0.0–0.2)

## 2021-08-17 NOTE — Telephone Encounter (Signed)
Spoke with patient in room 12 with his wife. Advised that Dr. Lorenso Courier had to leave the office unexpectedly. Reviewed labs with patient - a brief overview.  Advised that I can re-schedule his visit with Dr. Lorenso Courier to next week. Pt and his wife are agreeable to this. He will be scheduled for 08/21/21 @ 10 am Pt states he feels fairly well. He is concerned about his PSA going from 0.5 to.98.  He sees Dr. Jeffie Pollock @ [facility]. Pt also states he is scheduled for a colonoscopy on 08/27/21 Pt's med list reviewed. He remains on his Hydrea, 500 mg BID. His HCT is 40.8%  Scheduling message sent.

## 2021-08-17 NOTE — Telephone Encounter (Signed)
Scheduled appointment per 1/20 scheduling message. Patient is aware of upcoming appointment.

## 2021-08-21 ENCOUNTER — Inpatient Hospital Stay: Payer: Medicare Other | Admitting: Hematology and Oncology

## 2021-08-21 ENCOUNTER — Other Ambulatory Visit: Payer: Self-pay

## 2021-08-21 VITALS — BP 141/99 | HR 57 | Temp 96.9°F | Resp 17 | Wt 227.7 lb

## 2021-08-21 DIAGNOSIS — C61 Malignant neoplasm of prostate: Secondary | ICD-10-CM | POA: Diagnosis not present

## 2021-08-21 DIAGNOSIS — Z87891 Personal history of nicotine dependence: Secondary | ICD-10-CM | POA: Diagnosis not present

## 2021-08-21 DIAGNOSIS — D75839 Thrombocytosis, unspecified: Secondary | ICD-10-CM | POA: Diagnosis not present

## 2021-08-21 DIAGNOSIS — D45 Polycythemia vera: Secondary | ICD-10-CM

## 2021-08-21 DIAGNOSIS — Z7901 Long term (current) use of anticoagulants: Secondary | ICD-10-CM | POA: Diagnosis not present

## 2021-08-21 DIAGNOSIS — Z79899 Other long term (current) drug therapy: Secondary | ICD-10-CM | POA: Diagnosis not present

## 2021-08-21 DIAGNOSIS — Z923 Personal history of irradiation: Secondary | ICD-10-CM | POA: Diagnosis not present

## 2021-08-21 NOTE — Patient Instructions (Signed)
Justin Gibbs  08/21/2021     @PREFPERIOPPHARMACY @   Your procedure is scheduled on  08/27/2021.   Report to [name] at  [phone] A.M.   Call this number if you have problems the morning of surgery:  [phone]   Remember:  Follow the diet and prep instructions given to you by the office.    Your last dose of eliquis should be 08/24/2021.     Take these medicines the morning of surgery with A SIP OF WATER                                   metoprolol, paxil    Do not wear jewelry, make-up or nail polish.  Do not wear lotions, powders, or perfumes, or deodorant.  Do not shave 48 hours prior to surgery.  Men may shave face and neck.  Do not bring valuables to the hospital.  [name] is not responsible for any belongings or valuables.  Contacts, dentures or bridgework may not be worn into surgery.  Leave your suitcase in the car.  After surgery it may be brought to your room.  For patients admitted to the hospital, discharge time will be determined by your treatment team.  Patients discharged the day of surgery will not be allowed to drive home and must have someone with them for 24 hours.    Special instructions:   DO NOT smoke tobacco or vape for 24 hours before your procedure.  Please read over the following fact sheets that you were given. Anesthesia Post-op Instructions and Care and Recovery After Surgery      Colonoscopy, Adult, Care After This sheet gives you information about how to care for yourself after your procedure. Your health care provider may also give you more specific instructions. If you have problems or questions, contact your health care provider. What can I expect after the procedure? After the procedure, it is common to have: A small amount of blood in your stool for 24 hours after the procedure. Some gas. Mild cramping or bloating of your abdomen. Follow these instructions at home: Eating and drinking  Drink enough fluid to keep  your urine pale yellow. Follow instructions from your health care provider about eating or drinking restrictions. Resume your normal diet as instructed by your health care provider. Avoid heavy or fried foods that are hard to digest. Activity Rest as told by your health care provider. Avoid sitting for a long time without moving. Get up to take short walks every 1-2 hours. This is important to improve blood flow and breathing. Ask for help if you feel weak or unsteady. Return to your normal activities as told by your health care provider. Ask your health care provider what activities are safe for you. Managing cramping and bloating  Try walking around when you have cramps or feel bloated. Apply heat to your abdomen as told by your health care provider. Use the heat source that your health care provider recommends, such as a moist heat pack or a heating pad. Place a towel between your skin and the heat source. Leave the heat on for 20-30 minutes. Remove the heat if your skin turns bright red. This is especially important if you are unable to feel pain, heat, or cold. You may have a greater risk of getting burned. General instructions If you were given a sedative during the  procedure, it can affect you for several hours. Do not drive or operate machinery until your health care provider says that it is safe. For the first 24 hours after the procedure: Do not sign important documents. Do not drink alcohol. Do your regular daily activities at a slower pace than normal. Eat soft foods that are easy to digest. Take over-the-counter and prescription medicines only as told by your health care provider. Keep all follow-up visits as told by your health care provider. This is important. Contact a health care provider if: You have blood in your stool 2-3 days after the procedure. Get help right away if you have: More than a small spotting of blood in your stool. Large blood clots in your  stool. Swelling of your abdomen. Nausea or vomiting. A fever. Increasing pain in your abdomen that is not relieved with medicine. Summary After the procedure, it is common to have a small amount of blood in your stool. You may also have mild cramping and bloating of your abdomen. If you were given a sedative during the procedure, it can affect you for several hours. Do not drive or operate machinery until your health care provider says that it is safe. Get help right away if you have a lot of blood in your stool, nausea or vomiting, a fever, or increased pain in your abdomen. This information is not intended to replace advice given to you by your health care provider. Make sure you discuss any questions you have with your health care provider. Document Revised: 05/21/2019 Document Reviewed: 02/08/2019 Elsevier Patient Education  [name] After This sheet gives you information about how to care for yourself after your procedure. Your health care provider may also give you more specific instructions. If you have problems or questions, contact your health care provider. What can I expect after the procedure? After the procedure, it is common to have: Tiredness. Forgetfulness about what happened after the procedure. Impaired judgment for important decisions. Nausea or vomiting. Some difficulty with balance. Follow these instructions at home: For the time period you were told by your health care provider:   Rest as needed. Do not participate in activities where you could fall or become injured. Do not drive or use machinery. Do not drink alcohol. Do not take sleeping pills or medicines that cause drowsiness. Do not make important decisions or sign legal documents. Do not take care of children on your own. Eating and drinking Follow the diet that is recommended by your health care provider. Drink enough fluid to keep your urine pale yellow. If  you vomit: Drink water, juice, or soup when you can drink without vomiting. Make sure you have little or no nausea before eating solid foods. General instructions Have a responsible adult stay with you for the time you are told. It is important to have someone help care for you until you are awake and alert. Take over-the-counter and prescription medicines only as told by your health care provider. If you have sleep apnea, surgery and certain medicines can increase your risk for breathing problems. Follow instructions from your health care provider about wearing your sleep device: Anytime you are sleeping, including during daytime naps. While taking prescription pain medicines, sleeping medicines, or medicines that make you drowsy. Avoid smoking. Keep all follow-up visits as told by your health care provider. This is important. Contact a health care provider if: You keep feeling nauseous or you keep vomiting. You feel light-headed. You  are still sleepy or having trouble with balance after 24 hours. You develop a rash. You have a fever. You have redness or swelling around the IV site. Get help right away if: You have trouble breathing. You have new-onset confusion at home. Summary For several hours after your procedure, you may feel tired. You may also be forgetful and have poor judgment. Have a responsible adult stay with you for the time you are told. It is important to have someone help care for you until you are awake and alert. Rest as told. Do not drive or operate machinery. Do not drink alcohol or take sleeping pills. Get help right away if you have trouble breathing, or if you suddenly become confused. This information is not intended to replace advice given to you by your health care provider. Make sure you discuss any questions you have with your health care provider. Document Revised: 03/30/2020 Document Reviewed: 06/17/2019 Elsevier Patient Education  2022 [employer].

## 2021-08-21 NOTE — Progress Notes (Signed)
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 Lorenso Courier  10/11/2020: started phebotomy q 2 weeks and hydroxyurea 584m BID 11/15/2020: WBC 9.0, Hgb 14.9, HCT 44.5%, Plt 384. Holding phlebotomies, patient at target.  02/21/2021: WBC 5.8, Hgb 15.1, Hct 42.8, Plt 208 08/17/2021: WBC 6.6, Hgb 14.3, MCV 110, Plt 228  #Hx of Prostate Cancer 2006: robotic resection of prostate.  11/16/2019: PSA 0.21([facility])  08/08/2020: PSA 0.36 ([facility]   Interval History:  RHildred Laser81y.o. male with medical history significant for polycythemia vera who presents for a follow up visit. The patient's last visit was on 04/19/2021. In the interim since the last visit the patient has reached and stayed at his goal hematocrit of less than 45% hydroxyurea 500 mg twice daily.  On exam today Mr. Andes reports he has been doing well in the interim since her last visit.  He notes that his energy levels have been okay but that he gets fatigued and out of breath easily.  He notes that he did develop a lesion on his lip subsequently resolved on its own.  He reports has not had any ulcers of his ankles or other lesions in his mouth or on his tongue.  He reports he has diarrhea approximately twice per week but is not sure if  this is due to the hydroxyurea medication or the radiation for the prostate cancer.  He notes he has not had any weakness, numbness, chest pain, shortness of breath, or lower extremity swelling.  A full 10 point ROS is listed below.   MEDICAL HISTORY:  Past Medical History:  Diagnosis Date   Anxiety    Arthritis    [place] / [place]   Depression    High triglycerides    Hypertension    [place] ([place]    Polycythemia vera, acquired ([place]     SURGICAL HISTORY: Past Surgical History:  Procedure Laterality Date   HERNIA REPAIR Bilateral    x 2    PROSTATECTOMY      SOCIAL HISTORY: Social History   Socioeconomic History   Marital status: Married    Spouse name: Dawn   Number of children: 3   Years of education: Not on file   Highest education level: Not on file  Occupational History   Occupation: retired     Comment: 2007  Tobacco Use   Smoking status: Former    Packs/day: 1.00    Years: 23.00    Pack years: 23.00    Types: Cigarettes, Cigars    Quit date: 06/12/1982    Years since quitting: 39.2   Smokeless tobacco: Never   Tobacco comments:    I was a light smoker  Vaping Use   Vaping Use: Never used  Substance and Sexual Activity   Alcohol use: Never   Drug use: Never   Sexual activity:  Not Currently    Comment: Prostate removal  Nov. 15 2006  Other Topics Concern   Not on file  Social History Narrative   Lives with wife and son. He has a basement with a wood stove.   Social Determinants of Health   Financial Resource Strain: Low Risk    Difficulty of Paying Living Expenses: Not hard at all  Food Insecurity: No Food Insecurity   Worried About Charity fundraiser in the Last Year: Never true   [place] in the Last Year: Never true  Transportation Needs: No Transportation Needs   Lack of Transportation (Medical): No   Lack of Transportation (Non-Medical): No  Physical Activity: Sufficiently Active   Days of  Exercise per Week: 5 days   Minutes of Exercise per Session: 30 min  Stress: No Stress Concern Present   Feeling of Stress : Not at all  Social Connections: Moderately Integrated   Frequency of Communication with Friends and Family: More than three times a week   Frequency of Social Gatherings with Friends and Family: More than three times a week   Attends Religious Services: More than 4 times per year   Active Member of [employer] or Organizations: No   Attends Music therapist: Never   Marital Status: Married  Human resources officer Violence: Not At Risk   Fear of Current or Ex-Partner: No   Emotionally Abused: No   Physically Abused: No   Sexually Abused: No    FAMILY HISTORY: Family History  Problem Relation Age of Onset   Alzheimer's disease Mother    Alcohol abuse Father    Cancer Father        leukemia   Cirrhosis Father    Stomach cancer Father    Stroke Brother    Diabetes Daughter    Cancer Maternal Grandmother    Cancer Paternal Grandfather    Prostate cancer Brother    Brain cancer Brother    Thyroid cancer Sister     ALLERGIES:  has No Known Allergies.  MEDICATIONS:  Current Outpatient Medications  Medication Sig Dispense Refill   apixaban (ELIQUIS) 5 MG TABS tablet Take 1 tablet (5 mg total) by mouth 2 (two) times daily. 60 tablet 11   hydroxyurea (HYDREA) 500 MG capsule Take 1 capsule (500 mg total) by mouth 2 (two) times daily. May take with food to minimize GI side effects. 180 capsule 1   lisinopril-hydrochlorothiazide (ZESTORETIC) 20-12.5 MG tablet Take 2 tablets by mouth daily. (Patient taking differently: Take 1 tablet by mouth 2 (two) times daily.) 180 tablet 1   metoprolol tartrate (LOPRESSOR) 25 MG tablet Take 1 tablet (25 mg total) by mouth 2 (two) times daily. 90 tablet 1   Multiple Vitamin (MULTIVITAMIN) capsule Take 1 capsule by mouth daily.     Phenylephrine HCl (EQL NASAL DECONGESTANT NA) Place 1 spray into the nose daily as needed  (congestion).     polyethylene glycol-electrolytes (TRILYTE) 420 g solution Take 4,000 mLs by mouth as directed. 4000 mL 0   PARoxetine (PAXIL) 10 MG tablet Take 0.5 tablets (5 mg total) by mouth 2 (two) times daily. 90 tablet 1   No current facility-administered medications for this visit.    REVIEW OF SYSTEMS:   Constitutional: ( - ) fevers, ( - )  chills , ( - ) night sweats Eyes: ( - ) blurriness of vision, ( - ) double vision, ( - ) watery eyes Ears, nose, mouth, throat, and face: ( - )  mucositis, ( - ) sore throat Respiratory: ( - ) cough, ( - ) dyspnea, ( - ) wheezes Cardiovascular: ( - ) palpitation, ( - ) chest discomfort, ( - ) lower extremity swelling Gastrointestinal:  ( - ) nausea, ( - ) heartburn, ( - ) change in bowel habits Skin: ( - ) abnormal skin rashes Lymphatics: ( - ) new lymphadenopathy, ( - ) easy bruising Neurological: ( - ) numbness, ( - ) tingling, ( - ) new weaknesses Behavioral/Psych: ( - ) mood change, ( - ) new changes  All other systems were reviewed with the patient and are negative.  PHYSICAL EXAMINATION:  Vitals:   08/21/21 0953  BP: (!) 141/99  Pulse: (!) 57  Resp: 17  Temp: (!) 96.9 F (36.1 C)  SpO2: 100%   Filed Weights   08/21/21 0953  Weight: 227 lb 11.2 oz (103.3 kg)    GENERAL: well appearing elderly Caucasian male alert, no distress and comfortable SKIN: skin color, texture, turgor are normal, no rashes or significant lesions EYES: conjunctiva are pink and non-injected, sclera clear LUNGS: clear to auscultation and percussion with normal breathing effort HEART: regular rate & rhythm and no murmurs and no lower extremity edema Musculoskeletal: no cyanosis of digits and no clubbing  PSYCH: alert & oriented x 3, fluent speech NEURO: no focal motor/sensory deficits  LABORATORY DATA:  I have reviewed the data as listed CBC Latest Ref Rng & Units 08/17/2021 07/13/2021 05/22/2021  WBC 4.0 - 10.5 K/uL 6.6 7.1 6.0  Hemoglobin 13.0 -  17.0 g/dL 14.3 14.9 14.6  Hematocrit 39.0 - 52.0 % 40.8 42.7 41.6  Platelets 150 - 400 K/uL 228 231 229    CMP Latest Ref Rng & Units 08/17/2021 07/13/2021 05/22/2021  Glucose 70 - 99 mg/dL 119(H) 97 99  BUN 8 - 23 mg/dL 29(H) 28(H) 28(H)  Creatinine 0.61 - 1.24 mg/dL 1.36(H) 1.25(H) 1.18  Sodium 135 - 145 mmol/L 140 141 143  Potassium 3.5 - 5.1 mmol/L 3.9 4.4 4.6  Chloride 98 - 111 mmol/L 102 102 101  CO2 22 - 32 mmol/L 30 29 26   Calcium 8.9 - 10.3 mg/dL 9.4 9.3 9.3  Total Protein 6.5 - 8.1 g/dL 7.0 7.4 6.9  Total Bilirubin 0.3 - 1.2 mg/dL 0.5 0.7 0.6  Alkaline Phos 38 - 126 U/L 45 54 57  AST 15 - 41 U/L 19 19 17   ALT 0 - 44 U/L 17 18 15     No results found for: MPROTEIN No results found for: KPAFRELGTCHN, LAMBDASER, KAPLAMBRATIO  Pathology:    RADIOGRAPHIC STUDIES: No results found.  ASSESSMENT & PLAN Justin Gibbs 81 y.o. male with medical history significant for polycythemia vera who presents for a follow up visit.   After review of the labs, the records, and discussion with the patient the findings most consistent with a polycythemia vera which is JAK2 V617 F positive.  These results were confirmed with a bone marrow biopsy.  Given the patient's age greater than 48 he is considered a high risk polycythemia vera and therefore would require cytoreductive therapy with hydroxyurea 500 mg twice daily in addition to every 2 week phlebotomies.  Goal hematocrit for this patient is less than 45%.  Additionally he is on Eliquis therapy which will serve as anticoagulation for thromboprophylaxis.  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  Patient at goal  HCT goal of <45% --Today Hgb 14.3 with  HCT 40.8 --patient currently at goal HCT, will hold further phlebotomies unless needed.  --holding ASA 21m PO daily as the  patient is on eliquis.  --RTC in 12 weeks for labs and 6 months for next clinic visit to continue monitoring  #Prostate Cancer --2006: robotic resection of prostate.  -- Due to concern for biochemical progression the patient recently underwent radiation therapy.  He completed this on 01/31/2021. --Currently followed by radiation oncology and urology. --We can follow PSAs if requested by one of these other services.  No orders of the defined types were placed in this encounter.   All questions were answered. The patient knows to call the clinic with any problems, questions or concerns.  A total of more than 30 minutes were spent on this encounter and over half of that time was spent on counseling and coordination of care as outlined above.   JLedell Peoples MD Department of Hematology/Oncology [facility] LLCPhone: [phone]: [phone]: [email or web address]  08/21/2021 11:10 AM

## 2021-08-23 ENCOUNTER — Encounter (HOSPITAL_COMMUNITY)
Admission: RE | Admit: 2021-08-23 | Discharge: 2021-08-23 | Disposition: A | Discharge status: Home / Self Care | Payer: Medicare Other | Source: Ambulatory Visit | Attending: Internal Medicine | Admitting: Internal Medicine

## 2021-08-23 ENCOUNTER — Encounter (HOSPITAL_COMMUNITY): Payer: Self-pay

## 2021-08-23 DIAGNOSIS — Z01810 Encounter for preprocedural cardiovascular examination: Secondary | ICD-10-CM | POA: Insufficient documentation

## 2021-08-23 HISTORY — DX: Cardiac arrhythmia, unspecified: I49.9

## 2021-08-27 ENCOUNTER — Encounter (HOSPITAL_COMMUNITY): Payer: Self-pay

## 2021-08-27 ENCOUNTER — Encounter (HOSPITAL_COMMUNITY)
Admission: RE | Disposition: A | Discharge status: Home / Self Care | Payer: Self-pay | Source: Home / Self Care | Attending: Internal Medicine

## 2021-08-27 ENCOUNTER — Ambulatory Visit (HOSPITAL_COMMUNITY)
Admission: RE | Admit: 2021-08-27 | Discharge: 2021-08-27 | Disposition: A | Discharge status: Home / Self Care | Payer: Medicare Other | Attending: Internal Medicine | Admitting: Internal Medicine

## 2021-08-27 ENCOUNTER — Ambulatory Visit (HOSPITAL_COMMUNITY): Payer: Medicare Other | Admitting: Anesthesiology

## 2021-08-27 ENCOUNTER — Other Ambulatory Visit: Payer: Self-pay

## 2021-08-27 DIAGNOSIS — D122 Benign neoplasm of ascending colon: Secondary | ICD-10-CM | POA: Diagnosis not present

## 2021-08-27 DIAGNOSIS — K648 Other hemorrhoids: Secondary | ICD-10-CM | POA: Insufficient documentation

## 2021-08-27 DIAGNOSIS — K529 Noninfective gastroenteritis and colitis, unspecified: Secondary | ICD-10-CM | POA: Diagnosis not present

## 2021-08-27 DIAGNOSIS — K635 Polyp of colon: Secondary | ICD-10-CM

## 2021-08-27 DIAGNOSIS — Z7901 Long term (current) use of anticoagulants: Secondary | ICD-10-CM | POA: Diagnosis not present

## 2021-08-27 DIAGNOSIS — I10 Essential (primary) hypertension: Secondary | ICD-10-CM | POA: Diagnosis not present

## 2021-08-27 DIAGNOSIS — K5730 Diverticulosis of large intestine without perforation or abscess without bleeding: Secondary | ICD-10-CM

## 2021-08-27 DIAGNOSIS — R195 Other fecal abnormalities: Secondary | ICD-10-CM | POA: Insufficient documentation

## 2021-08-27 DIAGNOSIS — I4891 Unspecified atrial fibrillation: Secondary | ICD-10-CM | POA: Diagnosis not present

## 2021-08-27 DIAGNOSIS — I480 Paroxysmal atrial fibrillation: Secondary | ICD-10-CM | POA: Diagnosis not present

## 2021-08-27 DIAGNOSIS — Z87891 Personal history of nicotine dependence: Secondary | ICD-10-CM | POA: Insufficient documentation

## 2021-08-27 DIAGNOSIS — F418 Other specified anxiety disorders: Secondary | ICD-10-CM | POA: Diagnosis not present

## 2021-08-27 HISTORY — PX: BIOPSY: SHX5522

## 2021-08-27 HISTORY — PX: COLONOSCOPY WITH PROPOFOL: SHX5780

## 2021-08-27 HISTORY — PX: POLYPECTOMY: SHX5525

## 2021-08-27 SURGERY — COLONOSCOPY WITH PROPOFOL
Anesthesia: General

## 2021-08-27 MED ORDER — STERILE WATER FOR IRRIGATION IR SOLN
Status: DC | PRN
  Administered 2021-08-27: 120 mL

## 2021-08-27 MED ORDER — PROPOFOL 10 MG/ML IV BOLUS
INTRAVENOUS | Status: DC | PRN
  Administered 2021-08-27 (×2): 20 mg via INTRAVENOUS
  Administered 2021-08-27: 100 mg via INTRAVENOUS
  Administered 2021-08-27: 40 mg via INTRAVENOUS
  Administered 2021-08-27: 20 mg via INTRAVENOUS

## 2021-08-27 MED ORDER — LACTATED RINGERS IV SOLN: INTRAVENOUS | Status: DC

## 2021-08-27 MED ORDER — LIDOCAINE HCL (CARDIAC) PF 100 MG/5ML IV SOSY
PREFILLED_SYRINGE | INTRAVENOUS | Status: DC | PRN
  Administered 2021-08-27: 50 mg via INTRAVENOUS

## 2021-08-27 NOTE — Op Note (Signed)
[facility] Patient Name: Justin Gibbs Procedure Date: 08/27/2021 7:52 AM MRN: [id number] Date of Birth: 06-15-1941 Attending MD: Elon Alas. Abbey Chatters DO CSN: [id number] Age: 81 Admit Type: Inpatient Procedure:                Colonoscopy Indications:              Change in stool caliber Providers:                Elon Alas. Abbey Chatters, DO, Hughie Closs RN, RN, Rosina Lowenstein, RN, Aram Candela Referring MD:              Medicines:                See the Anesthesia note for documentation of the                            administered medications Complications:            No immediate complications. Estimated Blood Loss:     Estimated blood loss was minimal. Procedure:                Pre-Anesthesia Assessment:                           - The anesthesia plan was to use monitored                            anesthesia care (MAC).                           After obtaining informed consent, the colonoscope                            was passed under direct vision. Throughout the                            procedure, the patient's blood pressure, pulse, and                            oxygen saturations were monitored continuously. The                            [id number]-HQ190L ([id number]) scope was introduced through                            the anus and advanced to the the cecum, identified                            by appendiceal orifice and ileocecal valve. The                            colonoscopy was performed without difficulty. The                            patient tolerated the procedure  well. The quality                            of the bowel preparation was evaluated using the                            BBPS [facility] Bowel Preparation Scale) with scores                            of: Right Colon = 3, Transverse Colon = 3 and Left                            Colon = 3 (entire mucosa seen well with no residual                            staining, small fragments  of stool or opaque                            liquid). The total BBPS score equals 9. Scope In: 8:03:57 AM Scope Out: 8:16:24 AM Scope Withdrawal Time: 0 hours 9 minutes 41 seconds  Total Procedure Duration: 0 hours 12 minutes 27 seconds  Findings:      The perianal and digital rectal examinations were normal.      Non-bleeding internal hemorrhoids were found during endoscopy.      Scattered small and large-mouthed diverticula were found in the entire       colon.      A 6 mm polyp was found in the ascending colon. The polyp was sessile.       The polyp was removed with a cold snare. Resection and retrieval were       complete.      Biopsies for histology were taken with a cold forceps from the ascending       colon, transverse colon and descending colon for evaluation of       microscopic colitis.      The exam was otherwise without abnormality. Impression:               - Non-bleeding internal hemorrhoids.                           - Diverticulosis in the entire examined colon.                           - One 6 mm polyp in the ascending colon, removed                            with a cold snare. Resected and retrieved.                           - The examination was otherwise normal.                           - Biopsies were taken with a cold forceps from the  ascending colon, transverse colon and descending                            colon for evaluation of microscopic colitis. Moderate Sedation:      Per Anesthesia Care Recommendation:           - Patient has a contact number available for                            emergencies. The signs and symptoms of potential                            delayed complications were discussed with the                            patient. Return to normal activities tomorrow.                            Written discharge instructions were provided to the                            patient.                           -  Resume previous diet.                           - Continue present medications.                           - Await pathology results.                           - No repeat colonoscopy due to age.                           - Return to GI clinic in 4 months. Procedure Code(s):        --- Professional ---                           [id number], Colonoscopy, flexible; with removal of                            tumor(s), polyp(s), or other lesion(s) by snare                            technique                           45380, 43, Colonoscopy, flexible; with biopsy,                            single or multiple Diagnosis Code(s):        --- Professional ---                           K64.8, Other hemorrhoids  K63.5, Polyp of colon                           R19.5, Other fecal abnormalities                           K57.30, Diverticulosis of large intestine without                            perforation or abscess without bleeding CPT copyright 2019 American Medical Association. All rights reserved. The codes documented in this report are preliminary and upon coder review may  be revised to meet current compliance requirements. Elon Alas. Abbey Chatters, DO [place] Abbey Chatters, DO 08/27/2021 8:19:30 AM This report has been signed electronically. Number of Addenda: 0

## 2021-08-27 NOTE — Discharge Instructions (Addendum)
°  Colonoscopy Discharge Instructions  Read the instructions outlined below and refer to this sheet in the next few weeks. These discharge instructions provide you with general information on caring for yourself after you leave the hospital. Your doctor may also give you specific instructions. While your treatment has been planned according to the most current medical practices available, unavoidable complications occasionally occur.   ACTIVITY You may resume your regular activity, but move at a slower pace for the next 24 hours.  Take frequent rest periods for the next 24 hours.  Walking will help get rid of the air and reduce the bloated feeling in your belly (abdomen).  No driving for 24 hours (because of the medicine (anesthesia) used during the test).   Do not sign any important legal documents or operate any machinery for 24 hours (because of the anesthesia used during the test).  NUTRITION Drink plenty of fluids.  You may resume your normal diet as instructed by your doctor.  Begin with a light meal and progress to your normal diet. Heavy or fried foods are harder to digest and may make you feel sick to your stomach (nauseated).  Avoid alcoholic beverages for 24 hours or as instructed.  MEDICATIONS You may resume your normal medications unless your doctor tells you otherwise.  WHAT YOU CAN EXPECT TODAY Some feelings of bloating in the abdomen.  Passage of more gas than usual.  Spotting of blood in your stool or on the toilet paper.  IF YOU HAD POLYPS REMOVED DURING THE COLONOSCOPY: No aspirin products for 7 days or as instructed.  No alcohol for 7 days or as instructed.  Eat a soft diet for the next 24 hours.  FINDING OUT THE RESULTS OF YOUR TEST Not all test results are available during your visit. If your test results are not back during the visit, make an appointment with your caregiver to find out the results. Do not assume everything is normal if you have not heard from your  caregiver or the medical facility. It is important for you to follow up on all of your test results.  SEEK IMMEDIATE MEDICAL ATTENTION IF: You have more than a spotting of blood in your stool.  Your belly is swollen (abdominal distention).  You are nauseated or vomiting.  You have a temperature over 101.  You have abdominal pain or discomfort that is severe or gets worse throughout the day.   Your colonoscopy revealed 1 polyp(s) which I removed successfully. Await pathology results, my office will contact you.   You also have diverticulosis and internal hemorrhoids. I would recommend increasing fiber in your diet or adding OTC Benefiber/Metamucil. Be sure to drink at least 4 to 6 glasses of water daily.   I took biopsies to rule out microscopic colitis which can cause chronic diarrhea.  Follow-up with GI in 3 to 4 months.       I hope you have a great rest of your week!  Elon Alas. Abbey Chatters, D.O. Gastroenterology and Hepatology [facility]

## 2021-08-27 NOTE — Anesthesia Preprocedure Evaluation (Addendum)
Anesthesia Evaluation  Patient identified by MRN, date of birth, ID band Patient awake    Reviewed: Allergy & Precautions, NPO status , Patient's Chart, lab work & pertinent test results  History of Anesthesia Complications Negative for: history of anesthetic complications  Airway Mallampati: III  TM Distance: >3 FB Neck ROM: Full    Dental  (+) Dental Advisory Given, Missing   Pulmonary former smoker,    Pulmonary exam normal breath sounds clear to auscultation       Cardiovascular hypertension, Pt. on medications + dysrhythmias (prolonged QT) Atrial Fibrillation (-) Valvular Problems/Murmurs Rhythm:Irregular Rate:Normal  1. Left ventricular ejection fraction, by visual estimation, is 60 to 65%. The left ventricle has normal function. There is moderately increased left ventricular hypertrophy.  2. Left ventricular diastolic parameters are indeterminate.  3. The left ventricle has no regional wall motion abnormalities.  4. Global right ventricle has normal systolic function.The right ventricular size is normal. No increase in right ventricular wall thickness.  5. Left atrial size was severely dilated.  6. Right atrial size was severely dilated.  7. The mitral valve is normal in structure. Mild mitral valve  regurgitation. No evidence of mitral stenosis.  8. The tricuspid valve is normal in structure. Tricuspid valve regurgitation is mild.  9. Aortic valve regurgitation is mild to moderate.  10. The aortic valve is tricuspid. Aortic valve regurgitation is mild to moderate. No evidence of aortic valve sclerosis or stenosis.  11. Pulmonic regurgitation is mild.  12. The pulmonic valve was not well visualized. Pulmonic valve regurgitation is mild.  13. Mildly elevated pulmonary artery systolic pressure.    Neuro/Psych PSYCHIATRIC DISORDERS Anxiety Depression negative neurological ROS     GI/Hepatic negative GI ROS, Neg  liver ROS,   Endo/Other  negative endocrine ROS  Renal/GU Renal InsufficiencyRenal disease     Musculoskeletal  (+) Arthritis , Osteoarthritis,    Abdominal   Peds  Hematology  (+) Blood dyscrasia (polycythemia vera - acquired), ,   Anesthesia Other Findings Prostate cancer   Reproductive/Obstetrics                          Anesthesia Physical Anesthesia Plan  ASA: 3  Anesthesia Plan: General   Post-op Pain Management: Minimal or no pain anticipated   Induction: Intravenous  PONV Risk Score and Plan: TIVA  Airway Management Planned: Nasal Cannula and Natural Airway  Additional Equipment:   Intra-op Plan:   Post-operative Plan:   Informed Consent: I have reviewed the patients History and Physical, chart, labs and discussed the procedure including the risks, benefits and alternatives for the proposed anesthesia with the patient or authorized representative who has indicated his/her understanding and acceptance.     Dental advisory given  Plan Discussed with: CRNA and Surgeon  Anesthesia Plan Comments:         Anesthesia Quick Evaluation

## 2021-08-27 NOTE — Transfer of Care (Signed)
Immediate Anesthesia Transfer of Care Note  Patient: Justin Gibbs  Procedure(s) Performed: COLONOSCOPY WITH PROPOFOL BIOPSY POLYPECTOMY  Patient Location: Short Stay  Anesthesia Type:General  Level of Consciousness: awake  Airway & Oxygen Therapy: Patient Spontanous Breathing  Post-op Assessment: Report given to RN and Post -op Vital signs reviewed and stable  Post vital signs: Reviewed and stable  Last Vitals:  Vitals Value Taken Time  BP 122/73 08/27/21 0821  Temp 36.9 C 08/27/21 0821  Pulse 75 08/27/21 0821  Resp 18 08/27/21 0821  SpO2 99 % 08/27/21 0821    Last Pain:  Vitals:   08/27/21 0821  TempSrc: Oral  PainSc:       Patients Stated Pain Goal: 10 (77/37/36 6815)  Complications: No notable events documented.

## 2021-08-27 NOTE — H&P (Signed)
Primary Care Physician:  Chevis Pretty, FNP Primary Gastroenterologist:  Dr. Abbey Chatters  Pre-Procedure History & Physical: HPI:  Justin Gibbs is a 81 y.o. male is here for a colonoscopy to be performed for chronic diarrhea.   Past Medical History:  Diagnosis Date   Anxiety    Arthritis    Atrial fibrillation ([place]) 06/2020   Cancer [facility])    prostate / removed 2006   Depression    Dysrhythmia    High triglycerides    Hypertension    Polycythemia vera, acquired ([place])     Past Surgical History:  Procedure Laterality Date   HERNIA REPAIR Bilateral    x 2    PROSTATECTOMY      Prior to Admission medications   Medication Sig Start Date End Date Taking? Authorizing Provider  apixaban (ELIQUIS) 5 MG TABS tablet Take 1 tablet (5 mg total) by mouth 2 (two) times daily. 05/22/21  Yes Martin, Mary-Margaret, FNP  hydroxyurea (HYDREA) 500 MG capsule Take 1 capsule (500 mg total) by mouth 2 (two) times daily. May take with food to minimize GI side effects. 05/22/21  Yes Martin, Mary-Margaret, FNP  lisinopril-hydrochlorothiazide (ZESTORETIC) 20-12.5 MG tablet Take 2 tablets by mouth daily. Patient taking differently: Take 1 tablet by mouth 2 (two) times daily. 05/22/21  Yes Hassell Done, Mary-Margaret, FNP  metoprolol tartrate (LOPRESSOR) 25 MG tablet Take 1 tablet (25 mg total) by mouth 2 (two) times daily. 05/22/21  Yes Martin, Mary-Margaret, FNP  Multiple Vitamin (MULTIVITAMIN) capsule Take 1 capsule by mouth daily.   Yes [provider]  PARoxetine (PAXIL) 10 MG tablet Take 0.5 tablets (5 mg total) by mouth 2 (two) times daily. 05/22/21 08/20/21 Yes Martin, Mary-Margaret, FNP  Phenylephrine HCl (EQL NASAL DECONGESTANT NA) Place 1 spray into the nose daily as needed (congestion).   Yes [provider]  polyethylene glycol-electrolytes (TRILYTE) 420 g solution Take 4,000 mLs by mouth as directed. 08/06/21   Eloise Harman, DO    Allergies as of 08/06/2021   (No Known  Allergies)    Family History  Problem Relation Age of Onset   Alzheimer's disease Mother    Alcohol abuse Father    Cancer Father        leukemia   Cirrhosis Father    Stomach cancer Father    Stroke Brother    Diabetes Daughter    Cancer Maternal Grandmother    Cancer Paternal Grandfather    Prostate cancer Brother    Brain cancer Brother    Thyroid cancer Sister     Social History   Socioeconomic History   Marital status: Married    Spouse name: Dawn   Number of children: 3   Years of education: Not on file   Highest education level: Not on file  Occupational History   Occupation: retired     Comment: 2007  Tobacco Use   Smoking status: Former    Packs/day: 1.00    Years: 23.00    Pack years: 23.00    Types: Cigarettes, Cigars    Quit date: 06/12/1982    Years since quitting: 39.2   Smokeless tobacco: Never   Tobacco comments:    I was a light smoker  Vaping Use   Vaping Use: Never used  Substance and Sexual Activity   Alcohol use: Never   Drug use: Never   Sexual activity: Not Currently    Comment: Prostate removal  Nov. 15 2006  Other Topics Concern   Not on file  Social History Narrative   Lives with wife and son. He has a basement with a wood stove.   Social Determinants of Health   Financial Resource Strain: Low Risk    Difficulty of Paying Living Expenses: Not hard at all  Food Insecurity: No Food Insecurity   Worried About Charity fundraiser in the Last Year: Never true   [place] in the Last Year: Never true  Transportation Needs: No Transportation Needs   Lack of Transportation (Medical): No   Lack of Transportation (Non-Medical): No  Physical Activity: Sufficiently Active   Days of Exercise per Week: 5 days   Minutes of Exercise per Session: 30 min  Stress: No Stress Concern Present   Feeling of Stress : Not at all  Social Connections: Moderately Integrated   Frequency of Communication with Friends and Family: More than three  times a week   Frequency of Social Gatherings with Friends and Family: More than three times a week   Attends Religious Services: More than 4 times per year   Active Member of [employer] or Organizations: No   Attends Archivist Meetings: Never   Marital Status: Married  Human resources officer Violence: Not At Risk   Fear of Current or Ex-Partner: No   Emotionally Abused: No   Physically Abused: No   Sexually Abused: No    Review of Systems: See HPI, otherwise negative ROS  Physical Exam: Vital signs in last 24 hours: Temp:  [98.1 F (36.7 C)] 98.1 F (36.7 C) (01/30 0712) Resp:  [21] 21 (01/30 0712) BP: (171)/(94) 171/94 (01/30 0712) SpO2:  [100 %] 100 % (01/30 0712)   General:   Alert,  Well-developed, well-nourished, pleasant and cooperative in NAD Head:  Normocephalic and atraumatic. Eyes:  Sclera clear, no icterus.   Conjunctiva pink. Ears:  Normal auditory acuity. Nose:  No deformity, discharge,  or lesions. Mouth:  No deformity or lesions, dentition normal. Neck:  Supple; no masses or thyromegaly. Lungs:  Clear throughout to auscultation.   No wheezes, crackles, or rhonchi. No acute distress. Heart:  Regular rate and rhythm; no murmurs, clicks, rubs,  or gallops. Abdomen:  Soft, nontender and nondistended. No masses, hepatosplenomegaly or hernias noted. Normal bowel sounds, without guarding, and without rebound.   Msk:  Symmetrical without gross deformities. Normal posture. Extremities:  Without clubbing or edema. Neurologic:  Alert and  oriented x4;  grossly normal neurologically. Skin:  Intact without significant lesions or rashes. Cervical Nodes:  No significant cervical adenopathy. Psych:  Alert and cooperative. Normal mood and affect.  Impression/Plan: Justin Gibbs is here for a colonoscopy to be performed for chronic diarrhea.   The risks of the procedure including infection, bleed, or perforation as well as benefits, limitations, alternatives and  imponderables have been reviewed with the patient. Questions have been answered. All parties agreeable.

## 2021-08-27 NOTE — Anesthesia Postprocedure Evaluation (Signed)
Anesthesia Post Note  Patient: Justin Gibbs  Procedure(s) Performed: COLONOSCOPY WITH PROPOFOL BIOPSY POLYPECTOMY  Patient location during evaluation: Phase II Anesthesia Type: General Level of consciousness: awake and alert and oriented Pain management: pain level controlled Vital Signs Assessment: post-procedure vital signs reviewed and stable Respiratory status: spontaneous breathing, nonlabored ventilation and respiratory function stable Cardiovascular status: blood pressure returned to baseline and stable Postop Assessment: no apparent nausea or vomiting Anesthetic complications: no   No notable events documented.   Last Vitals:  Vitals:   08/27/21 0712 08/27/21 0821  BP: (!) 171/94 122/73  Pulse:  75  Resp: (!) 21 18  Temp: 36.7 C 36.9 C  SpO2: 100% 99%    Last Pain:  Vitals:   08/27/21 0821  TempSrc: Oral  PainSc:                  Emali Heyward C Marlean Mortell

## 2021-08-27 NOTE — Anesthesia Procedure Notes (Signed)
Date/Time: 08/27/2021 8:04 AM Performed by: Orlie Dakin, CRNA Pre-anesthesia Checklist: Patient identified, Emergency Drugs available, Suction available and Patient being monitored Patient Re-evaluated:Patient Re-evaluated prior to induction Oxygen Delivery Method: Nasal cannula Induction Type: IV induction Placement Confirmation: positive ETCO2

## 2021-08-28 LAB — SURGICAL PATHOLOGY

## 2021-08-29 ENCOUNTER — Encounter (HOSPITAL_COMMUNITY): Payer: Self-pay | Admitting: Internal Medicine

## 2021-09-07 DIAGNOSIS — C4402 Squamous cell carcinoma of skin of lip: Secondary | ICD-10-CM | POA: Diagnosis not present

## 2021-09-07 DIAGNOSIS — D3701 Neoplasm of uncertain behavior of lip: Secondary | ICD-10-CM | POA: Diagnosis not present

## 2021-09-17 ENCOUNTER — Other Ambulatory Visit: Payer: Self-pay | Admitting: Nurse Practitioner

## 2021-09-17 DIAGNOSIS — I480 Paroxysmal atrial fibrillation: Secondary | ICD-10-CM

## 2021-11-19 ENCOUNTER — Other Ambulatory Visit (HOSPITAL_COMMUNITY): Payer: Self-pay | Admitting: Urology

## 2021-11-19 ENCOUNTER — Inpatient Hospital Stay: Payer: Medicare Other | Attending: Hematology and Oncology

## 2021-11-19 ENCOUNTER — Other Ambulatory Visit: Payer: Self-pay | Admitting: *Deleted

## 2021-11-19 ENCOUNTER — Other Ambulatory Visit: Payer: Self-pay

## 2021-11-19 DIAGNOSIS — D45 Polycythemia vera: Secondary | ICD-10-CM

## 2021-11-19 DIAGNOSIS — C61 Malignant neoplasm of prostate: Secondary | ICD-10-CM

## 2021-11-19 LAB — CBC WITH DIFFERENTIAL (CANCER CENTER ONLY)
Abs Immature Granulocytes: 0.02 10*3/uL (ref 0.00–0.07)
Basophils Absolute: 0 10*3/uL (ref 0.0–0.1)
Basophils Relative: 1 %
Eosinophils Absolute: 0.1 10*3/uL (ref 0.0–0.5)
Eosinophils Relative: 2 %
HCT: 41 % (ref 39.0–52.0)
Hemoglobin: 14.3 g/dL (ref 13.0–17.0)
Immature Granulocytes: 0 %
Lymphocytes Relative: 14 %
Lymphs Abs: 0.8 10*3/uL (ref 0.7–4.0)
MCH: 37.6 pg — ABNORMAL HIGH (ref 26.0–34.0)
MCHC: 34.9 g/dL (ref 30.0–36.0)
MCV: 107.9 fL — ABNORMAL HIGH (ref 80.0–100.0)
Monocytes Absolute: 0.4 10*3/uL (ref 0.1–1.0)
Monocytes Relative: 6 %
Neutro Abs: 4.7 10*3/uL (ref 1.7–7.7)
Neutrophils Relative %: 77 %
Platelet Count: 240 10*3/uL (ref 150–400)
RBC: 3.8 MIL/uL — ABNORMAL LOW (ref 4.22–5.81)
RDW: 12.9 % (ref 11.5–15.5)
WBC Count: 6.1 10*3/uL (ref 4.0–10.5)
nRBC: 0 % (ref 0.0–0.2)

## 2021-11-19 LAB — IRON AND IRON BINDING CAPACITY (CC-WL,HP ONLY)
Iron: 156 ug/dL (ref 45–182)
Saturation Ratios: 46 % — ABNORMAL HIGH (ref 17.9–39.5)
TIBC: 337 ug/dL (ref 250–450)
UIBC: 181 ug/dL (ref 117–376)

## 2021-11-19 LAB — CMP (CANCER CENTER ONLY)
ALT: 15 U/L (ref 0–44)
AST: 17 U/L (ref 15–41)
Albumin: 3.9 g/dL (ref 3.5–5.0)
Alkaline Phosphatase: 46 U/L (ref 38–126)
Anion gap: 9 (ref 5–15)
BUN: 25 mg/dL — ABNORMAL HIGH (ref 8–23)
CO2: 30 mmol/L (ref 22–32)
Calcium: 8.8 mg/dL — ABNORMAL LOW (ref 8.9–10.3)
Chloride: 100 mmol/L (ref 98–111)
Creatinine: 1.18 mg/dL (ref 0.61–1.24)
GFR, Estimated: >60 mL/min (ref >60)
Glucose, Bld: 155 mg/dL — ABNORMAL HIGH (ref 70–99)
Potassium: 3.9 mmol/L (ref 3.5–5.1)
Sodium: 139 mmol/L (ref 135–145)
Total Bilirubin: 0.6 mg/dL (ref 0.3–1.2)
Total Protein: 6.7 g/dL (ref 6.5–8.1)

## 2021-11-19 LAB — FERRITIN: Ferritin: 108 ng/mL (ref 24–336)

## 2021-11-21 ENCOUNTER — Telehealth: Payer: Self-pay | Admitting: *Deleted

## 2021-11-21 NOTE — Telephone Encounter (Signed)
TCT patient regarding recent lab results. Spoke with patient and advised that his HCT is 41. Goal for him is <45. ?Advised that we do not need to see him until July 2023. ?Pt voiced understanding ?

## 2021-11-21 NOTE — Telephone Encounter (Signed)
-----   Message from Orson Slick, MD sent at 11/20/2021  9:32 AM EDT ----- ?Please let Mr. Bertsch know that his HCT is currently at target. Noted to be 41 on last labs (goal <45). We will see him back in July 2023 as scheduled.  ? ?----- Message ----- ?From: Interface, Lab In [place] ?Sent: 11/19/2021  12:55 PM EDT ?To: Orson Slick, MD ? ? ?

## 2021-11-22 ENCOUNTER — Ambulatory Visit: Payer: Medicare Other | Admitting: Nurse Practitioner

## 2021-11-27 ENCOUNTER — Encounter: Payer: Self-pay | Admitting: Internal Medicine

## 2021-11-27 ENCOUNTER — Encounter (HOSPITAL_COMMUNITY)
Admission: RE | Admit: 2021-11-27 | Discharge: 2021-11-27 | Disposition: A | Discharge status: Home / Self Care | Payer: Medicare Other | Source: Ambulatory Visit | Attending: Urology | Admitting: Urology

## 2021-11-27 DIAGNOSIS — C61 Malignant neoplasm of prostate: Secondary | ICD-10-CM | POA: Diagnosis not present

## 2021-11-27 DIAGNOSIS — C7951 Secondary malignant neoplasm of bone: Secondary | ICD-10-CM | POA: Diagnosis not present

## 2021-11-27 MED ORDER — PIFLIFOLASTAT F 18 (PYLARIFY) INJECTION
9.0000 | Freq: Once | INTRAVENOUS | Status: AC
  Administered 2021-11-27: 9.4 via INTRAVENOUS

## 2021-11-29 ENCOUNTER — Encounter: Payer: Self-pay | Admitting: Hematology and Oncology

## 2021-11-30 ENCOUNTER — Telehealth: Payer: Self-pay | Admitting: Hematology and Oncology

## 2021-11-30 DIAGNOSIS — C7951 Secondary malignant neoplasm of bone: Secondary | ICD-10-CM | POA: Diagnosis not present

## 2021-11-30 NOTE — Telephone Encounter (Signed)
.  Called patient to schedule appointment per 5/5 inbasket, patient is aware of date and time.   ?

## 2021-12-04 ENCOUNTER — Inpatient Hospital Stay: Payer: Medicare Other | Attending: Hematology and Oncology

## 2021-12-04 ENCOUNTER — Inpatient Hospital Stay (HOSPITAL_BASED_OUTPATIENT_CLINIC_OR_DEPARTMENT_OTHER): Payer: Medicare Other | Admitting: Hematology and Oncology

## 2021-12-04 ENCOUNTER — Other Ambulatory Visit: Payer: Self-pay

## 2021-12-04 ENCOUNTER — Encounter: Payer: Self-pay | Admitting: Hematology and Oncology

## 2021-12-04 ENCOUNTER — Other Ambulatory Visit (HOSPITAL_COMMUNITY): Payer: Self-pay

## 2021-12-04 ENCOUNTER — Other Ambulatory Visit: Payer: Self-pay | Admitting: Hematology and Oncology

## 2021-12-04 VITALS — BP 170/96 | HR 72 | Temp 97.9°F | Resp 18 | Ht 70.0 in | Wt 232.2 lb

## 2021-12-04 DIAGNOSIS — D75839 Thrombocytosis, unspecified: Secondary | ICD-10-CM | POA: Diagnosis not present

## 2021-12-04 DIAGNOSIS — C61 Malignant neoplasm of prostate: Secondary | ICD-10-CM | POA: Insufficient documentation

## 2021-12-04 DIAGNOSIS — Z87891 Personal history of nicotine dependence: Secondary | ICD-10-CM | POA: Insufficient documentation

## 2021-12-04 DIAGNOSIS — D45 Polycythemia vera: Secondary | ICD-10-CM

## 2021-12-04 DIAGNOSIS — C7951 Secondary malignant neoplasm of bone: Secondary | ICD-10-CM | POA: Diagnosis not present

## 2021-12-04 DIAGNOSIS — Z79899 Other long term (current) drug therapy: Secondary | ICD-10-CM | POA: Insufficient documentation

## 2021-12-04 DIAGNOSIS — Z7901 Long term (current) use of anticoagulants: Secondary | ICD-10-CM | POA: Insufficient documentation

## 2021-12-04 DIAGNOSIS — Z923 Personal history of irradiation: Secondary | ICD-10-CM | POA: Diagnosis not present

## 2021-12-04 LAB — CMP (CANCER CENTER ONLY)
ALT: 18 U/L (ref 0–44)
AST: 21 U/L (ref 15–41)
Albumin: 4.1 g/dL (ref 3.5–5.0)
Alkaline Phosphatase: 44 U/L (ref 38–126)
Anion gap: 8 (ref 5–15)
BUN: 34 mg/dL — ABNORMAL HIGH (ref 8–23)
CO2: 30 mmol/L (ref 22–32)
Calcium: 9.2 mg/dL (ref 8.9–10.3)
Chloride: 103 mmol/L (ref 98–111)
Creatinine: 1.43 mg/dL — ABNORMAL HIGH (ref 0.61–1.24)
GFR, Estimated: 50 mL/min — ABNORMAL LOW (ref >60)
Glucose, Bld: 100 mg/dL — ABNORMAL HIGH (ref 70–99)
Potassium: 4.2 mmol/L (ref 3.5–5.1)
Sodium: 141 mmol/L (ref 135–145)
Total Bilirubin: 1.1 mg/dL (ref 0.3–1.2)
Total Protein: 7.3 g/dL (ref 6.5–8.1)

## 2021-12-04 LAB — IRON AND IRON BINDING CAPACITY (CC-WL,HP ONLY)
Iron: 132 ug/dL (ref 45–182)
Saturation Ratios: 45 % — ABNORMAL HIGH (ref 17.9–39.5)
TIBC: 295 ug/dL (ref 250–450)
UIBC: 163 ug/dL

## 2021-12-04 LAB — CBC WITH DIFFERENTIAL (CANCER CENTER ONLY)
Abs Immature Granulocytes: 0.03 10*3/uL (ref 0.00–0.07)
Basophils Absolute: 0.1 10*3/uL (ref 0.0–0.1)
Basophils Relative: 1 %
Eosinophils Absolute: 0.1 10*3/uL (ref 0.0–0.5)
Eosinophils Relative: 2 %
HCT: 43.6 % (ref 39.0–52.0)
Hemoglobin: 15.1 g/dL (ref 13.0–17.0)
Immature Granulocytes: 0 %
Lymphocytes Relative: 14 %
Lymphs Abs: 1.1 10*3/uL (ref 0.7–4.0)
MCH: 37.6 pg — ABNORMAL HIGH (ref 26.0–34.0)
MCHC: 34.6 g/dL (ref 30.0–36.0)
MCV: 108.5 fL — ABNORMAL HIGH (ref 80.0–100.0)
Monocytes Absolute: 0.6 10*3/uL (ref 0.1–1.0)
Monocytes Relative: 8 %
Neutro Abs: 5.7 10*3/uL (ref 1.7–7.7)
Neutrophils Relative %: 75 %
Platelet Count: 263 10*3/uL (ref 150–400)
RBC: 4.02 MIL/uL — ABNORMAL LOW (ref 4.22–5.81)
RDW: 13 % (ref 11.5–15.5)
WBC Count: 7.6 10*3/uL (ref 4.0–10.5)
nRBC: 0 % (ref 0.0–0.2)

## 2021-12-04 LAB — LACTATE DEHYDROGENASE: LDH: 119 U/L (ref 98–192)

## 2021-12-04 LAB — FERRITIN: Ferritin: 99 ng/mL (ref 24–336)

## 2021-12-04 MED ORDER — BICALUTAMIDE 50 MG PO TABS
50.0000 mg | ORAL_TABLET | Freq: Every day | ORAL | 0 refills | Status: DC
Start: 2021-12-04 — End: 2022-01-08
  Filled 2021-12-04: qty 28, 28d supply, fill #0

## 2021-12-04 NOTE — Progress Notes (Signed)
?[place] ?[phone]   [phone] ? ?PROGRESS NOTE ? ?Patient Care Team: ?Chevis Pretty, FNP as PCP - General (Family Medicine) ?Minus Breeding, MD as PCP - Cardiology (Cardiology) ?Tyler Pita, MD as Consulting Physician (Radiation Oncology) ?Orson Slick, MD as Consulting Physician (Hematology and Oncology) ?Gardenia Phlegm, NP as Nurse Practitioner (Hematology and Oncology) ?Harmon Pier, RN as Registered Nurse ? 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 Lorenso Courier  ?10/11/2020: started phebotomy q 2 weeks and hydroxyurea '500mg'$  BID ?11/15/2020: WBC 9.0, Hgb 14.9, HCT 44.5%, Plt 384. Holding phlebotomies, patient at target.  ?02/21/2021: WBC 5.8, Hgb 15.1, Hct 42.8, Plt 208 ?08/17/2021: WBC 6.6, Hgb 14.3, MCV 110, Plt 228 ? ?#Hx of Prostate Cancer ?2006: robotic resection of prostate.  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 ?Interval History:  ?Justin Gibbs 81 y.o. male with medical history significant for polycythemia vera who presents for a follow up visit. The patient's last visit was on 08/21/2021. In the interim since the last visit the patient has reached and stayed at his goal hematocrit of less than 45% hydroxyurea 500 mg twice daily. ? ?On exam today Justin Gibbs reports he feels quite good overall.  He is not having any pains in his bones, back, or other joints.  He reports that he is also not having any issues with bleeding, bruising, or dark stools.  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  He notes he has not had any weakness, numbness, chest pain, shortness of breath, or lower extremity swelling.  A full 10 point ROS is listed below. ? ?The bulk of our discussion focused on the diagnosis of prostate cancer treatment moving forward.  Details of this discussion are noted below. ? ? ?MEDICAL HISTORY:  ?Past Medical History:  ?Diagnosis Date  ? Anxiety   ? Arthritis   ? Atrial fibrillation ([place]) 06/2020  ? Cancer [facility])   ? prostate / removed 2006  ? Depression   ? Dysrhythmia   ? High triglycerides   ? Hypertension   ? Polycythemia vera, acquired ([place])   ? ? ?SURGICAL HISTORY: ?Past Surgical History:  ?Procedure Laterality Date  ? BIOPSY  08/27/2021  ? Procedure: BIOPSY;  Surgeon: Eloise Harman, DO;  [facility];  Service: Endoscopy;;  ? COLONOSCOPY WITH PROPOFOL N/A 08/27/2021  ? Procedure: COLONOSCOPY WITH PROPOFOL;  Surgeon: Eloise Harman, DO;  Location: [facility];  Service: Endoscopy;  Laterality: N/A;  8:00am  ? HERNIA REPAIR Bilateral   ? x 2   ? POLYPECTOMY  08/27/2021  ? Procedure: POLYPECTOMY;  Surgeon: Eloise Harman, DO;  Location: [facility];  Service: Endoscopy;;  ? PROSTATECTOMY    ? ? ?SOCIAL HISTORY: ?Social History  ? ?Socioeconomic History  ? Marital status: Married  ?  Spouse name: Dawn  ? Number of children: 3  ? Years of education: Not on file  ? Highest education level: Not on file  ?Occupational History  ? Occupation: retired   ?  Comment: 2007  ?Tobacco Use  ?  Smoking status: Former  ?  Packs/day: 1.00  ?  Years: 23.00  ?  Pack years: 23.00  ?  Types: Cigarettes, Cigars  ?  Quit date: 06/12/1982  ?  Years since quitting: 39.5  ? Smokeless tobacco: Never  ? Tobacco comments:  ?  I was a light smoker  ?Vaping Use  ? Vaping Use: Never used  ?Substance and Sexual Activity  ? Alcohol use: Never  ? Drug use: Never  ? Sexual activity: Not Currently  ?  Comment: Prostate removal  Nov. 15 2006  ?Other Topics Concern   ? Not on file  ?Social History Narrative  ? Lives with wife and son. He has a basement with a wood stove.  ? ?Social Determinants of Health  ? ?Financial Resource Strain: Low Risk   ? Difficulty of Paying Living Expenses: Not hard at all  ?Food Insecurity: No Food Insecurity  ? Worried About Charity fundraiser in the Last Year: Never true  ? Ran Out of Food in the Last Year: Never true  ?Transportation Needs: No Transportation Needs  ? Lack of Transportation (Medical): No  ? Lack of Transportation (Non-Medical): No  ?Physical Activity: Sufficiently Active  ? Days of Exercise per Week: 5 days  ? Minutes of Exercise per Session: 30 min  ?Stress: No Stress Concern Present  ? Feeling of Stress : Not at all  ?Social Connections: Moderately Integrated  ? Frequency of Communication with Friends and Family: More than three times a week  ? Frequency of Social Gatherings with Friends and Family: More than three times a week  ? Attends Religious Services: More than 4 times per year  ? Active Member of Clubs or Organizations: No  ? Attends Archivist Meetings: Never  ? Marital Status: Married  ?Intimate Partner Violence: Not At Risk  ? Fear of Current or Ex-Partner: No  ? Emotionally Abused: No  ? Physically Abused: No  ? Sexually Abused: No  ? ? ?FAMILY HISTORY: ?Family History  ?Problem Relation Age of Onset  ? Alzheimer's disease Mother   ? Alcohol abuse Father   ? Cancer Father   ?     leukemia  ? Cirrhosis Father   ? Stomach cancer Father   ? Stroke Brother   ? Diabetes Daughter   ? Cancer Maternal Grandmother   ? Cancer Paternal Grandfather   ? Prostate cancer Brother   ? Brain cancer Brother   ? Thyroid cancer Sister   ? ? ?ALLERGIES:  has No Known Allergies. ? ?MEDICATIONS:  ?Current Outpatient Medications  ?Medication Sig Dispense Refill  ? apixaban (ELIQUIS) 5 MG TABS tablet Take 1 tablet (5 mg total) by mouth 2 (two) times daily. 60 tablet 11  ? bicalutamide (CASODEX) 50 MG tablet Take 1 tablet (50 mg  total) by mouth daily. 28 tablet 0  ? hydroxyurea (HYDREA) 500 MG capsule Take 1 capsule (500 mg total) by mouth 2 (two) times daily. May take with food to minimize GI side effects. 180 capsule 1  ? lisinopril-hydrochlorothiazide (ZESTORETIC) 20-12.5 MG tablet Take 2 tablets by mouth daily. (Patient taking differently: Take 1 tablet by mouth 2 (two) times daily.) 180 tablet 1  ? metoprolol tartrate (LOPRESSOR) 25 MG tablet Take 1 tablet (25 mg total) by mouth 2 (two) times daily. 90 tablet 1  ? Multiple Vitamin (MULTIVITAMIN) capsule Take 1 capsule by mouth daily.    ? Phenylephrine HCl (EQL NASAL DECONGESTANT NA) Place 1 spray into the nose daily  as needed (congestion).    ? PARoxetine (PAXIL) 10 MG tablet Take 0.5 tablets (5 mg total) by mouth 2 (two) times daily. 90 tablet 1  ? ?No current facility-administered medications for this visit.  ? ? ?REVIEW OF SYSTEMS:   ?Constitutional: ( - ) fevers, ( - )  chills , ( - ) night sweats ?Eyes: ( - ) blurriness of vision, ( - ) double vision, ( - ) watery eyes ?Ears, nose, mouth, throat, and face: ( - ) mucositis, ( - ) sore throat ?Respiratory: ( - ) cough, ( - ) dyspnea, ( - ) wheezes ?Cardiovascular: ( - ) palpitation, ( - ) chest discomfort, ( - ) lower extremity swelling ?Gastrointestinal:  ( - ) nausea, ( - ) heartburn, ( - ) change in bowel habits ?Skin: ( - ) abnormal skin rashes ?Lymphatics: ( - ) new lymphadenopathy, ( - ) easy bruising ?Neurological: ( - ) numbness, ( - ) tingling, ( - ) new weaknesses ?Behavioral/Psych: ( - ) mood change, ( - ) new changes  ?All other systems were reviewed with the patient and are negative. ? ?PHYSICAL EXAMINATION: ? ?Vitals:  ? 12/04/21 1006  ?BP: (!) 170/96  ?Pulse: 72  ?Resp: 18  ?Temp: 97.9 ?F (36.6 ?C)  ?SpO2: 98%  ? ?Filed Weights  ? 12/04/21 1006  ?Weight: 232 lb 3.2 oz (105.3 kg)  ? ? ?GENERAL: well appearing elderly Caucasian male alert, no distress and comfortable ?SKIN: skin color, texture, turgor are normal, no  rashes or significant lesions ?EYES: conjunctiva are pink and non-injected, sclera clear ?LUNGS: clear to auscultation and percussion with normal breathing effort ?HEART: regular rate & rhythm and no murmur

## 2021-12-05 ENCOUNTER — Telehealth: Payer: Self-pay | Admitting: Hematology and Oncology

## 2021-12-05 NOTE — Telephone Encounter (Signed)
Scheduled per 5/9 los, pt has been called  ?and confirmed  ?

## 2021-12-12 ENCOUNTER — Encounter: Payer: Self-pay | Admitting: Hematology and Oncology

## 2021-12-12 NOTE — Addendum Note (Signed)
Addended by: Neysa Hotter on: 12/12/2021 02:05 PM ? ? Modules accepted: Orders ? ?

## 2021-12-19 ENCOUNTER — Inpatient Hospital Stay: Payer: Medicare Other

## 2021-12-19 VITALS — BP 155/87 | HR 73 | Resp 18

## 2021-12-19 DIAGNOSIS — Z923 Personal history of irradiation: Secondary | ICD-10-CM | POA: Diagnosis not present

## 2021-12-19 DIAGNOSIS — D45 Polycythemia vera: Secondary | ICD-10-CM

## 2021-12-19 DIAGNOSIS — Z87891 Personal history of nicotine dependence: Secondary | ICD-10-CM | POA: Diagnosis not present

## 2021-12-19 DIAGNOSIS — C7951 Secondary malignant neoplasm of bone: Secondary | ICD-10-CM | POA: Diagnosis not present

## 2021-12-19 DIAGNOSIS — Z79899 Other long term (current) drug therapy: Secondary | ICD-10-CM | POA: Diagnosis not present

## 2021-12-19 DIAGNOSIS — Z7901 Long term (current) use of anticoagulants: Secondary | ICD-10-CM | POA: Diagnosis not present

## 2021-12-19 DIAGNOSIS — C61 Malignant neoplasm of prostate: Secondary | ICD-10-CM | POA: Diagnosis not present

## 2021-12-19 MED ORDER — LEUPROLIDE ACETATE (3 MONTH) 22.5 MG ~~LOC~~ KIT
22.5000 mg | PACK | Freq: Once | SUBCUTANEOUS | Status: AC
  Administered 2021-12-19: 22.5 mg via SUBCUTANEOUS
  Filled 2021-12-19: qty 22.5

## 2021-12-21 ENCOUNTER — Other Ambulatory Visit: Payer: Self-pay | Admitting: Hematology and Oncology

## 2021-12-23 ENCOUNTER — Other Ambulatory Visit: Payer: Self-pay | Admitting: Hematology and Oncology

## 2021-12-28 ENCOUNTER — Other Ambulatory Visit (HOSPITAL_COMMUNITY): Payer: Self-pay

## 2021-12-28 ENCOUNTER — Encounter: Payer: Self-pay | Admitting: Hematology and Oncology

## 2021-12-28 ENCOUNTER — Encounter (HOSPITAL_COMMUNITY): Payer: Self-pay | Admitting: Pharmacist

## 2021-12-31 ENCOUNTER — Encounter: Payer: Self-pay | Admitting: *Deleted

## 2021-12-31 ENCOUNTER — Other Ambulatory Visit: Payer: Self-pay | Admitting: Nurse Practitioner

## 2021-12-31 ENCOUNTER — Telehealth: Payer: Self-pay | Admitting: *Deleted

## 2021-12-31 DIAGNOSIS — I480 Paroxysmal atrial fibrillation: Secondary | ICD-10-CM

## 2021-12-31 NOTE — Telephone Encounter (Signed)
My Chart message sent

## 2022-01-01 ENCOUNTER — Telehealth: Payer: Self-pay | Admitting: Nurse Practitioner

## 2022-01-02 ENCOUNTER — Inpatient Hospital Stay (HOSPITAL_BASED_OUTPATIENT_CLINIC_OR_DEPARTMENT_OTHER): Payer: Medicare Other | Admitting: Hematology and Oncology

## 2022-01-02 ENCOUNTER — Other Ambulatory Visit: Payer: Self-pay

## 2022-01-02 ENCOUNTER — Inpatient Hospital Stay: Payer: Medicare Other | Attending: Hematology and Oncology

## 2022-01-02 ENCOUNTER — Other Ambulatory Visit: Payer: Self-pay | Admitting: *Deleted

## 2022-01-02 VITALS — BP 163/84 | HR 88 | Temp 97.9°F | Resp 17 | Wt 231.8 lb

## 2022-01-02 DIAGNOSIS — Z923 Personal history of irradiation: Secondary | ICD-10-CM | POA: Insufficient documentation

## 2022-01-02 DIAGNOSIS — Z7901 Long term (current) use of anticoagulants: Secondary | ICD-10-CM | POA: Insufficient documentation

## 2022-01-02 DIAGNOSIS — D45 Polycythemia vera: Secondary | ICD-10-CM

## 2022-01-02 DIAGNOSIS — Z87891 Personal history of nicotine dependence: Secondary | ICD-10-CM | POA: Insufficient documentation

## 2022-01-02 DIAGNOSIS — C61 Malignant neoplasm of prostate: Secondary | ICD-10-CM | POA: Diagnosis present

## 2022-01-02 DIAGNOSIS — C7951 Secondary malignant neoplasm of bone: Secondary | ICD-10-CM | POA: Insufficient documentation

## 2022-01-02 DIAGNOSIS — Z79899 Other long term (current) drug therapy: Secondary | ICD-10-CM | POA: Insufficient documentation

## 2022-01-02 LAB — CBC WITH DIFFERENTIAL (CANCER CENTER ONLY)
Abs Immature Granulocytes: 0.02 10*3/uL (ref 0.00–0.07)
Basophils Absolute: 0 10*3/uL (ref 0.0–0.1)
Basophils Relative: 1 %
Eosinophils Absolute: 0.1 10*3/uL (ref 0.0–0.5)
Eosinophils Relative: 1 %
HCT: 40.8 % (ref 39.0–52.0)
Hemoglobin: 14.5 g/dL (ref 13.0–17.0)
Immature Granulocytes: 0 %
Lymphocytes Relative: 13 %
Lymphs Abs: 0.9 10*3/uL (ref 0.7–4.0)
MCH: 38.2 pg — ABNORMAL HIGH (ref 26.0–34.0)
MCHC: 35.5 g/dL (ref 30.0–36.0)
MCV: 107.4 fL — ABNORMAL HIGH (ref 80.0–100.0)
Monocytes Absolute: 0.6 10*3/uL (ref 0.1–1.0)
Monocytes Relative: 10 %
Neutro Abs: 4.9 10*3/uL (ref 1.7–7.7)
Neutrophils Relative %: 75 %
Platelet Count: 216 10*3/uL (ref 150–400)
RBC: 3.8 MIL/uL — ABNORMAL LOW (ref 4.22–5.81)
RDW: 12.9 % (ref 11.5–15.5)
WBC Count: 6.6 10*3/uL (ref 4.0–10.5)
nRBC: 0 % (ref 0.0–0.2)

## 2022-01-02 LAB — CMP (CANCER CENTER ONLY)
ALT: 13 U/L (ref 0–44)
AST: 16 U/L (ref 15–41)
Albumin: 4.1 g/dL (ref 3.5–5.0)
Alkaline Phosphatase: 42 U/L (ref 38–126)
Anion gap: 6 (ref 5–15)
BUN: 30 mg/dL — ABNORMAL HIGH (ref 8–23)
CO2: 33 mmol/L — ABNORMAL HIGH (ref 22–32)
Calcium: 9.8 mg/dL (ref 8.9–10.3)
Chloride: 101 mmol/L (ref 98–111)
Creatinine: 1.23 mg/dL (ref 0.61–1.24)
GFR, Estimated: >60 mL/min (ref >60)
Glucose, Bld: 90 mg/dL (ref 70–99)
Potassium: 3.8 mmol/L (ref 3.5–5.1)
Sodium: 140 mmol/L (ref 135–145)
Total Bilirubin: 0.8 mg/dL (ref 0.3–1.2)
Total Protein: 7 g/dL (ref 6.5–8.1)

## 2022-01-02 NOTE — Telephone Encounter (Signed)
Pt was last seen by MMM in Oct. He was instructed to follow up in 30m He has not been seen since the Oct appt. Pt has a CPE scheduled with MMM on 6/13.  #30 sent on 6/5

## 2022-01-02 NOTE — Telephone Encounter (Signed)
Pt has been informed and understood to wait until his visit with MMM on 6/13. Advised that she would discuss recent labs and meds to make sure he is receiving the appropriate care.

## 2022-01-03 LAB — PROSTATE-SPECIFIC AG, SERUM (LABCORP): Prostate Specific Ag, Serum: 1.2 ng/mL (ref 0.0–4.0)

## 2022-01-03 LAB — TESTOSTERONE: Testosterone: 19 ng/dL — ABNORMAL LOW (ref 264–916)

## 2022-01-08 ENCOUNTER — Encounter: Payer: Self-pay | Admitting: Hematology and Oncology

## 2022-01-08 ENCOUNTER — Ambulatory Visit (INDEPENDENT_AMBULATORY_CARE_PROVIDER_SITE_OTHER): Payer: Medicare Other | Admitting: Nurse Practitioner

## 2022-01-08 ENCOUNTER — Telehealth: Payer: Self-pay | Admitting: Pharmacy Technician

## 2022-01-08 ENCOUNTER — Encounter: Payer: Self-pay | Admitting: Nurse Practitioner

## 2022-01-08 ENCOUNTER — Telehealth: Payer: Self-pay | Admitting: Pharmacist

## 2022-01-08 ENCOUNTER — Other Ambulatory Visit (HOSPITAL_COMMUNITY): Payer: Self-pay

## 2022-01-08 VITALS — BP 144/76 | HR 60 | Temp 97.7°F | Resp 20 | Ht 70.0 in | Wt 233.0 lb

## 2022-01-08 DIAGNOSIS — I10 Essential (primary) hypertension: Secondary | ICD-10-CM

## 2022-01-08 DIAGNOSIS — I480 Paroxysmal atrial fibrillation: Secondary | ICD-10-CM

## 2022-01-08 DIAGNOSIS — E782 Mixed hyperlipidemia: Secondary | ICD-10-CM | POA: Diagnosis not present

## 2022-01-08 DIAGNOSIS — Z6832 Body mass index (BMI) 32.0-32.9, adult: Secondary | ICD-10-CM

## 2022-01-08 DIAGNOSIS — Z0000 Encounter for general adult medical examination without abnormal findings: Secondary | ICD-10-CM

## 2022-01-08 DIAGNOSIS — Z0001 Encounter for general adult medical examination with abnormal findings: Secondary | ICD-10-CM | POA: Diagnosis not present

## 2022-01-08 DIAGNOSIS — F411 Generalized anxiety disorder: Secondary | ICD-10-CM

## 2022-01-08 DIAGNOSIS — C61 Malignant neoplasm of prostate: Secondary | ICD-10-CM

## 2022-01-08 DIAGNOSIS — D45 Polycythemia vera: Secondary | ICD-10-CM

## 2022-01-08 MED ORDER — PAROXETINE HCL 10 MG PO TABS: 5.0000 mg | ORAL_TABLET | Freq: Two times a day (BID) | ORAL | 1 refills | Status: DC

## 2022-01-08 MED ORDER — LISINOPRIL-HYDROCHLOROTHIAZIDE 20-12.5 MG PO TABS: 2.0000 | ORAL_TABLET | Freq: Every day | ORAL | 1 refills | Status: DC

## 2022-01-08 MED ORDER — ABIRATERONE ACETATE 250 MG PO TABS
1000.0000 mg | ORAL_TABLET | Freq: Every day | ORAL | 2 refills | Status: DC
  Filled 2022-01-08 – 2022-01-09 (×2): qty 120, 30d supply, fill #0
  Filled 2022-01-31: qty 120, 30d supply, fill #1
  Filled 2022-02-21: qty 120, 30d supply, fill #2

## 2022-01-08 MED ORDER — HYDROXYUREA 500 MG PO CAPS: 500.0000 mg | ORAL_CAPSULE | Freq: Two times a day (BID) | ORAL | 1 refills | Status: DC

## 2022-01-08 MED ORDER — PREDNISONE 5 MG PO TABS
5.0000 mg | ORAL_TABLET | Freq: Every day | ORAL | 1 refills | Status: DC
  Filled 2022-01-08 – 2022-01-09 (×2): qty 90, 90d supply, fill #0
  Filled 2022-03-26: qty 90, 90d supply, fill #1

## 2022-01-08 NOTE — Patient Instructions (Signed)
Exercising to Stay Healthy To become healthy and stay healthy, it is recommended that you do moderate-intensity and vigorous-intensity exercise. You can tell that you are exercising at a moderate intensity if your heart starts beating faster and you start breathing faster but can still hold a conversation. You can tell that you are exercising at a vigorous intensity if you are breathing much harder and faster and cannot hold a conversation while exercising. How can exercise benefit me? Exercising regularly is important. It has many health benefits, such as: Improving overall fitness, flexibility, and endurance. Increasing bone density. Helping with weight control. Decreasing body fat. Increasing muscle strength and endurance. Reducing stress and tension, anxiety, depression, or anger. Improving overall health. What guidelines should I follow while exercising? Before you start a new exercise program, talk with your health care provider. Do not exercise so much that you hurt yourself, feel dizzy, or get very short of breath. Wear comfortable clothes and wear shoes with good support. Drink plenty of water while you exercise to prevent dehydration or heat stroke. Work out until your breathing and your heartbeat get faster (moderate intensity). How often should I exercise? Choose an activity that you enjoy, and set realistic goals. Your health care provider can help you make an activity plan that is individually designed and works best for you. Exercise regularly as told by your health care provider. This may include: Doing strength training two times a week, such as: Lifting weights. Using resistance bands. Push-ups. Sit-ups. Yoga. Doing a certain intensity of exercise for a given amount of time. Choose from these options: A total of 150 minutes of moderate-intensity exercise every week. A total of 75 minutes of vigorous-intensity exercise every week. A mix of moderate-intensity and  vigorous-intensity exercise every week. Children, pregnant women, people who have not exercised regularly, people who are overweight, and older adults may need to talk with a health care provider about what activities are safe to perform. If you have a medical condition, be sure to talk with your health care provider before you start a new exercise program. What are some exercise ideas? Moderate-intensity exercise ideas include: Walking 1 mile (1.6 km) in about 15 minutes. Biking. Hiking. Golfing. Dancing. Water aerobics. Vigorous-intensity exercise ideas include: Walking 4.5 miles (7.2 km) or more in about 1 hour. Jogging or running 5 miles (8 km) in about 1 hour. Biking 10 miles (16.1 km) or more in about 1 hour. Lap swimming. Roller-skating or in-line skating. Cross-country skiing. Vigorous competitive sports, such as football, basketball, and soccer. Jumping rope. Aerobic dancing. What are some everyday activities that can help me get exercise? Yard work, such as: Pushing a lawn mower. Raking and bagging leaves. Washing your car. Pushing a stroller. Shoveling snow. Gardening. Washing windows or floors. How can I be more active in my day-to-day activities? Use stairs instead of an elevator. Take a walk during your lunch break. If you drive, park your car farther away from your work or school. If you take public transportation, get off one stop early and walk the rest of the way. Stand up or walk around during all of your indoor phone calls. Get up, stretch, and walk around every 30 minutes throughout the day. Enjoy exercise with a friend. Support to continue exercising will help you keep a regular routine of activity. Where to find more information You can find more information about exercising to stay healthy from: U.S. Department of Health and Human Services: www.hhs.gov Centers for Disease Control and Prevention (  CDC): www.cdc.gov Summary Exercising regularly is  important. It will improve your overall fitness, flexibility, and endurance. Regular exercise will also improve your overall health. It can help you control your weight, reduce stress, and improve your bone density. Do not exercise so much that you hurt yourself, feel dizzy, or get very short of breath. Before you start a new exercise program, talk with your health care provider. This information is not intended to replace advice given to you by your health care provider. Make sure you discuss any questions you have with your health care provider. Document Revised: 11/10/2020 Document Reviewed: 11/10/2020 Elsevier Patient Education  2023 [employer]  

## 2022-01-08 NOTE — Progress Notes (Signed)
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 Lorenso Courier  10/11/2020: started phebotomy q 2 weeks and hydroxyurea 554m BID 11/15/2020: WBC 9.0, Hgb 14.9, HCT 44.5%, Plt 384. Holding phlebotomies, patient at target.  02/21/2021: WBC 5.8, Hgb 15.1, Hct 42.8, Plt 208 08/17/2021: WBC 6.6, Hgb 14.3, MCV 110, Plt 228  #Hx of Prostate Cancer 2006: robotic resection of prostate.  11/16/2019: PSA 0.21([facility])  08/08/2020: PSA 0.36 ([facility]  11/28/2021: NM PSMA scan showed widespread skeletal metastatic disease involving calvarium, ribs, spine, pelvis and bilateral proximal femora 12/19/2021: first dose of leuprolide 22.5 mg IM  Interval History:  Justin SEVILLANO81y.o. male with medical history significant for polycythemia vera who presents for a follow up visit. The patient's last visit was on 12/04/2021. In the interim since the last visit the patient has started Lupron therapy.  On exam today Justin Gibbs reports he has been well in the interim since her last visit.  He notes his energy levels are "okay".  He also notices that he is having some occasional hot flashes and sweats but these are manageable.  He reports he is not having any bone pain, back pain, or pain in his knees.  Overall he is tolerating  the hydroxyurea therapy well without any stomach upset or diarrhea.  He notes he has not had any weakness, numbness, chest pain, shortness of breath, or lower extremity swelling.  A full 10 point ROS is listed below.  The bulk of our discussion focused on adding on abiraterone therapy and what to expect moving forward.  The patient voices understanding of the plan was willing and able to proceed with abiraterone.   MEDICAL HISTORY:  Past Medical History:  Diagnosis Date   Anxiety    Arthritis    Atrial fibrillation ([place] 06/2020   Cancer ([place]    prostate / removed 2006   Depression    Dysrhythmia    High triglycerides    Hypertension    Polycythemia vera, acquired ([place]     SURGICAL HISTORY: Past Surgical History:  Procedure Laterality Date   BIOPSY  08/27/2021   Procedure: BIOPSY;  Surgeon: CEloise Harman DO;  [facility];  Service: Endoscopy;;   COLONOSCOPY WITH PROPOFOL N/A 08/27/2021   Procedure: COLONOSCOPY WITH PROPOFOL;  Surgeon: CEloise Harman DO;  [facility];  Service: Endoscopy;  Laterality: N/A;  8:00am   HERNIA REPAIR Bilateral    x 2    POLYPECTOMY  08/27/2021   Procedure: POLYPECTOMY;  Surgeon: CEloise Harman DO;  [facility];  Service: Endoscopy;;   PROSTATECTOMY      SOCIAL HISTORY: Social History   Socioeconomic History   Marital status: Married    Spouse name: Dawn   Number of children: 3   Years of education: Not on file   Highest education level: Not on file  Occupational  History   Occupation: retired     Comment: 2007  Tobacco Use   Smoking status: Former    Packs/day: 1.00    Years: 23.00    Total pack years: 23.00    Types: Cigarettes, Cigars    Quit date: 06/12/1982    Years since quitting: 39.6   Smokeless tobacco: Never   Tobacco comments:    I was a light smoker  Vaping Use   Vaping Use: Never used  Substance and Sexual Activity   Alcohol use: Never   Drug use: Never   Sexual  activity: Not Currently    Comment: Prostate removal  Nov. 15 2006  Other Topics Concern   Not on file  Social History Narrative   Lives with wife and son. He has a basement with a wood stove.   Social Determinants of Health   Financial Resource Strain: Low Risk  (06/12/2021)   Overall Financial Resource Strain (CARDIA)    Difficulty of Paying Living Expenses: Not hard at all  Food Insecurity: No Food Insecurity (06/12/2021)   Hunger Vital Sign    Worried About Running Out of Food in the Last Year: Never true    Ran Out of Food in the Last Year: Never true  Transportation Needs: No Transportation Needs (06/12/2021)   PRAPARE - Hydrologist (Medical): No    Lack of Transportation (Non-Medical): No  Physical Activity: Sufficiently Active (06/12/2021)   Exercise Vital Sign    Days of Exercise per Week: 5 days    Minutes of Exercise per Session: 30 min  Stress: No Stress Concern Present (06/12/2021)   [place]    Feeling of Stress : Not at all  Social Connections: Moderately Integrated (06/12/2021)   Social Connection and Isolation Panel [NHANES]    Frequency of Communication with Friends and Family: More than three times a week    Frequency of Social Gatherings with Friends and Family: More than three times a week    Attends Religious Services: More than 4 times per year    Active Member of [employer] or Organizations: No    Attends Archivist Meetings: Never    Marital Status: Married  Human resources officer Violence: Not At Risk (06/12/2021)   Humiliation, Afraid, Rape, and Kick questionnaire    Fear of Current or Ex-Partner: No    Emotionally Abused: No    Physically Abused: No    Sexually Abused: No    FAMILY HISTORY: Family History  Problem Relation Age of Onset   Alzheimer's disease Mother    Alcohol abuse Father    Cancer Father        leukemia   Cirrhosis Father     Stomach cancer Father    Stroke Brother    Diabetes Daughter    Cancer Maternal Grandmother    Cancer Paternal Grandfather    Prostate cancer Brother    Brain cancer Brother    Thyroid cancer Sister     ALLERGIES:  has No Known Allergies.  MEDICATIONS:  Current Outpatient Medications  Medication Sig Dispense Refill   abiraterone acetate (ZYTIGA) 250 MG tablet Take 4 tablets (1,000 mg total) by mouth daily. Take on an empty stomach 1 hour before or 2 hours after a meal 120 tablet 2   predniSONE (DELTASONE) 5 MG tablet Take 1 tablet (5 mg total) by mouth daily with breakfast. 90 tablet 1   apixaban (ELIQUIS) 5 MG TABS  tablet Take 1 tablet (5 mg total) by mouth 2 (two) times daily. 60 tablet 11   hydroxyurea (HYDREA) 500 MG capsule Take 1 capsule (500 mg total) by mouth 2 (two) times daily. May take with food to minimize GI side effects. 180 capsule 1   lisinopril-hydrochlorothiazide (ZESTORETIC) 20-12.5 MG tablet Take 2 tablets by mouth daily. 180 tablet 1   metoprolol tartrate (LOPRESSOR) 25 MG tablet Take 1 tablet (25 mg total) by mouth 2 (two) times daily. (NEEDS TO BE SEEN BEFORE NEXT REFILL) 30 tablet 0   Multiple Vitamin (MULTIVITAMIN) capsule Take 1 capsule by mouth daily.     PARoxetine (PAXIL) 10 MG tablet Take 0.5 tablets (5 mg total) by mouth 2 (two) times daily. 90 tablet 1   No current facility-administered medications for this visit.    REVIEW OF SYSTEMS:   Constitutional: ( - ) fevers, ( - )  chills , ( - ) night sweats Eyes: ( - ) blurriness of vision, ( - ) double vision, ( - ) watery eyes Ears, nose, mouth, throat, and face: ( - ) mucositis, ( - ) sore throat Respiratory: ( - ) cough, ( - ) dyspnea, ( - ) wheezes Cardiovascular: ( - ) palpitation, ( - ) chest discomfort, ( - ) lower extremity swelling Gastrointestinal:  ( - ) nausea, ( - ) heartburn, ( - ) change in bowel habits Skin: ( - ) abnormal skin rashes Lymphatics: ( - ) new lymphadenopathy, ( - ) easy  bruising Neurological: ( - ) numbness, ( - ) tingling, ( - ) new weaknesses Behavioral/Psych: ( - ) mood change, ( - ) new changes  All other systems were reviewed with the patient and are negative.  PHYSICAL EXAMINATION:  Vitals:   01/02/22 1045  BP: (!) 163/84  Pulse: 88  Resp: 17  Temp: 97.9 F (36.6 C)  SpO2: 98%   Filed Weights   01/02/22 1045  Weight: 231 lb 12.8 oz (105.1 kg)    GENERAL: well appearing elderly Caucasian male alert, no distress and comfortable SKIN: skin color, texture, turgor are normal, no rashes or significant lesions EYES: conjunctiva are pink and non-injected, sclera clear LUNGS: clear to auscultation and percussion with normal breathing effort HEART: regular rate & rhythm and no murmurs and no lower extremity edema Musculoskeletal: no cyanosis of digits and no clubbing  PSYCH: alert & oriented x 3, fluent speech NEURO: no focal motor/sensory deficits  LABORATORY DATA:  I have reviewed the data as listed    Latest Ref Rng & Units 01/02/2022    9:45 AM 12/04/2021    9:50 AM 11/19/2021   12:42 PM  CBC  WBC 4.0 - 10.5 K/uL 6.6  7.6  6.1   Hemoglobin 13.0 - 17.0 g/dL 14.5  15.1  14.3   Hematocrit 39.0 - 52.0 % 40.8  43.6  41.0   Platelets 150 - 400 K/uL 216  263  240        Latest Ref Rng & Units 01/02/2022    9:45 AM 12/04/2021    9:50 AM 11/19/2021   12:42 PM  CMP  Glucose 70 - 99 mg/dL 90  100  155   BUN 8 - 23 mg/dL 30  34  25   Creatinine 0.61 - 1.24 mg/dL 1.23  1.43  1.18   Sodium 135 - 145 mmol/L 140  141  139   Potassium 3.5 - 5.1 mmol/L 3.8  4.2  3.9   Chloride 98 - 111  mmol/L 101  103  100   CO2 22 - 32 mmol/L 33  30  30   Calcium 8.9 - 10.3 mg/dL 9.8  9.2  8.8   Total Protein 6.5 - 8.1 g/dL 7.0  7.3  6.7   Total Bilirubin 0.3 - 1.2 mg/dL 0.8  1.1  0.6   Alkaline Phos 38 - 126 U/L 42  44  46   AST 15 - 41 U/L _0 ALT 0 - 44 U/L _1 No results found for: "MPROTEIN" No results found for: "KPAFRELGTCHN",  "LAMBDASER", "KAPLAMBRATIO"  Pathology:    RADIOGRAPHIC STUDIES: No results found.  ASSESSMENT & PLAN Justin Gibbs 81 y.o. male with medical history significant for polycythemia vera who presents for a follow up visit.   After review of the labs, the records, and discussion with the patient the findings most consistent with a polycythemia vera which is JAK2 V617 F positive.  These results were confirmed with a bone marrow biopsy.  Given the patient's age greater than 28 he is considered a high risk polycythemia vera and therefore would require cytoreductive therapy with hydroxyurea 500 mg twice daily in addition to every 2 week phlebotomies.  Goal hematocrit for this patient is less than 45%.  Additionally he is on Eliquis therapy which will serve as anticoagulation for thromboprophylaxis.  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  Patient at goal HCT goal of <45% --Today Hgb 14.5 with  HCT 40.8 --patient currently at goal HCT, will hold further phlebotomies unless needed.  --holding ASA 42m PO daily as the patient is on eliquis.  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  --11/27/2021: widespread metastatic disease noted on PSMA scan.  -- underwent radiation therapy, completed this on 01/31/2021. 12/19/2021: first dose of leuprolide 22.5 mg IM Plan: -- Patient completed bicalutamide 50 mg PO daily x 4 weeks -- First dose of lupron 22.516mafter 2 weeks of bicalutamide therapy on 12/19/2021.  We will plan for next dose in 3 months time. --no indication for zometa therapy at this time --will plan to start zytiga 1000 mg PO daily with 5 mg PO prednisone PO daily.  Prescription placed today. --RTC in 1  month to re-evaluate.   No orders of the defined types were placed in this encounter.   All questions were answered. The patient knows to call the clinic with any problems, questions or concerns.  A total of more than 30 minutes were spent on this encounter and over half of that time was spent on counseling and coordination of care as outlined above.   JoLedell PeoplesMD Department of Hematology/Oncology CoAllouezt WeSunrise Ambulatory Surgical Centerhone: [phone]: [phone]: [email or web address]  01/08/2022 2:34 PM

## 2022-01-08 NOTE — Telephone Encounter (Signed)
Oral Oncology Patient Advocate Encounter   Received notification that prior authorization for Abiraterone [facility]) is required.   PA submitted on 01/08/2022 Key [id number] Status is pending     Lady Deutscher, CPhT-Adv Pharmacy Patient Advocate Specialist [place] Patient Advocate Team Direct Number: [phone]  Fax: [phone]

## 2022-01-08 NOTE — Addendum Note (Signed)
Addended by: Chevis Pretty on: 01/08/2022 11:23 AM   Modules accepted: Orders

## 2022-01-08 NOTE — Telephone Encounter (Signed)
Oral Oncology Pharmacist Encounter  Received new prescription for Zytiga (abiraterone acetate) for the treatment of metastatic castration-sensitive prostate cancer in conjunction with prednisone and Lupron, planned duration until disease progression or unacceptable drug toxicity.  CMP and CBC w/ Diff from 01/02/22 assessed, no baseline dose adjustments required.  Current medication list in Epic reviewed, no relevant/significant DDIs with Zytiga identified.  Evaluated chart and no patient barriers to medication adherence noted.   Prescription has been e-scribed to the [facility] for benefits analysis and approval.  Oral Oncology Clinic will continue to follow for insurance authorization, copayment issues, initial counseling and start date.  Leron Croak, PharmD, BCPS Hematology/Oncology Clinical Pharmacist [facility] and [place] [phone] 01/08/2022 2:56 PM

## 2022-01-08 NOTE — Progress Notes (Signed)
Subjective:    Patient ID: Justin Gibbs, male    DOB: 1941-04-04, 81 y.o.   MRN: [id number]   Chief Complaint: Annual Exam    HPI:  Justin Gibbs is a 81 y.o. who identifies as a male who was assigned male at birth.   Social history: Lives with: wife Work history: retired   Scientist, forensic in today for follow up of the following chronic medical issues:  1. Annual physical exam  2. Essential hypertension No c/o chest pain, sob or headache. Does not check blood pressure at home. BP Readings from Last 3 Encounters:  01/08/22 (!) 144/76  01/02/22 (!) 163/84  12/19/21 (!) 155/87     3. Mixed hyperlipidemia Does try  to watch diet. Does no dedicated exercise. Lab Results  Component Value Date   CHOL 166 05/22/2021   HDL 41 05/22/2021   LDLCALC 95 05/22/2021   TRIG 175 (H) 05/22/2021   CHOLHDL 4.0 05/22/2021     4. Paroxysmal atrial fibrillation (HCC) No c/o palpitations pr heart racing. Is on eliquis with no bleeding issues.  5. Polycythemia vera ([place]) No symptoms. Say she is dong well as far as he knows. Last saw hematology on 01/02/22. Lab Results  Component Value Date   WBC 6.6 01/02/2022   HGB 14.5 01/02/2022   HCT 40.8 01/02/2022   MCV 107.4 (H) 01/02/2022   PLT 216 01/02/2022     6. GAD (generalized anxiety disorder) Is onpaxil and says he is doing well.    01/08/2022   10:50 AM 05/22/2021   10:48 AM 02/12/2021   10:32 AM 08/10/2020   10:54 AM  GAD 7 : Generalized Anxiety Score  Nervous, Anxious, on Edge 0 0 0 0  Control/stop worrying 0 0 0 0  Worry too much - different things 0 0 0 0  Trouble relaxing 0 0 0 0  Restless 0 0 0 0  Easily annoyed or irritable 0 0 0 0  Afraid - awful might happen 0 0 0 0  Total GAD 7 Score 0 0 0 0  Anxiety Difficulty Not difficult at all Not difficult at all Not difficult at all Not difficult at all      7. 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  New complaints: None today  No Known Allergies Outpatient Encounter Medications as of 01/08/2022  Medication Sig   apixaban (ELIQUIS) 5 MG TABS tablet Take 1 tablet (5 mg total) by mouth 2 (two) times daily.   bicalutamide (CASODEX) 50 MG tablet Take 1 tablet (50 mg total) by mouth daily.   hydroxyurea (HYDREA) 500 MG capsule Take 1 capsule (500 mg total) by mouth 2 (two) times daily. May take with food to minimize GI side effects.   lisinopril-hydrochlorothiazide (ZESTORETIC) 20-12.5 MG tablet Take 2 tablets by mouth daily. (Patient taking differently: Take 1 tablet by mouth 2 (two) times daily.)   metoprolol tartrate (LOPRESSOR) 25 MG tablet Take 1 tablet (25 mg total) by mouth 2 (two) times daily. (NEEDS TO BE SEEN BEFORE NEXT REFILL)   Multiple Vitamin (MULTIVITAMIN)  capsule Take 1 capsule by mouth daily.   PARoxetine (PAXIL) 10 MG tablet Take 0.5 tablets (5 mg total) by mouth 2 (two) times daily.   Phenylephrine HCl (EQL NASAL DECONGESTANT NA) Place 1 spray into the nose daily as needed (congestion).   No facility-administered encounter medications on file as of 01/08/2022.    Past Surgical History:  Procedure Laterality Date   BIOPSY  08/27/2021   Procedure: BIOPSY;  Surgeon: Eloise Harman, DO;  [facility];  Service: Endoscopy;;   COLONOSCOPY WITH PROPOFOL N/A 08/27/2021   Procedure: COLONOSCOPY WITH PROPOFOL;  Surgeon: Eloise Harman, DO;  [facility];  Service: Endoscopy;  Laterality: N/A;  8:00am   HERNIA REPAIR Bilateral    x 2    POLYPECTOMY  08/27/2021   Procedure: POLYPECTOMY;  Surgeon: Eloise Harman,  DO;  [facility];  Service: Endoscopy;;   PROSTATECTOMY      Family History  Problem Relation Age of Onset   Alzheimer's disease Mother    Alcohol abuse Father    Cancer Father        leukemia   Cirrhosis Father    Stomach cancer Father    Stroke Brother    Diabetes Daughter    Cancer Maternal Grandmother    Cancer Paternal Grandfather    Prostate cancer Brother    Brain cancer Brother    Thyroid cancer Sister       Controlled substance contract: n/a     Review of Systems  Constitutional:  Negative for diaphoresis.  Eyes:  Negative for pain.  Respiratory:  Negative for shortness of breath.   Cardiovascular:  Negative for chest pain, palpitations and leg swelling.  Gastrointestinal:  Negative for abdominal pain.  Endocrine: Negative for polydipsia.  Skin:  Negative for rash.  Neurological:  Negative for dizziness, weakness and headaches.  Hematological:  Does not bruise/bleed easily.  All other systems reviewed and are negative.      Objective:   Physical Exam Vitals and nursing note reviewed.  Constitutional:      Appearance: Normal appearance. He is well-developed.  HENT:     Head: Normocephalic.     Nose: Nose normal.     Mouth/Throat:     Mouth: Mucous membranes are moist.     Pharynx: Oropharynx is clear.  Eyes:     Pupils: Pupils are equal, round, and reactive to light.  Neck:     Thyroid: No thyroid mass or thyromegaly.     Vascular: No carotid bruit or JVD.     Trachea: Phonation normal.  Cardiovascular:     Rate and Rhythm: Normal rate. Rhythm irregular.  Pulmonary:     Effort: Pulmonary effort is normal. No respiratory distress.     Breath sounds: Normal breath sounds.  Abdominal:     General: Bowel sounds are normal.     Palpations: Abdomen is soft.     Tenderness: There is no abdominal tenderness.  Musculoskeletal:        General: Normal range of motion.     Cervical back: Normal range of motion and neck supple.   Lymphadenopathy:     Cervical: No cervical adenopathy.  Skin:    General: Skin is warm and dry.  Neurological:     Mental Status: He is alert and oriented to person, place, and time.  Psychiatric:        Behavior: Behavior normal.        Thought Content: Thought content  normal.        Judgment: Judgment normal.    BP (!) 144/76   Pulse 60   Temp 97.7 F (36.5 C) (Temporal)   Resp 20   Ht '5\' 10"'$  (1.778 m)   Wt 233 lb (105.7 kg)   SpO2 98%   BMI 33.43 kg/m          Assessment & Plan:   Justin Gibbs comes in today with chief complaint of Annual Exam   Diagnosis and orders addressed:  1. Annual physical exam  No ekg needed today No chest xray needed- just recently had PET scan  2. Essential hypertension Low sodium diet - lisinopril-hydrochlorothiazide (ZESTORETIC) 20-12.5 MG tablet; Take 2 tablets by mouth daily.  Dispense: 180 tablet; Refill: 1  3. Mixed hyperlipidemia Low fat diet  4. Paroxysmal atrial fibrillation (HCC) Report any palpitations or heart racing  5. Polycythemia vera ([place]) Keep follow up with hematology  6. GAD (generalized anxiety disorder) Stress management - PARoxetine (PAXIL) 10 MG tablet; Take 0.5 tablets (5 mg total) by mouth 2 (two) times daily.  Dispense: 90 tablet; Refill: 1  7. 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 Primary prostate cancer with mets to bone Keep follow up with oncology    Labs pending Health Maintenance reviewed Diet and exercise encouraged  Follow up plan: 6 months   [place], FNP

## 2022-01-08 NOTE — Telephone Encounter (Signed)
Oral Oncology Patient Advocate Encounter  Prior Authorization for Abiraterone Fabio Asa) has been approved.    PA# [id number] Effective dates: 01/08/2022 through 07/28/2022  Patients co-pay is $100.    Lady Deutscher, CPhT-Adv Pharmacy Patient Advocate Specialist [place] Patient Advocate Team Direct Number: [phone]  Fax: [phone]

## 2022-01-09 ENCOUNTER — Encounter: Payer: Self-pay | Admitting: Pharmacist

## 2022-01-09 ENCOUNTER — Other Ambulatory Visit (HOSPITAL_COMMUNITY): Payer: Self-pay

## 2022-01-09 NOTE — Telephone Encounter (Signed)
Oral Chemotherapy Pharmacist Encounter  I spoke with patient for overview of: Zytiga (abiraterone acetate) for the treatment of metastatic, castration-sensitive prostate cancer in conjunction with prednisone, planned duration until disease progression or unacceptable toxicity.   Counseled patient on administration, dosing, side effects, monitoring, drug-food interactions, safe handling, storage, and disposal.  Patient will take Zytiga '250mg'$  tablets, 4 tablets ('1000mg'$ ) by mouth once daily on an empty stomach, 1 hour before or 2 hours after a meal.  Patient states he will take his Zytiga in the morning and will wait at least 1 hour before eating.  Patient will take prednisone '5mg'$  tablet, 1 tablet by mouth one daily with breakfast.  Zytiga start date: 01/11/22  Adverse effects include but are not limited to: peripheral edema, GI upset, hypertension, hot flashes, fatigue, and arthralgias.    Reviewed with patient importance of keeping a medication schedule and plan for any missed doses. No barriers to medication adherence identified.  Medication reconciliation performed and medication/allergy list updated.  Insurance authorization for Fabio Asa has been obtained. Test claim at the pharmacy revealed copayment $100 for 1st fill of Zytiga. Patient will pick this up from the [facility] on 01/10/22.  Patient informed the pharmacy will reach out 5-7 days prior to needing next fill of Zytiga to coordinate continued medication acquisition to prevent break in therapy.  All questions answered.  Mr. Mangold voiced understanding and appreciation.   Medication education handout placed in mail for patient. Patient knows to call the office with questions or concerns. Oral Chemotherapy Clinic phone number provided to patient.   Leron Croak, PharmD, BCPS Hematology/Oncology Clinical Pharmacist [facility] and [place] [phone] 01/09/2022  10:33 AM

## 2022-01-22 ENCOUNTER — Other Ambulatory Visit (HOSPITAL_COMMUNITY): Payer: Self-pay

## 2022-01-23 ENCOUNTER — Telehealth: Payer: Self-pay | Admitting: Pharmacy Technician

## 2022-01-23 ENCOUNTER — Other Ambulatory Visit (HOSPITAL_COMMUNITY): Payer: Self-pay

## 2022-01-23 ENCOUNTER — Encounter: Payer: Self-pay | Admitting: Hematology and Oncology

## 2022-01-23 NOTE — Telephone Encounter (Signed)
Oral Oncology Patient Advocate Encounter   Was successful in securing patient a $3,500 grant from [employer] [place] (PAF) to provide copayment coverage for Abiraterone (Zytiga).  This will keep the out of pocket expense at $0.     I have spoken with the patient.    The billing information is as follows and has been shared with [facility]  RxBin: [id number] PCN:  [id number] Member ID: [id number] Group ID: [id number] Dates of Eligibility: 07/27/2021 through 01/23/2023   Lady Deutscher, [place] Patient Advocate Specialist [place] Patient Advocate Team Direct Number: [phone]  Fax: [phone]

## 2022-01-30 ENCOUNTER — Other Ambulatory Visit (HOSPITAL_COMMUNITY): Payer: Self-pay

## 2022-01-31 ENCOUNTER — Other Ambulatory Visit (HOSPITAL_COMMUNITY): Payer: Self-pay

## 2022-02-01 ENCOUNTER — Other Ambulatory Visit (HOSPITAL_COMMUNITY): Payer: Self-pay

## 2022-02-01 ENCOUNTER — Other Ambulatory Visit: Payer: Self-pay

## 2022-02-01 ENCOUNTER — Ambulatory Visit (INDEPENDENT_AMBULATORY_CARE_PROVIDER_SITE_OTHER): Payer: Medicare Other

## 2022-02-01 ENCOUNTER — Ambulatory Visit: Payer: Medicare Other | Admitting: Nurse Practitioner

## 2022-02-01 VITALS — Wt 222.0 lb

## 2022-02-01 DIAGNOSIS — Z0000 Encounter for general adult medical examination without abnormal findings: Secondary | ICD-10-CM

## 2022-02-01 DIAGNOSIS — I480 Paroxysmal atrial fibrillation: Secondary | ICD-10-CM

## 2022-02-01 MED ORDER — METOPROLOL TARTRATE 25 MG PO TABS: 25.0000 mg | ORAL_TABLET | Freq: Two times a day (BID) | ORAL | 0 refills | Status: DC

## 2022-02-01 NOTE — Progress Notes (Signed)
Subjective:   Justin Gibbs is a 81 y.o. male who presents for Medicare Annual/Subsequent preventive examination.  Virtual Visit via Telephone Note  I connected with  Justin Gibbs on 02/01/22 at  2:45 PM EDT by telephone and verified that I am speaking with the correct person using two identifiers.  Location: Patient: Home Provider: [facility] Persons participating in the virtual visit: patient/Nurse Health Advisor   I discussed the limitations, risks, security and privacy concerns of performing an evaluation and management service by telephone and the availability of in person appointments. The patient expressed understanding and agreed to proceed.  Interactive audio and video telecommunications were attempted between this nurse and patient, however failed, due to patient having technical difficulties OR patient did not have access to video capability.  We continued and completed visit with audio only.  Some vital signs may be absent or patient reported.   Justin Urich E Zarie Kosiba, LPN   Review of Systems     Cardiac Risk Factors include: advanced age (>37mn, >>53women);dyslipidemia;hypertension;male gender;sedentary lifestyle;obesity (BMI >30kg/m2);Other (see comment), Risk factor comments: A.Fib, polycythemia vera, prostate cancer     Objective:    Today's Vitals   02/01/22 1452  Weight: 222 lb (100.7 kg)   Body mass index is 31.85 kg/m.     02/01/2022    3:08 PM 08/27/2021    6:55 AM 08/23/2021   10:29 AM 08/21/2021   10:09 AM 03/19/2021   10:37 AM 01/31/2021    2:59 PM 11/28/2020    8:37 AM  Advanced Directives  Does Patient Have a Medical Advance Directive? No No No No Yes Yes No  Type of Advance Directive     Living [place]   Copy of [place] Chart?      No - copy requested   Would patient like information on creating a medical advance directive? No - Patient declined No - Patient declined No - Patient declined    No -  Patient declined    Current Medications (verified) Outpatient Encounter Medications as of 02/01/2022  Medication Sig   abiraterone acetate (ZYTIGA) 250 MG tablet Take 4 tablets (1,000 mg total) by mouth daily. Take on an empty stomach 1 hour before or 2 hours after a meal   apixaban (ELIQUIS) 5 MG TABS tablet Take 1 tablet (5 mg total) by mouth 2 (two) times daily.   hydroxyurea (HYDREA) 500 MG capsule Take 1 capsule (500 mg total) by mouth 2 (two) times daily. May take with food to minimize GI side effects.   lisinopril-hydrochlorothiazide (ZESTORETIC) 20-12.5 MG tablet Take 2 tablets by mouth daily.   metoprolol tartrate (LOPRESSOR) 25 MG tablet Take 1 tablet (25 mg total) by mouth 2 (two) times daily. (NEEDS TO BE SEEN BEFORE NEXT REFILL)   Multiple Vitamin (MULTIVITAMIN) capsule Take 1 capsule by mouth daily.   PARoxetine (PAXIL) 10 MG tablet Take 0.5 tablets (5 mg total) by mouth 2 (two) times daily.   predniSONE (DELTASONE) 5 MG tablet Take 1 tablet (5 mg total) by mouth daily with breakfast.   No facility-administered encounter medications on file as of 02/01/2022.    Allergies (verified) Patient has no known allergies.   History: Past Medical History:  Diagnosis Date   Anxiety    Arthritis    [place]   [facility]cer ([facility]    prostate / removed 2006   Depression    Dysrhythmia    High triglycerides  Hypertension    Polycythemia vera, acquired [facility])    Past Surgical History:  Procedure Laterality Date   BIOPSY  08/27/2021   Procedure: BIOPSY;  Surgeon: Eloise Harman, DO;  [facility];  Service: Endoscopy;;   COLONOSCOPY WITH PROPOFOL N/A 08/27/2021   Procedure: COLONOSCOPY WITH PROPOFOL;  Surgeon: Eloise Harman, DO;  [facility];  Service: Endoscopy;  Laterality: N/A;  8:00am   HERNIA REPAIR Bilateral    x 2    POLYPECTOMY  08/27/2021   Procedure: POLYPECTOMY;  Surgeon: Eloise Harman, DO;  [facility];   Service: Endoscopy;;   PROSTATECTOMY     Family History  Problem Relation Age of Onset   Alzheimer's disease Mother    Alcohol abuse Father    Cancer Father        leukemia   Cirrhosis Father    Stomach cancer Father    Stroke Brother    Diabetes Daughter    Cancer Maternal Grandmother    Cancer Paternal Grandfather    Prostate cancer Brother    Brain cancer Brother    Thyroid cancer Sister    Social History   Socioeconomic History   Marital status: Married    Spouse name: Justin Gibbs   Number of children: 3   Years of education: Not on file   Highest education level: Not on file  Occupational History   Occupation: retired     Comment: 2007  Tobacco Use   Smoking status: Former    Packs/day: 1.00    Years: 23.00    Total pack years: 23.00    Types: Cigarettes, Cigars    Quit date: 06/12/1982    Years since quitting: 39.6   Smokeless tobacco: Never   Tobacco comments:    I was a light smoker  Vaping Use   Vaping Use: Never used  Substance and Sexual Activity   Alcohol use: Never   Drug use: Never   Sexual activity: Not Currently    Comment: Prostate removal  Nov. 15 2006  Other Topics Concern   Not on file  Social History Narrative   Lives with wife and son. He has a basement with a wood stove.   Social Determinants of Health   Financial Resource Strain: Low Risk  (02/01/2022)   Overall Financial Resource Strain (CARDIA)    Difficulty of Paying Living Expenses: Not hard at all  Food Insecurity: No Food Insecurity (02/01/2022)   Hunger Vital Sign    Worried About Running Out of Food in the Last Year: Never true    Ran Out of Food in the Last Year: Never true  Transportation Needs: No Transportation Needs (02/01/2022)   PRAPARE - Hydrologist (Medical): No    Lack of Transportation (Non-Medical): No  Physical Activity: Insufficiently Active (02/01/2022)   Exercise Vital Sign    Days of Exercise per Week: 7 days    Minutes of Exercise per  Session: 20 min  Stress: No Stress Concern Present (02/01/2022)   [place]    Feeling of Stress : Not at all  Social Connections: Moderately Isolated (02/01/2022)   Social Connection and Isolation Panel [NHANES]    Frequency of Communication with Friends and Family: More than three times a week    Frequency of Social Gatherings with Friends and Family: More than three times a week    Attends Religious Services: Never  Active Member of Clubs or Organizations: No    Attends Archivist Meetings: Never    Marital Status: Married    Tobacco Counseling Counseling given: Not Answered Tobacco comments: I was a light smoker   Clinical Intake:  Pre-visit preparation completed: Yes  Pain : No/denies pain     BMI - recorded: 31.85 Nutritional Status: BMI > 30  Obese Nutritional Risks: None Diabetes: No  How often do you need to have someone help you when you read instructions, pamphlets, or other written materials from your doctor or pharmacy?: 1 - Never  Diabetic? no  Interpreter Needed?: No  Information entered by :: Nilesh Stegall, LPN   Activities of Daily Living    02/01/2022    2:57 PM 08/23/2021   10:32 AM  In your present state of health, do you have any difficulty performing the following activities:  Hearing? 1   Vision? 0   Difficulty concentrating or making decisions? 0   Walking or climbing stairs? 0   Dressing or bathing? 0   Doing errands, shopping? 0 0  Preparing Food and eating ? N   Using the Toilet? N   In the past six months, have you accidently leaked urine? Y   Comment wears a pad for protection - due to prostate cancer   Do you have problems with loss of bowel control? N   Managing your Medications? N   Managing your Finances? N   Housekeeping or managing your Housekeeping? N     Patient Care Team: Chevis Pretty, FNP as PCP - General (Family Medicine) Minus Breeding, MD as PCP - Cardiology (Cardiology) Tyler Pita, MD as Consulting Physician (Radiation Oncology) Orson Slick, MD as Consulting Physician (Hematology and Oncology) Delice Bison Charlestine Massed, NP as Nurse Practitioner (Hematology and Oncology) Harmon Pier, RN as Registered Nurse  Indicate any recent Medical Services you may have received from other than [facility] providers in the past year (date may be approximate).     Assessment:   This is a routine wellness examination for Munir.  Hearing/Vision screen Hearing Screening - Comments:: Wears hearing aids - from [facility] - no audiologist or ENT Vision Screening - Comments:: Wears reading glasses - no optometrist at this time  Dietary issues and exercise activities discussed: Current Exercise Habits: Home exercise routine, Type of exercise: walking;Other - see comments (cutting wood, working in yard), Time (Minutes): 20, Frequency (Times/Week): 7, Weekly Exercise (Minutes/Week): 140, Intensity: Mild, Exercise limited by: cardiac condition(s);orthopedic condition(s)   Goals Addressed             This Visit's Progress    Exercise 3x per week (30 min per time)   On track    02/01/2022 - Hopes to stay active and independent        Depression Screen    02/01/2022    2:56 PM 01/08/2022   10:50 AM 06/12/2021   11:18 AM 05/22/2021   10:48 AM 02/12/2021   10:32 AM 01/31/2021    2:58 PM 08/10/2020   10:20 AM  PHQ 2/9 Scores  PHQ - 2 Score 0 0 0 0 0 0 0  PHQ- 9 Score 1 2 0 0 0      Fall Risk    02/01/2022    2:55 PM 01/08/2022   10:50 AM 05/22/2021   10:48 AM 02/12/2021   10:32 AM 01/31/2021    2:47 PM  Joaquin in the past year? 0 0 0 0  0  Number falls in past yr: 0    0  Injury with Fall? 0    0  Risk for fall due to : No Fall Risks    No Fall Risks  Follow up Falls prevention discussed    Falls prevention discussed    FALL RISK PREVENTION PERTAINING TO THE HOME:  Any stairs in or around the home? Yes  If so,  are there any without handrails? No  Home free of loose throw rugs in walkways, pet beds, electrical cords, etc? Yes  Adequate lighting in your home to reduce risk of falls? Yes   ASSISTIVE DEVICES UTILIZED TO PREVENT FALLS:  Life alert? No  Use of a cane, walker or w/c? No  Grab bars in the bathroom? No  Shower chair or bench in shower? No  Elevated toilet seat or a handicapped toilet? No   TIMED UP AND GO:  Was the test performed? No . Telephonic visit  Cognitive Function:        02/01/2022    3:08 PM 01/31/2021    3:00 PM  6CIT Screen  What Year? 0 points 0 points  What month? 0 points 0 points  What time? 0 points 0 points  Count back from 20 0 points 0 points  Months in reverse 0 points 2 points  Repeat phrase 2 points 2 points  Total Score 2 points 4 points    Immunizations Immunization History  Administered Date(s) Administered   Fluad Quad(high Dose 65+) 05/02/2021   Influenza,inj,Quad PF,6+ Mos 05/18/2019   Influenza-Unspecified 06/08/2020   PFIZER(Purple Top)SARS-COV-2 Vaccination 11/11/2019, 12/03/2019   Pneumococcal Conjugate-13 07/05/2019   Pneumococcal Polysaccharide-23 08/10/2020    TDAP status: Due, Education has been provided regarding the importance of this vaccine. Advised may receive this vaccine at local pharmacy or Health Dept. Aware to provide a copy of the vaccination record if obtained from local pharmacy or Health Dept. Verbalized acceptance and understanding.  Flu Vaccine status: Up to date  Pneumococcal vaccine status: Up to date  Covid-19 vaccine status: Completed vaccines  Qualifies for Shingles Vaccine? Yes   Zostavax completed No   Shingrix Completed?: No.    Education has been provided regarding the importance of this vaccine. Patient has been advised to call insurance company to determine out of pocket expense if they have not yet received this vaccine. Advised may also receive vaccine at local pharmacy or Health Dept. Verbalized  acceptance and understanding.  Screening Tests Health Maintenance  Topic Date Due   COVID-19 Vaccine (3 - Pfizer risk series) 12/31/2019   Zoster Vaccines- Shingrix (1 of 2) 04/10/2022 (Originally 02/03/1960)   TETANUS/TDAP  01/09/2023 (Originally 02/03/1960)   Hepatitis C Screening  01/09/2023 (Originally 02/03/1959)   INFLUENZA VACCINE  02/26/2022   Pneumonia Vaccine 9+ Years old  Completed   HPV VACCINES  Aged Out   COLONOSCOPY (Pts 45-23yr Insurance coverage will need to be confirmed)  Discontinued    Health Maintenance  Health Maintenance Due  Topic Date Due   COVID-19 Vaccine (3 - Pfizer risk series) 12/31/2019    Colorectal cancer screening: No longer required.   Lung Cancer Screening: (Low Dose CT Chest recommended if Age 79-80years, 30 pack-year currently smoking OR have quit w/in 15years.) does not qualify  Additional Screening:  Hepatitis C Screening: does not qualify  Vision Screening: Recommended annual ophthalmology exams for early detection of glaucoma and other disorders of the eye. Is the patient up to date with their annual eye exam?  No  Who is the provider or what is the name of the office in which the patient attends annual eye exams? none If pt is not established with a provider, would they like to be referred to a provider to establish care? No .   Dental Screening: Recommended annual dental exams for proper oral hygiene  Community Resource Referral / Chronic Care Management: CRR required this visit?  No   CCM required this visit?  No      Plan:     I have personally reviewed and noted the following in the patient's chart:   Medical and social history Use of alcohol, tobacco or illicit drugs  Current medications and supplements including opioid prescriptions. Patient is not currently taking opioid prescriptions. Functional ability and status Nutritional status Physical activity Advanced directives List of other physicians Hospitalizations,  surgeries, and ER visits in previous 12 months Vitals Screenings to include cognitive, depression, and falls Referrals and appointments  In addition, I have reviewed and discussed with patient certain preventive protocols, quality metrics, and best practice recommendations. A written personalized care plan for preventive services as well as general preventive health recommendations were provided to patient.     Sandrea Hammond, LPN   10/02/4825   Nurse Notes: None

## 2022-02-01 NOTE — Telephone Encounter (Signed)
metoprolol

## 2022-02-01 NOTE — Patient Instructions (Signed)
Justin Gibbs , Thank you for taking time to come for your Medicare Wellness Visit. I appreciate your ongoing commitment to your health goals. Please review the following plan we discussed and let me know if I can assist you in the future.   Screening recommendations/referrals: Colonoscopy: Done 08/27/2021 - no repeat required Recommended yearly ophthalmology/optometry visit for glaucoma screening and checkup Recommended yearly dental visit for hygiene and checkup  Vaccinations: Influenza vaccine: Done 05/02/2021 - Repeat annually  Pneumococcal vaccine: Done 07/05/2019 & 08/10/2020 Tdap vaccine: Due - recommended every 10 years Shingles vaccine: Due - Shingrix is 2 doses 2-6 months apart and over 90% effective     Covid-19:  Done 11/11/2019 & 12/03/2019  Advanced directives: Advance directive discussed with you today. Even though you declined this today, please call our office should you change your mind, and we can give you the proper paperwork for you to fill out.   Conditions/risks identified: Aim for 30 minutes of exercise or brisk walking, 6-8 glasses of water, and 5 servings of fruits and vegetables each day.   Next appointment: Follow up in one year for your annual wellness visit.   Preventive Care 34 Years and Older, Male  Preventive care refers to lifestyle choices and visits with your health care provider that can promote health and wellness. What does preventive care include? A yearly physical exam. This is also called an annual well check. Dental exams once or twice a year. Routine eye exams. Ask your health care provider how often you should have your eyes checked. Personal lifestyle choices, including: Daily care of your teeth and gums. Regular physical activity. Eating a healthy diet. Avoiding tobacco and drug use. Limiting alcohol use. Practicing safe sex. Taking low doses of aspirin every day. Taking vitamin and mineral supplements as recommended by your health care  provider. What happens during an annual well check? The services and screenings done by your health care provider during your annual well check will depend on your age, overall health, lifestyle risk factors, and family history of disease. Counseling  Your health care provider may ask you questions about your: Alcohol use. Tobacco use. Drug use. Emotional well-being. Home and relationship well-being. Sexual activity. Eating habits. History of falls. Memory and ability to understand (cognition). Work and work Statistician. Screening  You may have the following tests or measurements: Height, weight, and BMI. Blood pressure. Lipid and cholesterol levels. These may be checked every 5 years, or more frequently if you are over 46 years old. Skin check. Lung cancer screening. You may have this screening every year starting at age 42 if you have a 30-pack-year history of smoking and currently smoke or have quit within the past 15 years. Fecal occult blood test (FOBT) of the stool. You may have this test every year starting at age 10. Flexible sigmoidoscopy or colonoscopy. You may have a sigmoidoscopy every 5 years or a colonoscopy every 10 years starting at age 82. Prostate cancer screening. Recommendations will vary depending on your family history and other risks. Hepatitis C blood test. Hepatitis B blood test. Sexually transmitted disease (STD) testing. Diabetes screening. This is done by checking your blood sugar (glucose) after you have not eaten for a while (fasting). You may have this done every 1-3 years. Abdominal aortic aneurysm (AAA) screening. You may need this if you are a current or former smoker. Osteoporosis. You may be screened starting at age 37 if you are at high risk. Talk with your health care provider about  your test results, treatment options, and if necessary, the need for more tests. Vaccines  Your health care provider may recommend certain vaccines, such  as: Influenza vaccine. This is recommended every year. Tetanus, diphtheria, and acellular pertussis (Tdap, Td) vaccine. You may need a Td booster every 10 years. Zoster vaccine. You may need this after age 20. Pneumococcal 13-valent conjugate (PCV13) vaccine. One dose is recommended after age 24. Pneumococcal polysaccharide (PPSV23) vaccine. One dose is recommended after age 61. Talk to your health care provider about which screenings and vaccines you need and how often you need them. This information is not intended to replace advice given to you by your health care provider. Make sure you discuss any questions you have with your health care provider. Document Released: 08/11/2015 Document Revised: 04/03/2016 Document Reviewed: 05/16/2015 Elsevier Interactive Patient Education  2017 [place] Prevention in the Home Falls can cause injuries. They can happen to people of all ages. There are many things you can do to make your home safe and to help prevent falls. What can I do on the outside of my home? Regularly fix the edges of walkways and driveways and fix any cracks. Remove anything that might make you trip as you walk through a door, such as a raised step or threshold. Trim any bushes or trees on the path to your home. Use bright outdoor lighting. Clear any walking paths of anything that might make someone trip, such as rocks or tools. Regularly check to see if handrails are loose or broken. Make sure that both sides of any steps have handrails. Any raised decks and porches should have guardrails on the edges. Have any leaves, snow, or ice cleared regularly. Use sand or salt on walking paths during winter. Clean up any spills in your garage right away. This includes oil or grease spills. What can I do in the bathroom? Use night lights. Install grab bars by the toilet and in the tub and shower. Do not use towel bars as grab bars. Use non-skid mats or decals in the tub or  shower. If you need to sit down in the shower, use a plastic, non-slip stool. Keep the floor dry. Clean up any water that spills on the floor as soon as it happens. Remove soap buildup in the tub or shower regularly. Attach bath mats securely with double-sided non-slip rug tape. Do not have throw rugs and other things on the floor that can make you trip. What can I do in the bedroom? Use night lights. Make sure that you have a light by your bed that is easy to reach. Do not use any sheets or blankets that are too big for your bed. They should not hang down onto the floor. Have a firm chair that has side arms. You can use this for support while you get dressed. Do not have throw rugs and other things on the floor that can make you trip. What can I do in the kitchen? Clean up any spills right away. Avoid walking on wet floors. Keep items that you use a lot in easy-to-reach places. If you need to reach something above you, use a strong step stool that has a grab bar. Keep electrical cords out of the way. Do not use floor polish or wax that makes floors slippery. If you must use wax, use non-skid floor wax. Do not have throw rugs and other things on the floor that can make you trip. What can I do with  my stairs? Do not leave any items on the stairs. Make sure that there are handrails on both sides of the stairs and use them. Fix handrails that are broken or loose. Make sure that handrails are as long as the stairways. Check any carpeting to make sure that it is firmly attached to the stairs. Fix any carpet that is loose or worn. Avoid having throw rugs at the top or bottom of the stairs. If you do have throw rugs, attach them to the floor with carpet tape. Make sure that you have a light switch at the top of the stairs and the bottom of the stairs. If you do not have them, ask someone to add them for you. What else can I do to help prevent falls? Wear shoes that: Do not have high heels. Have  rubber bottoms. Are comfortable and fit you well. Are closed at the toe. Do not wear sandals. If you use a stepladder: Make sure that it is fully opened. Do not climb a closed stepladder. Make sure that both sides of the stepladder are locked into place. Ask someone to hold it for you, if possible. Clearly mark and make sure that you can see: Any grab bars or handrails. First and last steps. Where the edge of each step is. Use tools that help you move around (mobility aids) if they are needed. These include: Canes. Walkers. Scooters. Crutches. Turn on the lights when you go into a dark area. Replace any light bulbs as soon as they burn out. Set up your furniture so you have a clear path. Avoid moving your furniture around. If any of your floors are uneven, fix them. If there are any pets around you, be aware of where they are. Review your medicines with your doctor. Some medicines can make you feel dizzy. This can increase your chance of falling. Ask your doctor what other things that you can do to help prevent falls. This information is not intended to replace advice given to you by your health care provider. Make sure you discuss any questions you have with your health care provider. Document Released: 05/11/2009 Document Revised: 12/21/2015 Document Reviewed: 08/19/2014 Elsevier Interactive Patient Education  2017 [employer].

## 2022-02-17 ENCOUNTER — Other Ambulatory Visit: Payer: Self-pay | Admitting: Hematology and Oncology

## 2022-02-17 DIAGNOSIS — C61 Malignant neoplasm of prostate: Secondary | ICD-10-CM

## 2022-02-18 ENCOUNTER — Inpatient Hospital Stay: Payer: Medicare Other | Admitting: Hematology and Oncology

## 2022-02-18 ENCOUNTER — Other Ambulatory Visit: Payer: Self-pay

## 2022-02-18 ENCOUNTER — Other Ambulatory Visit: Payer: Self-pay | Admitting: Nurse Practitioner

## 2022-02-18 ENCOUNTER — Inpatient Hospital Stay: Payer: Medicare Other | Attending: Hematology and Oncology

## 2022-02-18 VITALS — BP 156/96 | HR 76 | Temp 97.8°F | Resp 16 | Wt 227.9 lb

## 2022-02-18 DIAGNOSIS — I480 Paroxysmal atrial fibrillation: Secondary | ICD-10-CM

## 2022-02-18 DIAGNOSIS — C61 Malignant neoplasm of prostate: Secondary | ICD-10-CM | POA: Diagnosis not present

## 2022-02-18 DIAGNOSIS — D75839 Thrombocytosis, unspecified: Secondary | ICD-10-CM | POA: Diagnosis not present

## 2022-02-18 DIAGNOSIS — Z923 Personal history of irradiation: Secondary | ICD-10-CM | POA: Insufficient documentation

## 2022-02-18 DIAGNOSIS — C7951 Secondary malignant neoplasm of bone: Secondary | ICD-10-CM | POA: Diagnosis not present

## 2022-02-18 DIAGNOSIS — Z79899 Other long term (current) drug therapy: Secondary | ICD-10-CM | POA: Insufficient documentation

## 2022-02-18 DIAGNOSIS — Z87891 Personal history of nicotine dependence: Secondary | ICD-10-CM | POA: Insufficient documentation

## 2022-02-18 DIAGNOSIS — Z7901 Long term (current) use of anticoagulants: Secondary | ICD-10-CM | POA: Diagnosis not present

## 2022-02-18 DIAGNOSIS — D45 Polycythemia vera: Secondary | ICD-10-CM

## 2022-02-18 LAB — CBC WITH DIFFERENTIAL (CANCER CENTER ONLY)
Abs Immature Granulocytes: 0.02 10*3/uL (ref 0.00–0.07)
Basophils Absolute: 0.1 10*3/uL (ref 0.0–0.1)
Basophils Relative: 1 %
Eosinophils Absolute: 0.1 10*3/uL (ref 0.0–0.5)
Eosinophils Relative: 1 %
HCT: 40.6 % (ref 39.0–52.0)
Hemoglobin: 14.7 g/dL (ref 13.0–17.0)
Immature Granulocytes: 0 %
Lymphocytes Relative: 11 %
Lymphs Abs: 0.8 10*3/uL (ref 0.7–4.0)
MCH: 38.3 pg — ABNORMAL HIGH (ref 26.0–34.0)
MCHC: 36.2 g/dL — ABNORMAL HIGH (ref 30.0–36.0)
MCV: 105.7 fL — ABNORMAL HIGH (ref 80.0–100.0)
Monocytes Absolute: 0.5 10*3/uL (ref 0.1–1.0)
Monocytes Relative: 6 %
Neutro Abs: 5.7 10*3/uL (ref 1.7–7.7)
Neutrophils Relative %: 81 %
Platelet Count: 259 10*3/uL (ref 150–400)
RBC: 3.84 MIL/uL — ABNORMAL LOW (ref 4.22–5.81)
RDW: 13.2 % (ref 11.5–15.5)
WBC Count: 7.1 10*3/uL (ref 4.0–10.5)
nRBC: 0 % (ref 0.0–0.2)

## 2022-02-18 LAB — CMP (CANCER CENTER ONLY)
ALT: 19 U/L (ref 0–44)
AST: 20 U/L (ref 15–41)
Albumin: 4.2 g/dL (ref 3.5–5.0)
Alkaline Phosphatase: 49 U/L (ref 38–126)
Anion gap: 4 — ABNORMAL LOW (ref 5–15)
BUN: 24 mg/dL — ABNORMAL HIGH (ref 8–23)
CO2: 35 mmol/L — ABNORMAL HIGH (ref 22–32)
Calcium: 9.3 mg/dL (ref 8.9–10.3)
Chloride: 101 mmol/L (ref 98–111)
Creatinine: 1.15 mg/dL (ref 0.61–1.24)
GFR, Estimated: >60 mL/min (ref >60)
Glucose, Bld: 102 mg/dL — ABNORMAL HIGH (ref 70–99)
Potassium: 3.8 mmol/L (ref 3.5–5.1)
Sodium: 140 mmol/L (ref 135–145)
Total Bilirubin: 1 mg/dL (ref 0.3–1.2)
Total Protein: 6.9 g/dL (ref 6.5–8.1)

## 2022-02-18 NOTE — Progress Notes (Signed)
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 Lorenso Courier  10/11/2020: started phebotomy q 2 weeks and hydroxyurea 564m BID 11/15/2020: WBC 9.0, Hgb 14.9, HCT 44.5%, Plt 384. Holding phlebotomies, patient at target.  02/21/2021: WBC 5.8, Hgb 15.1, Hct 42.8, Plt 208 08/17/2021: WBC 6.6, Hgb 14.3, MCV 110, Plt 228  #Hx of Prostate Cancer 2006: robotic resection of prostate.  11/16/2019: PSA 0.21([facility])  08/08/2020: PSA 0.36 ([facility]  11/28/2021: NM PSMA scan showed widespread skeletal metastatic disease involving calvarium, ribs, spine, pelvis and bilateral proximal femora 12/19/2021: first dose of leuprolide 22.5 mg IM  Interval History:  Justin SINKFIELD81y.o. male with medical history significant for polycythemia vera who presents for a follow up visit. The patient's last visit was on 01/02/2022. In the interim since the last visit the patient has started Lupron therapy.  On exam today Justin Gibbs reports he has been well overall interim since her last visit.  He reports he is not having any pain anywhere right now.  He notes that he is quite active though he does "fatigue easily".  He notes that he still mows the lawn approximately 6 acres and does pressure washing.  He reports that the son  "kills him" and makes him wear and out.  He notes he has been eating well has not been having any trouble with appetite.  He notes his urine was dark yellow earlier this summer but has improved to a light yellow/clear.  He notes he does apparently have hot flashes at nighttime, particularly when lying under covers.  Fortunately has not having any issues with bleeding, bruising, or dark stools.  He is not having any stomach upset, mouth ulcers, or ankle ulcers from the hydroxyurea.  He notes he has not had any weakness, numbness, chest pain, shortness of breath, or lower extremity swelling.  A full 10 point ROS is listed below.  MEDICAL HISTORY:  Past Medical History:  Diagnosis Date   Anxiety    Arthritis    Atrial fibrillation ([place] 06/2020   [place] / [place]   Depression    Dysrhythmia    High triglycerides    Hypertension    Polycythemia vera, acquired ([place]     SURGICAL HISTORY: Past Surgical History:  Procedure Laterality Date   BIOPSY  08/27/2021   Procedure: BIOPSY;  Surgeon: CEloise Harman DO;  [facility];  Service: Endoscopy;;   COLONOSCOPY WITH PROPOFOL N/A 08/27/2021   Procedure: COLONOSCOPY WITH PROPOFOL;  Surgeon: CEloise Harman DO;  [facility];  Service: Endoscopy;  Laterality: N/A;  8:00am   HERNIA REPAIR Bilateral    x 2    POLYPECTOMY  08/27/2021   Procedure: POLYPECTOMY;  Surgeon: CEloise Harman DO;  [facility];  Service: Endoscopy;;   PROSTATECTOMY      SOCIAL HISTORY: Social  History   Socioeconomic History   Marital status: Married    Spouse name: Dawn   Number of children: 3   Years of education: Not on file   Highest education level: Not on file  Occupational History   Occupation: retired     Comment: 2007  Tobacco Use   Smoking status: Former    Packs/day: 1.00    Years: 23.00    Total pack years: 23.00    Types: Cigarettes, Cigars    Quit date: 06/12/1982    Years since  quitting: 39.7   Smokeless tobacco: Never   Tobacco comments:    I was a light smoker  Vaping Use   Vaping Use: Never used  Substance and Sexual Activity   Alcohol use: Never   Drug use: Never   Sexual activity: Not Currently    Comment: Prostate removal  Nov. 15 2006  Other Topics Concern   Not on file  Social History Narrative   Lives with wife and son. He has a basement with a wood stove.   Social Determinants of Health   Financial Resource Strain: Low Risk  (02/01/2022)   Overall Financial Resource Strain (CARDIA)    Difficulty of Paying Living Expenses: Not hard at all  Food Insecurity: No Food Insecurity (02/01/2022)   Hunger Vital Sign    Worried About Running Out of Food in the Last Year: Never true    Ran Out of Food in the Last Year: Never true  Transportation Needs: No Transportation Needs (02/01/2022)   PRAPARE - Hydrologist (Medical): No    Lack of Transportation (Non-Medical): No  Physical Activity: Insufficiently Active (02/01/2022)   Exercise Vital Sign    Days of Exercise per Week: 7 days    Minutes of Exercise per Session: 20 min  Stress: No Stress Concern Present (02/01/2022)   [place]    Feeling of Stress : Not at all  Social Connections: Moderately Isolated (02/01/2022)   Social Connection and Isolation Panel [NHANES]    Frequency of Communication with Friends and Family: More than three times a week    Frequency of Social Gatherings with Friends and Family: More than three times a week    Attends Religious Services: Never    Marine scientist or Organizations: No    Attends Archivist Meetings: Never    Marital Status: Married  Human resources officer Violence: Not At Risk (02/01/2022)   Humiliation, Afraid, Rape, and Kick questionnaire    Fear of Current or Ex-Partner: No    Emotionally Abused: No    Physically Abused: No    Sexually Abused: No     FAMILY HISTORY: Family History  Problem Relation Age of Onset   Alzheimer's disease Mother    Alcohol abuse Father    Cancer Father        leukemia   Cirrhosis Father    Stomach cancer Father    Stroke Brother    Diabetes Daughter    Cancer Maternal Grandmother    Cancer Paternal Grandfather    Prostate cancer Brother    Brain cancer Brother    Thyroid cancer Sister     ALLERGIES:  has No Known Allergies.  MEDICATIONS:  Current Outpatient Medications  Medication Sig Dispense Refill   abiraterone acetate (ZYTIGA) 250 MG tablet Take 4 tablets (1,000 mg total) by mouth daily. Take on an empty stomach 1 hour before or 2  hours after a meal 120 tablet 2   apixaban (ELIQUIS) 5 MG TABS tablet Take 1 tablet (5 mg total) by mouth 2 (two) times daily. 60 tablet 11   hydroxyurea (HYDREA) 500 MG capsule Take 1 capsule (500 mg total) by mouth 2 (two) times daily. May take with food to minimize GI side effects. 180 capsule 1   lisinopril-hydrochlorothiazide (ZESTORETIC) 20-12.5 MG tablet Take 2 tablets by mouth daily. 180 tablet 1   metoprolol tartrate (LOPRESSOR) 25 MG tablet Take 1 tablet (25 mg total) by mouth 2 (two) times daily. 180 tablet 1   Multiple Vitamin (MULTIVITAMIN) capsule Take 1 capsule by mouth daily.     PARoxetine (PAXIL) 10 MG tablet Take 0.5 tablets (5 mg total) by mouth 2 (two) times daily. 90 tablet 1   predniSONE (DELTASONE) 5 MG tablet Take 1 tablet (5 mg total) by mouth daily with breakfast. 90 tablet 1   No current facility-administered medications for this visit.    REVIEW OF SYSTEMS:   Constitutional: ( - ) fevers, ( - )  chills , ( - ) night sweats Eyes: ( - ) blurriness of vision, ( - ) double vision, ( - ) watery eyes Ears, nose, mouth, throat, and face: ( - ) mucositis, ( - ) sore throat Respiratory: ( - ) cough, ( - ) dyspnea, ( - ) wheezes Cardiovascular: ( - ) palpitation, ( - ) chest discomfort, ( - ) lower extremity swelling Gastrointestinal:  ( -  ) nausea, ( - ) heartburn, ( - ) change in bowel habits Skin: ( - ) abnormal skin rashes Lymphatics: ( - ) new lymphadenopathy, ( - ) easy bruising Neurological: ( - ) numbness, ( - ) tingling, ( - ) new weaknesses Behavioral/Psych: ( - ) mood change, ( - ) new changes  All other systems were reviewed with the patient and are negative.  PHYSICAL EXAMINATION:  Vitals:   02/18/22 1436  BP: (!) 156/96  Pulse: 76  Resp: 16  Temp: 97.8 F (36.6 C)  SpO2: 97%    Filed Weights   02/18/22 1436  Weight: 227 lb 14.4 oz (103.4 kg)     GENERAL: well appearing elderly Caucasian male alert, no distress and comfortable SKIN: skin color, texture, turgor are normal, no rashes or significant lesions EYES: conjunctiva are pink and non-injected, sclera clear LUNGS: clear to auscultation and percussion with normal breathing effort HEART: regular rate & rhythm and no murmurs and no lower extremity edema Musculoskeletal: no cyanosis of digits and no clubbing  PSYCH: alert & oriented x 3, fluent speech NEURO: no focal motor/sensory deficits  LABORATORY DATA:  I have reviewed the data as listed    Latest Ref Rng & Units 02/18/2022    1:00 PM 01/02/2022    9:45 AM 12/04/2021    9:50 AM  CBC  WBC 4.0 - 10.5 K/uL 7.1  6.6  7.6   Hemoglobin 13.0 - 17.0 g/dL 14.7  14.5  15.1   Hematocrit 39.0 - 52.0 % 40.6  40.8  43.6   Platelets 150 - 400 K/uL 259  216  263        Latest Ref Rng & Units 02/18/2022    1:00 PM 01/02/2022    9:45 AM 12/04/2021    9:50 AM  CMP  Glucose 70 - 99 mg/dL 102  90  100   BUN 8 - 23 mg/dL 24  30  34   Creatinine 0.61 - 1.24 mg/dL 1.15  1.23  1.43   Sodium 135 - 145 mmol/L 140  140  141   Potassium 3.5 - 5.1 mmol/L 3.8  3.8  4.2   Chloride 98 - 111 mmol/L 101  101  103   CO2 22 - 32 mmol/L 35  33  30   Calcium 8.9 - 10.3 mg/dL 9.3  9.8  9.2   Total Protein 6.5 - 8.1 g/dL 6.9  7.0  7.3   Total Bilirubin 0.3 - 1.2 mg/dL 1.0  0.8  1.1   Alkaline Phos 38 - 126 U/L 49  42  44    AST 15 - 41 U/L _0 ALT 0 - 44 U/L _1 No results found for: "MPROTEIN" No results found for: "KPAFRELGTCHN", "LAMBDASER", "KAPLAMBRATIO"  Pathology:    RADIOGRAPHIC STUDIES: No results found.  ASSESSMENT & PLAN Justin Gibbs 81 y.o. male with medical history significant for polycythemia vera who presents for a follow up visit.   After review of the labs, the records, and discussion with the patient the findings most consistent with a polycythemia vera which is JAK2 V617 F positive.  These results were confirmed with a bone marrow biopsy.  Given the patient's age greater than 20 he is considered a high risk polycythemia vera and therefore would require cytoreductive therapy with hydroxyurea 500 mg twice daily in addition to every 2 week phlebotomies.  Goal hematocrit for this patient is less than 45%.  Additionally he is on Eliquis therapy which will serve as anticoagulation for thromboprophylaxis.  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  Patient at goal HCT goal of <45% --Today Hgb 14.7 with  HCT 40.6 --patient currently at goal HCT, will hold further phlebotomies unless needed.  --holding ASA 31m PO daily as the patient is on eliquis.  -- Continue to monitor while being evaluated for prostate cancer as below.  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  --11/27/2021: widespread metastatic disease noted on PSMA scan.  -- underwent radiation therapy, completed this on 01/31/2021. 12/19/2021: first dose of leuprolide 22.5 mg IM Plan: -- NM PSMA scan due August 2023.  -- First dose of lupron 22.547mafter 2 weeks of bicalutamide therapy on 12/19/2021.  We will plan for next dose in 3 months time (August 2023)  --no indication for zometa therapy at  this time --continue zytiga 1000 mg PO daily with 5 mg PO prednisone PO daily.  --PSA and testosterone pending for today.  --RTC in 3 month to re-evaluate.   Orders Placed This Encounter  Procedures   NM PET (PSMA) SKULL TO MID THIGH    Standing Status:   Future    Standing Expiration Date:   02/19/2023    Order Specific Question:   If indicated for the ordered procedure, I authorize the administration of a radiopharmaceutical per Radiology protocol    Answer:   Yes    Order Specific Question:   [facility]?    [facility]  All questions were answered. The patient knows to call the clinic with any problems, questions or concerns.  A total of more than 30 minutes were spent on this encounter and over half of that time was spent on counseling and coordination of care as outlined above.  Ledell Peoples, MD Department of Hematology/Oncology [place] at [facility] Phone: [phone] Pager: [phone] Email: [email or web address]  02/18/2022 6:34 PM

## 2022-02-19 ENCOUNTER — Other Ambulatory Visit (HOSPITAL_COMMUNITY): Payer: Self-pay

## 2022-02-19 ENCOUNTER — Telehealth: Payer: Self-pay | Admitting: Hematology and Oncology

## 2022-02-19 LAB — PROSTATE-SPECIFIC AG, SERUM (LABCORP): Prostate Specific Ag, Serum: 0.7 ng/mL (ref 0.0–4.0)

## 2022-02-19 LAB — TESTOSTERONE: Testosterone: <3 ng/dL — ABNORMAL LOW (ref 264–916)

## 2022-02-19 NOTE — Telephone Encounter (Signed)
Scheduled per 7/24 los, pt has been called and confirmed

## 2022-02-21 ENCOUNTER — Other Ambulatory Visit (HOSPITAL_COMMUNITY): Payer: Self-pay

## 2022-02-22 ENCOUNTER — Other Ambulatory Visit (HOSPITAL_COMMUNITY): Payer: Self-pay

## 2022-02-28 ENCOUNTER — Other Ambulatory Visit (HOSPITAL_COMMUNITY): Payer: Self-pay

## 2022-03-07 ENCOUNTER — Encounter (HOSPITAL_COMMUNITY)
Admission: RE | Admit: 2022-03-07 | Discharge: 2022-03-07 | Disposition: A | Discharge status: Home / Self Care | Payer: Medicare Other | Source: Ambulatory Visit | Attending: Hematology and Oncology | Admitting: Hematology and Oncology

## 2022-03-07 DIAGNOSIS — R911 Solitary pulmonary nodule: Secondary | ICD-10-CM | POA: Diagnosis not present

## 2022-03-07 DIAGNOSIS — C61 Malignant neoplasm of prostate: Secondary | ICD-10-CM | POA: Diagnosis present

## 2022-03-07 MED ORDER — PIFLIFOLASTAT F 18 (PYLARIFY) INJECTION
9.0000 | Freq: Once | INTRAVENOUS | Status: AC
  Administered 2022-03-07: 9.2 via INTRAVENOUS

## 2022-03-08 ENCOUNTER — Telehealth: Payer: Self-pay | Admitting: *Deleted

## 2022-03-08 NOTE — Telephone Encounter (Signed)
-----   Message from Orson Slick, MD sent at 03/08/2022  1:50 PM EDT ----- Please let Justin Gibbs know that his PSMA scan showed stable disease.  There were no new lesions and no worsening disease.  We will continue with his current treatment.    ----- Message ----- From: Interface, Rad Results In Sent: 03/08/2022  12:07 PM EDT To: Orson Slick, MD

## 2022-03-08 NOTE — Telephone Encounter (Signed)
TCT patient regarding results from yesterday's  PSMA scan. Spoke with him. Advised that his scan is stable disease. No changes in current treatment. Pt voiced understanding. Pleased with results.

## 2022-03-17 ENCOUNTER — Other Ambulatory Visit: Payer: Self-pay | Admitting: Hematology and Oncology

## 2022-03-17 DIAGNOSIS — C61 Malignant neoplasm of prostate: Secondary | ICD-10-CM

## 2022-03-18 ENCOUNTER — Other Ambulatory Visit: Payer: Self-pay

## 2022-03-18 ENCOUNTER — Inpatient Hospital Stay: Payer: Medicare Other

## 2022-03-18 ENCOUNTER — Inpatient Hospital Stay: Payer: Medicare Other | Attending: Hematology and Oncology

## 2022-03-18 VITALS — BP 154/84 | HR 61 | Temp 98.4°F | Resp 20

## 2022-03-18 DIAGNOSIS — Z5111 Encounter for antineoplastic chemotherapy: Secondary | ICD-10-CM | POA: Diagnosis not present

## 2022-03-18 DIAGNOSIS — D45 Polycythemia vera: Secondary | ICD-10-CM

## 2022-03-18 DIAGNOSIS — C7951 Secondary malignant neoplasm of bone: Secondary | ICD-10-CM | POA: Diagnosis not present

## 2022-03-18 DIAGNOSIS — C61 Malignant neoplasm of prostate: Secondary | ICD-10-CM | POA: Diagnosis present

## 2022-03-18 LAB — CMP (CANCER CENTER ONLY)
ALT: 17 U/L (ref 0–44)
AST: 20 U/L (ref 15–41)
Albumin: 4.2 g/dL (ref 3.5–5.0)
Alkaline Phosphatase: 45 U/L (ref 38–126)
Anion gap: 5 (ref 5–15)
BUN: 30 mg/dL — ABNORMAL HIGH (ref 8–23)
CO2: 34 mmol/L — ABNORMAL HIGH (ref 22–32)
Calcium: 9.8 mg/dL (ref 8.9–10.3)
Chloride: 101 mmol/L (ref 98–111)
Creatinine: 1.26 mg/dL — ABNORMAL HIGH (ref 0.61–1.24)
GFR, Estimated: 58 mL/min — ABNORMAL LOW (ref >60)
Glucose, Bld: 105 mg/dL — ABNORMAL HIGH (ref 70–99)
Potassium: 3.9 mmol/L (ref 3.5–5.1)
Sodium: 140 mmol/L (ref 135–145)
Total Bilirubin: 1 mg/dL (ref 0.3–1.2)
Total Protein: 6.7 g/dL (ref 6.5–8.1)

## 2022-03-18 LAB — CBC WITH DIFFERENTIAL (CANCER CENTER ONLY)
Abs Immature Granulocytes: 0.03 10*3/uL (ref 0.00–0.07)
Basophils Absolute: 0.1 10*3/uL (ref 0.0–0.1)
Basophils Relative: 1 %
Eosinophils Absolute: 0.1 10*3/uL (ref 0.0–0.5)
Eosinophils Relative: 1 %
HCT: 39.8 % (ref 39.0–52.0)
Hemoglobin: 14.6 g/dL (ref 13.0–17.0)
Immature Granulocytes: 0 %
Lymphocytes Relative: 12 %
Lymphs Abs: 0.9 10*3/uL (ref 0.7–4.0)
MCH: 39 pg — ABNORMAL HIGH (ref 26.0–34.0)
MCHC: 36.7 g/dL — ABNORMAL HIGH (ref 30.0–36.0)
MCV: 106.4 fL — ABNORMAL HIGH (ref 80.0–100.0)
Monocytes Absolute: 0.5 10*3/uL (ref 0.1–1.0)
Monocytes Relative: 7 %
Neutro Abs: 5.9 10*3/uL (ref 1.7–7.7)
Neutrophils Relative %: 79 %
Platelet Count: 275 10*3/uL (ref 150–400)
RBC: 3.74 MIL/uL — ABNORMAL LOW (ref 4.22–5.81)
RDW: 13.2 % (ref 11.5–15.5)
WBC Count: 7.4 10*3/uL (ref 4.0–10.5)
nRBC: 0 % (ref 0.0–0.2)

## 2022-03-18 MED ORDER — LEUPROLIDE ACETATE (3 MONTH) 22.5 MG ~~LOC~~ KIT
22.5000 mg | PACK | Freq: Once | SUBCUTANEOUS | Status: AC
  Administered 2022-03-18: 22.5 mg via SUBCUTANEOUS
  Filled 2022-03-18: qty 22.5

## 2022-03-19 LAB — PROSTATE-SPECIFIC AG, SERUM (LABCORP): Prostate Specific Ag, Serum: 0.6 ng/mL (ref 0.0–4.0)

## 2022-03-19 LAB — TESTOSTERONE: Testosterone: <3 ng/dL — ABNORMAL LOW (ref 264–916)

## 2022-03-26 ENCOUNTER — Other Ambulatory Visit: Payer: Self-pay | Admitting: Hematology and Oncology

## 2022-03-26 ENCOUNTER — Other Ambulatory Visit (HOSPITAL_COMMUNITY): Payer: Self-pay

## 2022-03-26 MED ORDER — ABIRATERONE ACETATE 250 MG PO TABS
1000.0000 mg | ORAL_TABLET | Freq: Every day | ORAL | 2 refills | Status: DC
  Filled 2022-03-26: qty 120, 30d supply, fill #0
  Filled 2022-04-23: qty 120, 30d supply, fill #1
  Filled 2022-05-24: qty 120, 30d supply, fill #2

## 2022-03-29 ENCOUNTER — Other Ambulatory Visit (HOSPITAL_COMMUNITY): Payer: Self-pay

## 2022-04-15 ENCOUNTER — Inpatient Hospital Stay: Payer: Medicare Other | Attending: Hematology and Oncology | Admitting: Hematology and Oncology

## 2022-04-15 ENCOUNTER — Inpatient Hospital Stay: Payer: Medicare Other

## 2022-04-15 ENCOUNTER — Other Ambulatory Visit: Payer: Self-pay | Admitting: *Deleted

## 2022-04-15 ENCOUNTER — Other Ambulatory Visit: Payer: Self-pay

## 2022-04-15 VITALS — BP 159/95 | HR 74 | Temp 97.9°F | Resp 15 | Wt 226.6 lb

## 2022-04-15 DIAGNOSIS — Z7901 Long term (current) use of anticoagulants: Secondary | ICD-10-CM | POA: Insufficient documentation

## 2022-04-15 DIAGNOSIS — Z87891 Personal history of nicotine dependence: Secondary | ICD-10-CM | POA: Insufficient documentation

## 2022-04-15 DIAGNOSIS — D45 Polycythemia vera: Secondary | ICD-10-CM

## 2022-04-15 DIAGNOSIS — Z79899 Other long term (current) drug therapy: Secondary | ICD-10-CM | POA: Insufficient documentation

## 2022-04-15 DIAGNOSIS — C61 Malignant neoplasm of prostate: Secondary | ICD-10-CM

## 2022-04-15 DIAGNOSIS — C7951 Secondary malignant neoplasm of bone: Secondary | ICD-10-CM | POA: Diagnosis not present

## 2022-04-15 LAB — CMP (CANCER CENTER ONLY)
ALT: 16 U/L (ref 0–44)
AST: 18 U/L (ref 15–41)
Albumin: 4 g/dL (ref 3.5–5.0)
Alkaline Phosphatase: 50 U/L (ref 38–126)
Anion gap: 10 (ref 5–15)
BUN: 32 mg/dL — ABNORMAL HIGH (ref 8–23)
CO2: 30 mmol/L (ref 22–32)
Calcium: 9.2 mg/dL (ref 8.9–10.3)
Chloride: 101 mmol/L (ref 98–111)
Creatinine: 1.12 mg/dL (ref 0.61–1.24)
GFR, Estimated: >60 mL/min (ref >60)
Glucose, Bld: 125 mg/dL — ABNORMAL HIGH (ref 70–99)
Potassium: 3.5 mmol/L (ref 3.5–5.1)
Sodium: 141 mmol/L (ref 135–145)
Total Bilirubin: 0.8 mg/dL (ref 0.3–1.2)
Total Protein: 6.7 g/dL (ref 6.5–8.1)

## 2022-04-15 LAB — CBC WITH DIFFERENTIAL (CANCER CENTER ONLY)
Abs Immature Granulocytes: 0.05 10*3/uL (ref 0.00–0.07)
Basophils Absolute: 0 10*3/uL (ref 0.0–0.1)
Basophils Relative: 0 %
Eosinophils Absolute: 0 10*3/uL (ref 0.0–0.5)
Eosinophils Relative: 1 %
HCT: 39.5 % (ref 39.0–52.0)
Hemoglobin: 14.3 g/dL (ref 13.0–17.0)
Immature Granulocytes: 1 %
Lymphocytes Relative: 10 %
Lymphs Abs: 0.7 10*3/uL (ref 0.7–4.0)
MCH: 39.1 pg — ABNORMAL HIGH (ref 26.0–34.0)
MCHC: 36.2 g/dL — ABNORMAL HIGH (ref 30.0–36.0)
MCV: 107.9 fL — ABNORMAL HIGH (ref 80.0–100.0)
Monocytes Absolute: 0.5 10*3/uL (ref 0.1–1.0)
Monocytes Relative: 7 %
Neutro Abs: 5.8 10*3/uL (ref 1.7–7.7)
Neutrophils Relative %: 81 %
Platelet Count: 283 10*3/uL (ref 150–400)
RBC: 3.66 MIL/uL — ABNORMAL LOW (ref 4.22–5.81)
RDW: 13.4 % (ref 11.5–15.5)
WBC Count: 7.2 10*3/uL (ref 4.0–10.5)
nRBC: 0.3 % — ABNORMAL HIGH (ref 0.0–0.2)

## 2022-04-15 LAB — IRON AND IRON BINDING CAPACITY (CC-WL,HP ONLY)
Iron: 140 ug/dL (ref 45–182)
Saturation Ratios: 39 % (ref 17.9–39.5)
TIBC: 358 ug/dL (ref 250–450)
UIBC: 218 ug/dL (ref 117–376)

## 2022-04-15 LAB — FERRITIN: Ferritin: 154 ng/mL (ref 24–336)

## 2022-04-15 NOTE — Progress Notes (Signed)
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 Lorenso Courier  10/11/2020: started phebotomy q 2 weeks and hydroxyurea 57m BID 11/15/2020: WBC 9.0, Hgb 14.9, HCT 44.5%, Plt 384. Holding phlebotomies, patient at target.  02/21/2021: WBC 5.8, Hgb 15.1, Hct 42.8, Plt 208 08/17/2021: WBC 6.6, Hgb 14.3, MCV 110, Plt 228 04/15/2022: WBC 7.2, Hgb 14.3, HCT 39.5, MCV 107.9, Plt 283  #Hx of Prostate Cancer 2006: robotic resection of prostate.  11/16/2019: PSA 0.21([facility])  08/08/2020: PSA 0.36 ([facility]  11/28/2021: NM PSMA scan showed widespread skeletal metastatic disease involving calvarium, ribs, spine, pelvis and bilateral proximal femora 12/19/2021: first dose of leuprolide 22.5 mg IM 01/23/2022: added abiraterone 1000 mg with prednisone.  03/07/2022: PSMA scan showed similar to minimal improvement in osseous metastasis  Interval History:  Justin LEGGETTE81y.o. male with medical history significant for polycythemia vera who presents for a follow up visit. The patient's last visit was on 02/18/2022. In the interim since the last visit the patient has undergone a PSMA scan which shows stable disease.  On exam today Justin Gibbs reports he has "no energy".  He reports that he is not having any difficulty  with his abiraterone pills or Lupron shots.  He reports that he unfortunately does use the bathroom frequently and urinates approximately every 2 hours.  He reports that occasionally the Lupron shots are painful and reports that his injection of the medication and feels like a "sting like a J[place]hornet".  He reports that he is not having any issues with nausea or vomiting but does have some occasional diarrhea.  He is prone to hot flashes and sweats.  His wife reports he is keeping the house quite cool and has not to sleep under covers.  He notes he has not had any weakness, numbness, chest pain, shortness of breath, or lower extremity swelling.  A full 10 point ROS is listed below.  MEDICAL HISTORY:  Past Medical History:  Diagnosis Date   Anxiety    Arthritis    Atrial fibrillation ([facility] 06/2020   Cancer ([facility]    prostate / removed 2006   Depression    Dysrhythmia    High triglycerides    Hypertension    Polycythemia vera, acquired ([facility]     SURGICAL HISTORY: Past Surgical History:  Procedure Laterality Date   BIOPSY  08/27/2021   Procedure: BIOPSY;  Surgeon: CEloise Harman DO;  [facility];  Service: Endoscopy;;   COLONOSCOPY WITH PROPOFOL N/A 08/27/2021   Procedure: COLONOSCOPY WITH PROPOFOL;  Surgeon: CEloise Harman DO;  Location: [facility];  Service: Endoscopy;  Laterality: N/A;  8:00am   HERNIA REPAIR Bilateral    x 2    POLYPECTOMY  08/27/2021   Procedure: POLYPECTOMY;  Surgeon: CEloise Harman DO;  Location: [facility];  Service: Endoscopy;;   PROSTATECTOMY  SOCIAL HISTORY: Social History   Socioeconomic History   Marital status: Married    Spouse name: Dawn   Number of children: 3   Years of education: Not on file   Highest education level: Not on file  Occupational History   Occupation: retired     Comment: 2007  Tobacco Use   Smoking status: Former    Packs/day: 1.00    Years: 23.00    Total pack years: 23.00    Types:  Cigarettes, Cigars    Quit date: 06/12/1982    Years since quitting: 39.8   Smokeless tobacco: Never   Tobacco comments:    I was a light smoker  Vaping Use   Vaping Use: Never used  Substance and Sexual Activity   Alcohol use: Never   Drug use: Never   Sexual activity: Not Currently    Comment: Prostate removal  Nov. 15 2006  Other Topics Concern   Not on file  Social History Narrative   Lives with wife and son. He has a basement with a wood stove.   Social Determinants of Health   Financial Resource Strain: Low Risk  (02/01/2022)   Overall Financial Resource Strain (CARDIA)    Difficulty of Paying Living Expenses: Not hard at all  Food Insecurity: No Food Insecurity (02/01/2022)   Hunger Vital Sign    Worried About Running Out of Food in the Last Year: Never true    Ran Out of Food in the Last Year: Never true  Transportation Needs: No Transportation Needs (02/01/2022)   PRAPARE - Hydrologist (Medical): No    Lack of Transportation (Non-Medical): No  Physical Activity: Insufficiently Active (02/01/2022)   Exercise Vital Sign    Days of Exercise per Week: 7 days    Minutes of Exercise per Session: 20 min  Stress: No Stress Concern Present (02/01/2022)   [place]    Feeling of Stress : Not at all  Social Connections: Moderately Isolated (02/01/2022)   Social Connection and Isolation Panel [NHANES]    Frequency of Communication with Friends and Family: More than three times a week    Frequency of Social Gatherings with Friends and Family: More than three times a week    Attends Religious Services: Never    Marine scientist or Organizations: No    Attends Archivist Meetings: Never    Marital Status: Married  Human resources officer Violence: Not At Risk (02/01/2022)   Humiliation, Afraid, Rape, and Kick questionnaire    Fear of Current or Ex-Partner: No    Emotionally Abused: No     Physically Abused: No    Sexually Abused: No    FAMILY HISTORY: Family History  Problem Relation Age of Onset   Alzheimer's disease Mother    Alcohol abuse Father    Cancer Father        leukemia   Cirrhosis Father    Stomach cancer Father    Stroke Brother    Diabetes Daughter    Cancer Maternal Grandmother    Cancer Paternal Grandfather    Prostate cancer Brother    Brain cancer Brother    Thyroid cancer Sister     ALLERGIES:  has No Known Allergies.  MEDICATIONS:  Current Outpatient Medications  Medication Sig Dispense Refill   abiraterone acetate (ZYTIGA) 250 MG tablet Take 4 tablets (1,000 mg total) by mouth daily. Take on an empty stomach 1 hour  before or 2 hours after a meal 120 tablet 2   apixaban (ELIQUIS) 5 MG TABS tablet Take 1 tablet (5 mg total) by mouth 2 (two) times daily. 60 tablet 11   hydroxyurea (HYDREA) 500 MG capsule Take 1 capsule (500 mg total) by mouth 2 (two) times daily. May take with food to minimize GI side effects. 180 capsule 1   lisinopril-hydrochlorothiazide (ZESTORETIC) 20-12.5 MG tablet Take 2 tablets by mouth daily. 180 tablet 1   metoprolol tartrate (LOPRESSOR) 25 MG tablet Take 1 tablet (25 mg total) by mouth 2 (two) times daily. 180 tablet 1   Multiple Vitamin (MULTIVITAMIN) capsule Take 1 capsule by mouth daily.     PARoxetine (PAXIL) 10 MG tablet Take 0.5 tablets (5 mg total) by mouth 2 (two) times daily. 90 tablet 1   predniSONE (DELTASONE) 5 MG tablet Take 1 tablet (5 mg total) by mouth daily with breakfast. 90 tablet 1   No current facility-administered medications for this visit.    REVIEW OF SYSTEMS:   Constitutional: ( - ) fevers, ( - )  chills , ( - ) night sweats Eyes: ( - ) blurriness of vision, ( - ) double vision, ( - ) watery eyes Ears, nose, mouth, throat, and face: ( - ) mucositis, ( - ) sore throat Respiratory: ( - ) cough, ( - ) dyspnea, ( - ) wheezes Cardiovascular: ( - ) palpitation, ( - ) chest discomfort, ( -  ) lower extremity swelling Gastrointestinal:  ( - ) nausea, ( - ) heartburn, ( - ) change in bowel habits Skin: ( - ) abnormal skin rashes Lymphatics: ( - ) new lymphadenopathy, ( - ) easy bruising Neurological: ( - ) numbness, ( - ) tingling, ( - ) new weaknesses Behavioral/Psych: ( - ) mood change, ( - ) new changes  All other systems were reviewed with the patient and are negative.  PHYSICAL EXAMINATION:  Vitals:   04/15/22 1451  BP: (!) 159/95  Pulse: 74  Resp: 15  Temp: 97.9 F (36.6 C)  SpO2: 96%    Filed Weights   04/15/22 1451  Weight: 226 lb 9.6 oz (102.8 kg)     GENERAL: well appearing elderly Caucasian male alert, no distress and comfortable SKIN: skin color, texture, turgor are normal, no rashes or significant lesions EYES: conjunctiva are pink and non-injected, sclera clear LUNGS: clear to auscultation and percussion with normal breathing effort HEART: regular rate & rhythm and no murmurs and no lower extremity edema Musculoskeletal: no cyanosis of digits and no clubbing  PSYCH: alert & oriented x 3, fluent speech NEURO: no focal motor/sensory deficits  LABORATORY DATA:  I have reviewed the data as listed    Latest Ref Rng & Units 04/15/2022    1:39 PM 03/18/2022    1:54 PM 02/18/2022    1:00 PM  CBC  WBC 4.0 - 10.5 K/uL 7.2  7.4  7.1   Hemoglobin 13.0 - 17.0 g/dL 14.3  14.6  14.7   Hematocrit 39.0 - 52.0 % 39.5  39.8  40.6   Platelets 150 - 400 K/uL 283  275  259        Latest Ref Rng & Units 04/15/2022    1:39 PM 03/18/2022    1:54 PM 02/18/2022    1:00 PM  CMP  Glucose 70 - 99 mg/dL 125  105  102   BUN 8 - 23 mg/dL 32  30  24   Creatinine 0.61 - 1.24 mg/dL  1.12  1.26  1.15   Sodium 135 - 145 mmol/L 141  140  140   Potassium 3.5 - 5.1 mmol/L 3.5  3.9  3.8   Chloride 98 - 111 mmol/L 101  101  101   CO2 22 - 32 mmol/L 30  34  35   Calcium 8.9 - 10.3 mg/dL 9.2  9.8  9.3   Total Protein 6.5 - 8.1 g/dL 6.7  6.7  6.9   Total Bilirubin 0.3 - 1.2 mg/dL  0.8  1.0  1.0   Alkaline Phos 38 - 126 U/L 50  45  49   AST 15 - 41 U/L 18  20  20    ALT 0 - 44 U/L 16  17  19      No results found for: "MPROTEIN" No results found for: "KPAFRELGTCHN", "LAMBDASER", "KAPLAMBRATIO"  Pathology:    RADIOGRAPHIC STUDIES: No results found.  ASSESSMENT & PLAN Justin Gibbs 81 y.o. male with medical history significant for polycythemia vera who presents for a follow up visit.   After review of the labs, the records, and discussion with the patient the findings most consistent with a polycythemia vera which is JAK2 V617 F positive.  These results were confirmed with a bone marrow biopsy.  Given the patient's age greater than 85 he is considered a high risk polycythemia vera and therefore would require cytoreductive therapy with hydroxyurea 500 mg twice daily in addition to every 2 week phlebotomies.  Goal hematocrit for this patient is less than 45%.  Additionally he is on Eliquis therapy which will serve as anticoagulation for thromboprophylaxis.  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  Patient at goal HCT goal of <45% --Today Hgb 14.3 with  HCT 39.5 --patient currently at goal HCT, will hold further phlebotomies unless needed.  --holding ASA 81m PO daily as the patient is on eliquis.  -- Continue to monitor while being treated for prostate cancer as below.  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  --11/27/2021: widespread metastatic disease noted on PSMA scan.  -- underwent radiation therapy, completed this on 01/31/2021. 12/19/2021: first dose of leuprolide 22.5 mg IM Plan: -- NM PSMA scan August 2023 showed stable disease.  -- continue q 3 month lupron shots. Next due Nov 2023.  --no indication for zometa therapy at this  time --continue zytiga 1000 mg PO daily with 5 mg PO prednisone PO daily.  --PSA and testosterone pending for today. Levels are castrate <3 at last check with PSA <1.0  --RTC in 3 month to re-evaluate.   No orders of the defined types were placed in this encounter.   All questions were answered. The patient knows to call the clinic with any problems, questions or concerns.  A total of more than 30 minutes were spent on this encounter and over half of that time was spent on counseling and coordination of care as outlined above.   JLedell Peoples MD Department of Hematology/Oncology [facility] HospitalPhone: [phone]: [phone]: [email or web address]  04/16/2022 5:01 PM

## 2022-04-16 ENCOUNTER — Encounter: Payer: Self-pay | Admitting: Hematology and Oncology

## 2022-04-16 LAB — PROSTATE-SPECIFIC AG, SERUM (LABCORP): Prostate Specific Ag, Serum: 0.5 ng/mL (ref 0.0–4.0)

## 2022-04-16 LAB — TESTOSTERONE: Testosterone: <3 ng/dL — ABNORMAL LOW (ref 264–916)

## 2022-04-17 ENCOUNTER — Telehealth: Payer: Self-pay | Admitting: Hematology and Oncology

## 2022-04-17 NOTE — Telephone Encounter (Signed)
Per 9/18 los called pt and was unable to leave message. No answer.  Calender is being mailed

## 2022-04-23 ENCOUNTER — Other Ambulatory Visit (HOSPITAL_COMMUNITY): Payer: Self-pay

## 2022-04-24 ENCOUNTER — Other Ambulatory Visit (HOSPITAL_COMMUNITY): Payer: Self-pay

## 2022-05-02 ENCOUNTER — Other Ambulatory Visit (HOSPITAL_COMMUNITY): Payer: Self-pay

## 2022-05-24 ENCOUNTER — Other Ambulatory Visit (HOSPITAL_COMMUNITY): Payer: Self-pay

## 2022-05-31 ENCOUNTER — Other Ambulatory Visit (HOSPITAL_COMMUNITY): Payer: Self-pay

## 2022-06-02 ENCOUNTER — Other Ambulatory Visit: Payer: Self-pay | Admitting: Nurse Practitioner

## 2022-06-02 DIAGNOSIS — I480 Paroxysmal atrial fibrillation: Secondary | ICD-10-CM

## 2022-06-17 ENCOUNTER — Other Ambulatory Visit: Payer: Self-pay | Admitting: Hematology and Oncology

## 2022-06-17 ENCOUNTER — Inpatient Hospital Stay: Payer: Medicare Other | Attending: Hematology and Oncology

## 2022-06-17 ENCOUNTER — Inpatient Hospital Stay: Payer: Medicare Other

## 2022-06-17 ENCOUNTER — Inpatient Hospital Stay (HOSPITAL_BASED_OUTPATIENT_CLINIC_OR_DEPARTMENT_OTHER): Payer: Medicare Other | Admitting: Hematology and Oncology

## 2022-06-17 VITALS — BP 157/88 | HR 75 | Temp 98.1°F | Resp 17 | Wt 227.9 lb

## 2022-06-17 DIAGNOSIS — I480 Paroxysmal atrial fibrillation: Secondary | ICD-10-CM | POA: Diagnosis not present

## 2022-06-17 DIAGNOSIS — I10 Essential (primary) hypertension: Secondary | ICD-10-CM | POA: Diagnosis not present

## 2022-06-17 DIAGNOSIS — C61 Malignant neoplasm of prostate: Secondary | ICD-10-CM | POA: Insufficient documentation

## 2022-06-17 DIAGNOSIS — D45 Polycythemia vera: Secondary | ICD-10-CM | POA: Insufficient documentation

## 2022-06-17 DIAGNOSIS — Z923 Personal history of irradiation: Secondary | ICD-10-CM | POA: Diagnosis not present

## 2022-06-17 DIAGNOSIS — Z5111 Encounter for antineoplastic chemotherapy: Secondary | ICD-10-CM | POA: Insufficient documentation

## 2022-06-17 DIAGNOSIS — Z87891 Personal history of nicotine dependence: Secondary | ICD-10-CM | POA: Insufficient documentation

## 2022-06-17 DIAGNOSIS — C7951 Secondary malignant neoplasm of bone: Secondary | ICD-10-CM | POA: Insufficient documentation

## 2022-06-17 LAB — CBC WITH DIFFERENTIAL (CANCER CENTER ONLY)
Abs Immature Granulocytes: 0.04 10*3/uL (ref 0.00–0.07)
Basophils Absolute: 0.1 10*3/uL (ref 0.0–0.1)
Basophils Relative: 1 %
Eosinophils Absolute: 0.1 10*3/uL (ref 0.0–0.5)
Eosinophils Relative: 1 %
HCT: 40.3 % (ref 39.0–52.0)
Hemoglobin: 14.3 g/dL (ref 13.0–17.0)
Immature Granulocytes: 1 %
Lymphocytes Relative: 11 %
Lymphs Abs: 0.7 10*3/uL (ref 0.7–4.0)
MCH: 39 pg — ABNORMAL HIGH (ref 26.0–34.0)
MCHC: 35.5 g/dL (ref 30.0–36.0)
MCV: 109.8 fL — ABNORMAL HIGH (ref 80.0–100.0)
Monocytes Absolute: 0.5 10*3/uL (ref 0.1–1.0)
Monocytes Relative: 7 %
Neutro Abs: 5.7 10*3/uL (ref 1.7–7.7)
Neutrophils Relative %: 79 %
Platelet Count: 275 10*3/uL (ref 150–400)
RBC: 3.67 MIL/uL — ABNORMAL LOW (ref 4.22–5.81)
RDW: 12.6 % (ref 11.5–15.5)
WBC Count: 7 10*3/uL (ref 4.0–10.5)
nRBC: 0 % (ref 0.0–0.2)

## 2022-06-17 LAB — CMP (CANCER CENTER ONLY)
ALT: 13 U/L (ref 0–44)
AST: 15 U/L (ref 15–41)
Albumin: 4.2 g/dL (ref 3.5–5.0)
Alkaline Phosphatase: 52 U/L (ref 38–126)
Anion gap: 5 (ref 5–15)
BUN: 29 mg/dL — ABNORMAL HIGH (ref 8–23)
CO2: 34 mmol/L — ABNORMAL HIGH (ref 22–32)
Calcium: 9.8 mg/dL (ref 8.9–10.3)
Chloride: 102 mmol/L (ref 98–111)
Creatinine: 1.22 mg/dL (ref 0.61–1.24)
GFR, Estimated: >60 mL/min (ref >60)
Glucose, Bld: 105 mg/dL — ABNORMAL HIGH (ref 70–99)
Potassium: 4.2 mmol/L (ref 3.5–5.1)
Sodium: 141 mmol/L (ref 135–145)
Total Bilirubin: 0.7 mg/dL (ref 0.3–1.2)
Total Protein: 7 g/dL (ref 6.5–8.1)

## 2022-06-17 LAB — FERRITIN: Ferritin: 156 ng/mL (ref 24–336)

## 2022-06-17 MED ORDER — APIXABAN 5 MG PO TABS: 5.0000 mg | ORAL_TABLET | Freq: Two times a day (BID) | ORAL | 0 refills | Status: DC

## 2022-06-17 MED ORDER — HYDROXYUREA 500 MG PO CAPS: 500.0000 mg | ORAL_CAPSULE | Freq: Two times a day (BID) | ORAL | 1 refills | Status: DC

## 2022-06-17 MED ORDER — METOPROLOL TARTRATE 25 MG PO TABS: 25.0000 mg | ORAL_TABLET | Freq: Two times a day (BID) | ORAL | 1 refills | Status: DC

## 2022-06-17 MED ORDER — LISINOPRIL-HYDROCHLOROTHIAZIDE 20-12.5 MG PO TABS: 2.0000 | ORAL_TABLET | Freq: Every day | ORAL | 1 refills | Status: DC

## 2022-06-17 MED ORDER — ABIRATERONE ACETATE 250 MG PO TABS: 1000.0000 mg | ORAL_TABLET | Freq: Every day | ORAL | 2 refills | Status: DC

## 2022-06-17 MED ORDER — LEUPROLIDE ACETATE (3 MONTH) 22.5 MG ~~LOC~~ KIT
22.5000 mg | PACK | Freq: Once | SUBCUTANEOUS | Status: AC
  Administered 2022-06-17: 22.5 mg via SUBCUTANEOUS
  Filled 2022-06-17: qty 22.5

## 2022-06-17 MED ORDER — PREDNISONE 5 MG PO TABS: 5.0000 mg | ORAL_TABLET | Freq: Every day | ORAL | 1 refills | Status: DC

## 2022-06-17 NOTE — Progress Notes (Signed)
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 Lorenso Courier  10/11/2020: started phebotomy q 2 weeks and hydroxyurea 574m BID 11/15/2020: WBC 9.0, Hgb 14.9, HCT 44.5%, Plt 384. Holding phlebotomies, patient at target.  02/21/2021: WBC 5.8, Hgb 15.1, Hct 42.8, Plt 208 08/17/2021: WBC 6.6, Hgb 14.3, MCV 110, Plt 228 04/15/2022: WBC 7.2, Hgb 14.3, HCT 39.5, MCV 107.9, Plt 283  #Hx of Prostate Cancer 2006: robotic resection of prostate.  11/16/2019: PSA 0.21([facility])  08/08/2020: PSA 0.36 ([facility]  11/28/2021: NM PSMA scan showed widespread skeletal metastatic disease involving calvarium, ribs, spine, pelvis and bilateral proximal femora 12/19/2021: first dose of leuprolide 22.5 mg IM 01/23/2022: added abiraterone 1000 mg with prednisone.  03/07/2022: PSMA scan showed similar to minimal improvement in osseous metastasis  Interval History:  Justin SILVIO81y.o. male with medical history significant for polycythemia vera who presents for a follow up visit. The patient's last visit was on 04/15/2022. In the interim since the last visit the patient has had no major changes in his health.  On exam today Justin Gibbs reports he has been feeling good in the interim since our last visit.  He reports that he has  not been having any issues with bleeding or bruising.  He does continue to struggle with fatigue.  He notes that he does get some occasional bouts of atrial fibrillation and that his heart "does not pump as well as it should".  He is prone to hot flashes and sweats.  Fortunately he is not having any nausea, vomiting, or diarrhea.  He notes that he is tolerating his treatments well overall.  He is not having any stomach upset, bone pain, or back pain.  Overall he is willing and able to proceed with Lupron shots and hydroxyurea therapy at this time.   A full 10 point ROS is listed below.  MEDICAL HISTORY:  Past Medical History:  Diagnosis Date   Anxiety    Arthritis    Atrial fibrillation ([place] 06/2020   [place]cer ([place]    prostate / removed 2006   Depression    Dysrhythmia    High triglycerides    Hypertension    Poly[place]mia vera, [place]     SURGICAL HISTORY: Past Surgical History:  Procedure Laterality Date   BIOPSY  08/27/2021   Procedure: BIOPSY;  Surgeon: CEloise Harman DO;  [facility];  Service: Endoscopy;;   COLONOSCOPY WITH PROPOFOL N/A 08/27/2021   Procedure: COLONOSCOPY WITH PROPOFOL;  Surgeon: CEloise Harman DO;  [facility];  Service: Endoscopy;  Laterality: N/A;  8:00am   HERNIA REPAIR Bilateral    x 2    POLYPECTOMY  08/27/2021   Procedure: POLYPECTOMY;  Surgeon: CEloise Harman DO;  [facility];  Service: Endoscopy;;   PROSTATECTOMY      SOCIAL HISTORY: Social History   Socioeconomic History  Marital status: Married    Spouse name: Dawn   Number of children: 3   Years of education: Not on file   Highest education level: Not on file  Occupational History   Occupation: retired     Comment: 2007  Tobacco Use   Smoking status: Former    Packs/day: 1.00    Years: 23.00    Total pack years: 23.00    Types: Cigarettes, Cigars    Quit date: 06/12/1982    Years since quitting: 40.0   Smokeless tobacco: Never    Tobacco comments:    I was a light smoker  Vaping Use   Vaping Use: Never used  Substance and Sexual Activity   Alcohol use: Never   Drug use: Never   Sexual activity: Not Currently    Comment: Prostate removal  Nov. 15 2006  Other Topics Concern   Not on file  Social History Narrative   Lives with wife and son. He has a basement with a wood stove.   Social Determinants of Health   Financial Resource Strain: Low Risk  (02/01/2022)   Overall Financial Resource Strain (CARDIA)    Difficulty of Paying Living Expenses: Not hard at all  Food Insecurity: No Food Insecurity (02/01/2022)   Hunger Vital Sign    Worried About Running Out of Food in the Last Year: Never true    Ran Out of Food in the Last Year: Never true  Transportation Needs: No Transportation Needs (02/01/2022)   PRAPARE - Hydrologist (Medical): No    Lack of Transportation (Non-Medical): No  Physical Activity: Insufficiently Active (02/01/2022)   Exercise Vital Sign    Days of Exercise per Week: 7 days    Minutes of Exercise per Session: 20 min  Stress: No Stress Concern Present (02/01/2022)   Yellow Medicine    Feeling of Stress : Not at all  Social Connections: Moderately Isolated (02/01/2022)   Social Connection and Isolation Panel [NHANES]    Frequency of Communication with Friends and Family: More than three times a week    Frequency of Social Gatherings with Friends and Family: More than three times a week    Attends Religious Services: Never    Marine scientist or Organizations: No    Attends Archivist Meetings: Never    Marital Status: Married  Human resources officer Violence: Not At Risk (02/01/2022)   Humiliation, Afraid, Rape, and Kick questionnaire    Fear of Current or Ex-Partner: No    Emotionally Abused: No    Physically Abused: No    Sexually Abused: No    FAMILY HISTORY: Family History  Problem Relation  Age of Onset   Alzheimer's disease Mother    Alcohol abuse Father    Cancer Father        leukemia   Cirrhosis Father    Stomach cancer Father    Stroke Brother    Diabetes Daughter    Cancer Maternal Grandmother    Cancer Paternal Grandfather    Prostate cancer Brother    Brain cancer Brother    Thyroid cancer Sister     ALLERGIES:  has No Known Allergies.  MEDICATIONS:  Current Outpatient Medications  Medication Sig Dispense Refill   abiraterone acetate (ZYTIGA) 250 MG tablet Take 4 tablets (1,000 mg total) by mouth daily. Take on an empty stomach 1 hour before or 2 hours after a meal 360 each 2  apixaban (ELIQUIS) 5 MG TABS tablet Take 1 tablet (5 mg total) by mouth 2 (two) times daily. (NEEDS TO BE SEEN BEFORE NEXT REFILL) 60 tablet 0   hydroxyurea (HYDREA) 500 MG capsule Take 1 capsule (500 mg total) by mouth 2 (two) times daily. May take with food to minimize GI side effects. 180 capsule 1   lisinopril-hydrochlorothiazide (ZESTORETIC) 20-12.5 MG tablet Take 2 tablets by mouth daily. 180 tablet 1   metoprolol tartrate (LOPRESSOR) 25 MG tablet Take 1 tablet (25 mg total) by mouth 2 (two) times daily. 180 tablet 1   Multiple Vitamin (MULTIVITAMIN) capsule Take 1 capsule by mouth daily.     PARoxetine (PAXIL) 10 MG tablet Take 0.5 tablets (5 mg total) by mouth 2 (two) times daily. 90 tablet 1   predniSONE (DELTASONE) 5 MG tablet Take 1 tablet (5 mg total) by mouth daily with breakfast. 90 tablet 1   No current facility-administered medications for this visit.    REVIEW OF SYSTEMS:   Constitutional: ( - ) fevers, ( - )  chills , ( - ) night sweats Eyes: ( - ) blurriness of vision, ( - ) double vision, ( - ) watery eyes Ears, nose, mouth, throat, and face: ( - ) mucositis, ( - ) sore throat Respiratory: ( - ) cough, ( - ) dyspnea, ( - ) wheezes Cardiovascular: ( - ) palpitation, ( - ) chest discomfort, ( - ) lower extremity swelling Gastrointestinal:  ( - ) nausea, ( - )  heartburn, ( - ) change in bowel habits Skin: ( - ) abnormal skin rashes Lymphatics: ( - ) new lymphadenopathy, ( - ) easy bruising Neurological: ( - ) numbness, ( - ) tingling, ( - ) new weaknesses Behavioral/Psych: ( - ) mood change, ( - ) new changes  All other systems were reviewed with the patient and are negative.  PHYSICAL EXAMINATION:  Vitals:   06/17/22 1420  BP: (!) 157/88  Pulse: 75  Resp: 17  Temp: 98.1 F (36.7 C)  SpO2: 98%    Filed Weights   06/17/22 1420  Weight: 227 lb 14.4 oz (103.4 kg)     GENERAL: well appearing elderly Caucasian male alert, no distress and comfortable SKIN: skin color, texture, turgor are normal, no rashes or significant lesions EYES: conjunctiva are pink and non-injected, sclera clear LUNGS: clear to auscultation and percussion with normal breathing effort HEART: regular rate & rhythm and no murmurs and no lower extremity edema Musculoskeletal: no cyanosis of digits and no clubbing  PSYCH: alert & oriented x 3, fluent speech NEURO: no focal motor/sensory deficits  LABORATORY DATA:  I have reviewed the data as listed    Latest Ref Rng & Units 06/17/2022    1:46 PM 04/15/2022    1:39 PM 03/18/2022    1:54 PM  CBC  WBC 4.0 - 10.5 K/uL 7.0  7.2  7.4   Hemoglobin 13.0 - 17.0 g/dL 14.3  14.3  14.6   Hematocrit 39.0 - 52.0 % 40.3  39.5  39.8   Platelets 150 - 400 K/uL 275  283  275        Latest Ref Rng & Units 06/17/2022    1:46 PM 04/15/2022    1:39 PM 03/18/2022    1:54 PM  CMP  Glucose 70 - 99 mg/dL 105  125  105   BUN 8 - 23 mg/dL 29  32  30   Creatinine 0.61 - 1.24 mg/dL 1.22  1.12  1.26  Sodium 135 - 145 mmol/L 141  141  140   Potassium 3.5 - 5.1 mmol/L 4.2  3.5  3.9   Chloride 98 - 111 mmol/L 102  101  101   CO2 22 - 32 mmol/L 34  30  34   Calcium 8.9 - 10.3 mg/dL 9.8  9.2  9.8   Total Protein 6.5 - 8.1 g/dL 7.0  6.7  6.7   Total Bilirubin 0.3 - 1.2 mg/dL 0.7  0.8  1.0   Alkaline Phos 38 - 126 U/L 52  50  45   AST  15 - 41 U/L _0 ALT 0 - 44 U/L _1 No results found for: "MPROTEIN" No results found for: "KPAFRELGTCHN", "LAMBDASER", "KAPLAMBRATIO"  Pathology:    RADIOGRAPHIC STUDIES: No results found.  ASSESSMENT & PLAN Justin Gibbs 81 y.o. male with medical history significant for polycythemia vera who presents for a follow up visit.   After review of the labs, the records, and discussion with the patient the findings most consistent with a polycythemia vera which is JAK2 V617 F positive.  These results were confirmed with a bone marrow biopsy.  Given the patient's age greater than 27 he is considered a high risk polycythemia vera and therefore would require cytoreductive therapy with hydroxyurea 500 mg twice daily in addition to every 2 week phlebotomies.  Goal hematocrit for this patient is less than 45%.  Additionally he is on Eliquis therapy which will serve as anticoagulation for thromboprophylaxis.  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  Patient at goal HCT goal of <45% --Today Hgb 14.3 with  HCT 40.3 --patient currently at goal HCT, will hold further phlebotomies unless needed.  --holding ASA 66m PO daily as the patient is on eliquis.  -- Continue to monitor while being treated for prostate cancer as below.  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  --11/27/2021: widespread metastatic disease noted on PSMA scan.  -- underwent radiation therapy, completed this on 01/31/2021. 12/19/2021: first dose of leuprolide 22.5 mg IM Plan: -- NM PSMA scan August 2023 showed stable disease. Next imaging in Feb 2024 -- continue q 3 month lupron shots. Administered today, Next due Feb 2024.  --no indication for zometa therapy at this time --continue zytiga 1000  mg PO daily with 5 mg PO prednisone PO daily.  --PSA and testosterone are castrate <3 at last check with PSA <1.0  --RTC in 3 month to re-evaluate.   Orders Placed This Encounter  Procedures   CT CHEST ABDOMEN PELVIS W CONTRAST    Standing Status:   Future    Standing Expiration Date:   06/18/2023    Order Specific Question:   If indicated for the ordered procedure, I authorize the administration of contrast media per Radiology protocol    Answer:   Yes    Order Specific Question:   Does the patient have a contrast media/X-ray dye allergy?    Answer:   No    Order Specific Question:   [facility]    [facility]   Order Specific Question:   Is Oral Contrast requested for this exam?    Answer:   Yes, Per Radiology protocol    All questions were answered.  The patient knows to call the clinic with any problems, questions or concerns.  A total of more than 30 minutes were spent on this encounter and over half of that time was spent on counseling and coordination of care as outlined above.   Ledell Peoples, MD Department of Hematology/Oncology [place] [facility] Phone: [phone] Pager: [phone] Email: [email or web address]  06/17/2022 3:57 PM

## 2022-06-18 ENCOUNTER — Other Ambulatory Visit (HOSPITAL_COMMUNITY): Payer: Self-pay

## 2022-06-18 ENCOUNTER — Telehealth: Payer: Self-pay | Admitting: Pharmacist

## 2022-06-18 DIAGNOSIS — C61 Malignant neoplasm of prostate: Secondary | ICD-10-CM

## 2022-06-18 MED ORDER — ABIRATERONE ACETATE 250 MG PO TABS
1000.0000 mg | ORAL_TABLET | Freq: Every day | ORAL | 2 refills | Status: DC
  Filled 2022-06-18: qty 360, 90d supply, fill #0
  Filled 2022-06-25: qty 120, 30d supply, fill #0
  Filled 2022-07-04 – 2022-07-12 (×3): qty 120, 30d supply, fill #1
  Filled 2022-08-27: qty 120, 30d supply, fill #2
  Filled 2022-09-20: qty 120, 30d supply, fill #3
  Filled 2022-10-17: qty 120, 30d supply, fill #4
  Filled 2022-11-15: qty 120, 30d supply, fill #5
  Filled 2022-12-10: qty 120, 30d supply, fill #6
  Filled 2023-01-16: qty 120, 30d supply, fill #7
  Filled 2023-02-13: qty 120, 30d supply, fill #8

## 2022-06-18 NOTE — Telephone Encounter (Signed)
Oral Oncology Pharmacist Encounter  Prescription refill for Zytiga (abiraterone) sent to [facility] in error. Patient fills medication through [facility]. Prescription redirected to [facility] for dispensing.  Leron Croak, PharmD, BCPS, BCOP Hematology/Oncology Clinical Pharmacist [facility] and [place] [phone] 06/18/2022 9:00 AM

## 2022-06-25 ENCOUNTER — Other Ambulatory Visit (HOSPITAL_COMMUNITY): Payer: Self-pay

## 2022-06-25 ENCOUNTER — Other Ambulatory Visit: Payer: Self-pay | Admitting: Hematology and Oncology

## 2022-06-25 MED ORDER — PREDNISONE 5 MG PO TABS
5.0000 mg | ORAL_TABLET | Freq: Every day | ORAL | 1 refills | Status: DC
  Filled 2022-06-28 – 2022-09-20 (×7): qty 90, 90d supply, fill #0
  Filled 2022-12-10: qty 90, 90d supply, fill #1

## 2022-06-26 ENCOUNTER — Other Ambulatory Visit (HOSPITAL_COMMUNITY): Payer: Self-pay

## 2022-06-27 ENCOUNTER — Other Ambulatory Visit (HOSPITAL_COMMUNITY): Payer: Self-pay

## 2022-06-27 ENCOUNTER — Other Ambulatory Visit: Payer: Self-pay | Admitting: Nurse Practitioner

## 2022-06-27 DIAGNOSIS — F411 Generalized anxiety disorder: Secondary | ICD-10-CM

## 2022-06-28 ENCOUNTER — Other Ambulatory Visit (HOSPITAL_COMMUNITY): Payer: Self-pay

## 2022-07-04 ENCOUNTER — Telehealth: Payer: Self-pay | Admitting: Pharmacist

## 2022-07-04 ENCOUNTER — Other Ambulatory Visit (HOSPITAL_COMMUNITY): Payer: Self-pay

## 2022-07-04 NOTE — Telephone Encounter (Signed)
Oral Oncology Pharmacist Encounter   Received notification from [employer] that prior authorization renewal for Abiraterone Acetate is required as current PA on file expires 07/28/22.   PA submitted on CoverMyMeds Key: [id number] Status is pending   Oral Oncology Clinic will continue to follow.   Leron Croak, PharmD, BCPS, BCOP Hematology/Oncology Clinical Pharmacist [facility] [place] [phone] 07/04/2022 12:40 PM

## 2022-07-04 NOTE — Telephone Encounter (Signed)
Oral Oncology Pharmacist Encounter  Prior Authorization renewal for Abiraterone Acetate has been approved.    PA# [id number]  Effective dates: 07/04/22 through 07/29/23  Patient will continue filling through [facility].   Leron Croak, PharmD, BCPS, [facility] Hematology/Oncology Clinical Pharmacist [facility] and [place] [phone] 07/04/2022 12:43 PM

## 2022-07-12 ENCOUNTER — Other Ambulatory Visit: Payer: Self-pay

## 2022-07-12 ENCOUNTER — Other Ambulatory Visit (HOSPITAL_COMMUNITY): Payer: Self-pay

## 2022-07-23 ENCOUNTER — Other Ambulatory Visit (HOSPITAL_COMMUNITY): Payer: Self-pay

## 2022-07-24 ENCOUNTER — Other Ambulatory Visit (HOSPITAL_COMMUNITY): Payer: Self-pay

## 2022-07-30 ENCOUNTER — Other Ambulatory Visit: Payer: Self-pay | Admitting: Hematology and Oncology

## 2022-07-30 DIAGNOSIS — I480 Paroxysmal atrial fibrillation: Secondary | ICD-10-CM

## 2022-08-13 ENCOUNTER — Other Ambulatory Visit (HOSPITAL_COMMUNITY): Payer: Self-pay

## 2022-08-24 IMAGING — PT NM PET TUM IMG SKULL BASE T - THIGH
2 series · 8 of 8 positions shown · non-contrast
Comparison: Abdomen and pelvis CT from 5394. No recent imaging is
available for comparison.

CLINICAL DATA: Elevated PSA after salvage radiation following prior
prostatectomy in the setting of prostate cancer.

EXAM:
NUCLEAR MEDICINE PET SKULL BASE TO THIGH
TECHNIQUE: 9.4 mCi F18 Piflufolastat (Pylarify) was injected intravenously.
Full-ring PET imaging was performed from the skull base to thigh
after the radiotracer. CT data was obtained and used for attenuation
correction and anatomic localization.

[Series 1106: results mm oncology reading · 1.0mm · 0.50mm/px · 7 of 7 slices shown (1 of 2)]
[im 1/7]
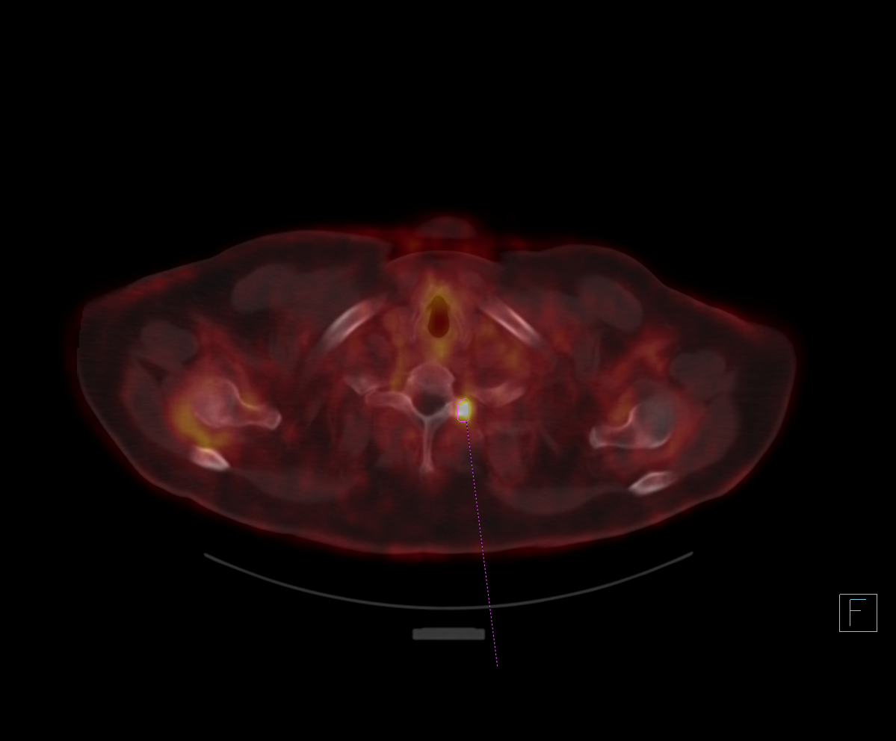
[im 2/7]
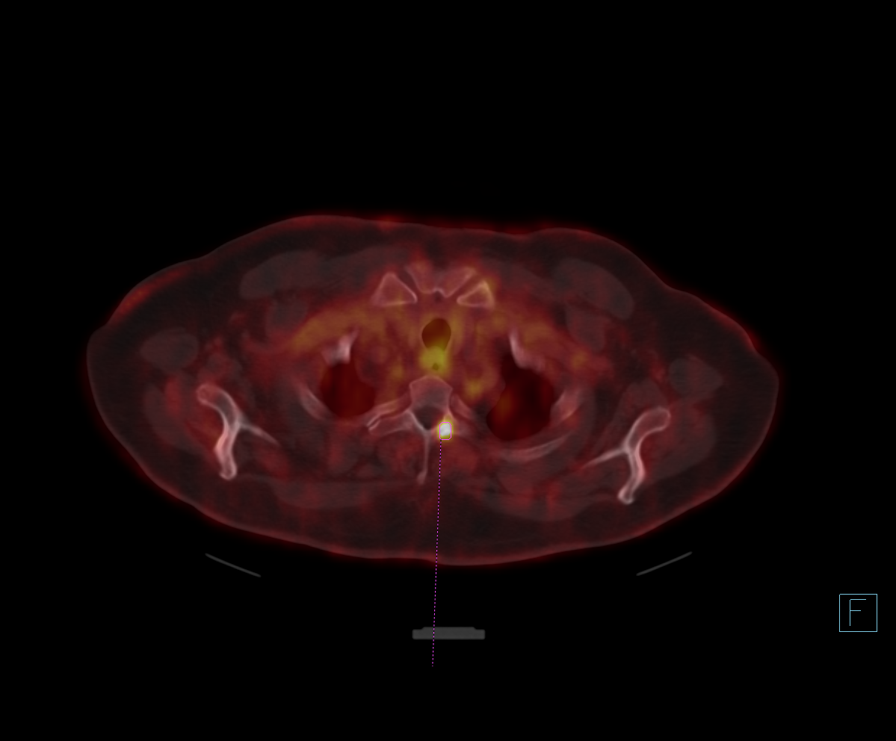
[im 3/7]
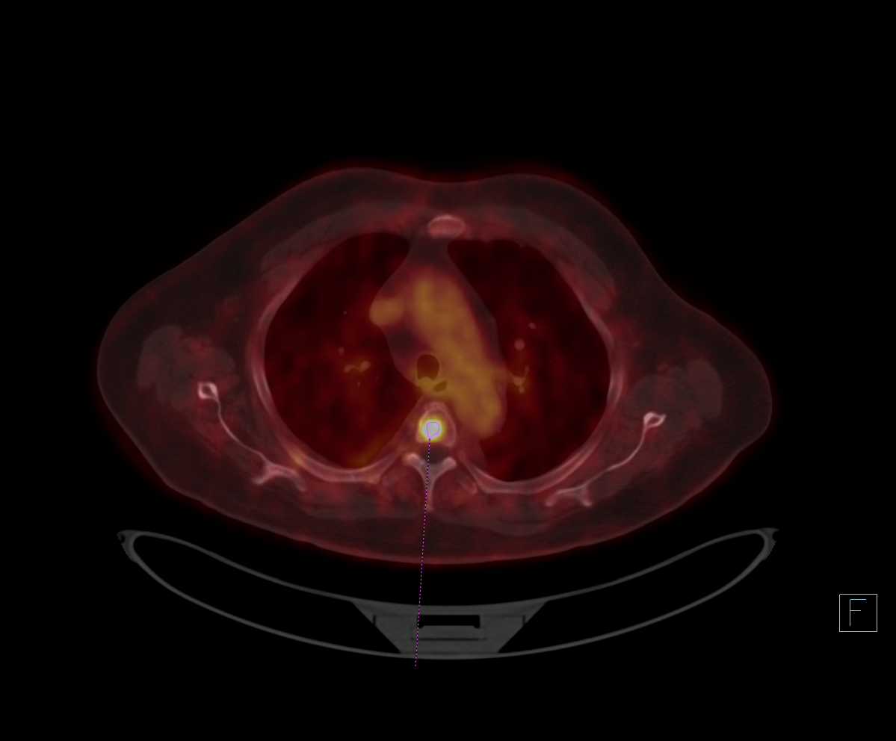
[im 4/7]
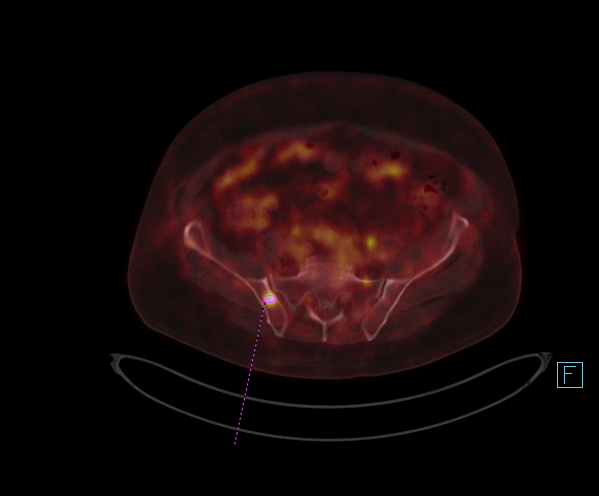
[im 5/7]
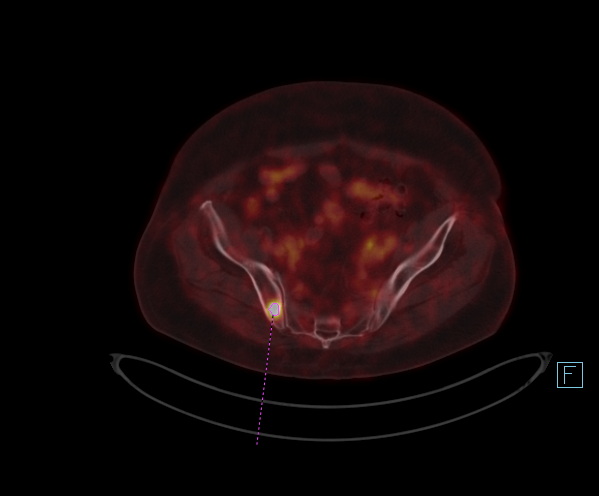
[im 6/7]
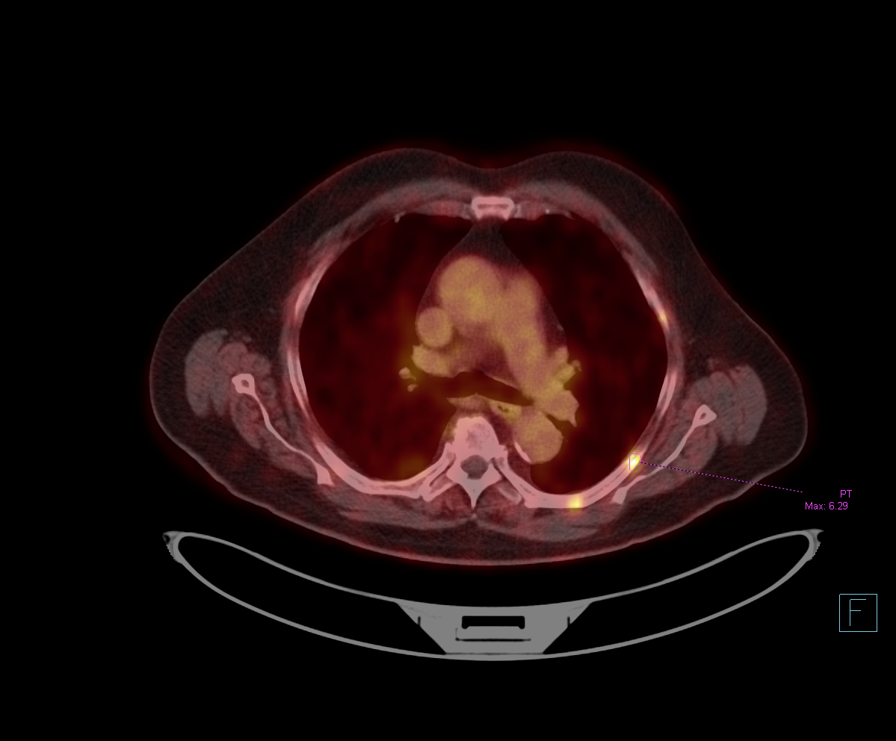
[im 7/7]
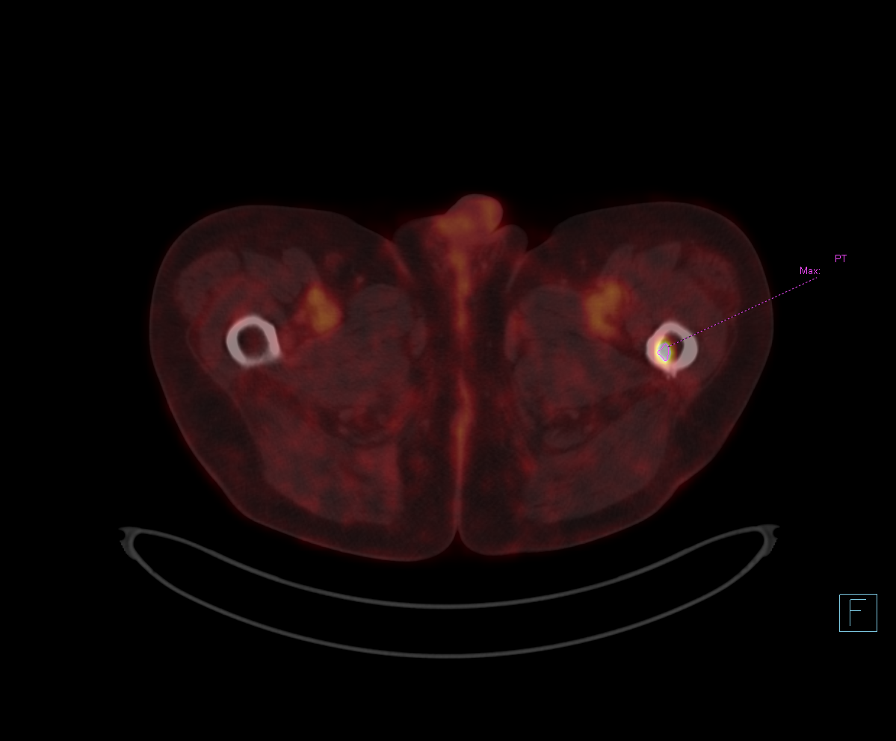

[Series 8145: results mm oncology reading · 5.0mm · 1.06mm/px · 1 of 1 slices shown (2 of 2)]
[im 1/1]
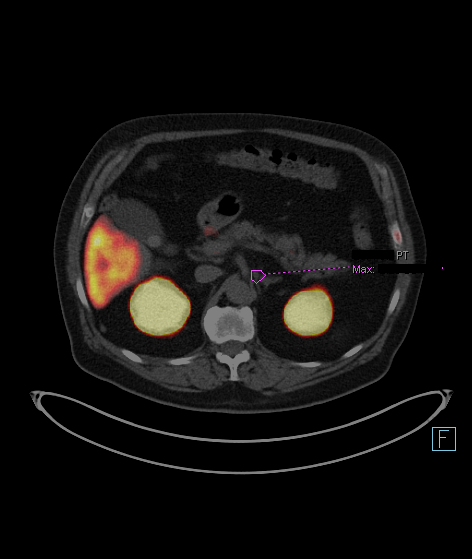

[8 of 8 positions shown; findings below may reference images not displayed]

FINDINGS: NECK

No radiotracer activity in neck lymph nodes.

Incidental CT finding: None

CHEST

No radiotracer accumulation within mediastinal or hilar lymph nodes.
No suspicious pulmonary nodules on the CT scan.

Incidental CT finding: Aortic atherosclerosis. No adenopathy by size
criteria in the chest. Mild cardiac enlargement without pericardial
effusion.

RIGHT basilar scarring or atelectasis.  Airways are patent.

ABDOMEN/PELVIS

Prostate: No focal activity in the prostate bed.

Lymph nodes: No abnormal radiotracer accumulation within pelvic or
abdominal nodes.

Liver: No evidence of liver metastasis

Incidental CT finding: Cholelithiasis without pericholecystic
stranding. Gallstones up to 1.5 cm.

No acute findings relative to pancreas, spleen, adrenal glands or of
the kidneys. Urinary bladder is collapsed. No acute gastrointestinal
process. Appendix is normal. Sigmoid diverticulosis and diverticular
disease.

SKELETON

Widespread skeletal metastatic disease.

(Image [DATE]) calvarial metastatic focus without sclerosis in the
RIGHT sphenoid with a maximum SUV of 5.2, area less than a cm based
on activity.

LEFT T1 transverse process (image 63/4) activity in this area with a
maximum SUV of 7.0, similar activity at the level of the LEFT T2
lamina without visible, corresponding lesion. T5 metastatic focus
with a maximum SUV of 17.85 (image 81/4) no corresponding CT
abnormality.

Lesions in the bilateral ribs, on the LEFT at the fourth rib
laterally and with 2 lesions along the posterior LEFT sixth rib.
Maximum SUV of 6.29 associated with rib lesions. RIGHT-sided lesion
in the RIGHT posterior fifth rib and another lesion in the RIGHT
posterior ninth rib

Lesions in the RIGHT iliac bone, maximum SUV of 14.05 on image 184/4
with an additional lesion in the more anterior RIGHT iliac with a
maximum SUV of 8.5 (image 175/4 small lesion suspected along the
RIGHT iliac crest on image 175/4 displays subtle radiotracer
accumulation. Moderate activity seen within a RIGHT femoral lesion,
this area measuring less than a cm and without corresponding CT
abnormality. Moderate to marked activity in the LEFT femoral lesion
(image 219/4) 14.54 maximum SUV this does not show corresponding
sclerosis on CT. Other scattered very small and subtle foci seen in
the spine, for instance on image 139/4, potentially related to
degenerative changes or would suggest attention on subsequent
imaging.

Similar but more focal areas in the RIGHT acetabulum with very faint
radiotracer accumulation (image 190/4) nonspecific and potentially
related to degenerative change.
IMPRESSION: Signs of widespread skeletal metastatic disease involving calvarium,
ribs, spine, pelvis and bilateral proximal femora.

Other more subtle areas of activity in the spine and RIGHT
acetabulum potentially related to underlying degenerative changes
but warranting attention on subsequent imaging.

No signs of solid organ or nodal metastases at this time.

Post prostatectomy without signs of radiotracer accumulation in the
prostatectomy bed.

## 2022-08-26 ENCOUNTER — Other Ambulatory Visit: Payer: Self-pay | Admitting: Hematology and Oncology

## 2022-08-26 DIAGNOSIS — I480 Paroxysmal atrial fibrillation: Secondary | ICD-10-CM

## 2022-08-27 ENCOUNTER — Other Ambulatory Visit (HOSPITAL_COMMUNITY): Payer: Self-pay

## 2022-09-09 ENCOUNTER — Ambulatory Visit (HOSPITAL_COMMUNITY)
Admission: RE | Admit: 2022-09-09 | Discharge: 2022-09-09 | Disposition: A | Discharge status: Home / Self Care | Payer: Medicare Other | Source: Ambulatory Visit | Attending: Hematology and Oncology | Admitting: Hematology and Oncology

## 2022-09-09 DIAGNOSIS — Z191 Hormone sensitive malignancy status: Secondary | ICD-10-CM | POA: Insufficient documentation

## 2022-09-09 DIAGNOSIS — R9721 Rising PSA following treatment for malignant neoplasm of prostate: Secondary | ICD-10-CM | POA: Diagnosis present

## 2022-09-09 DIAGNOSIS — C61 Malignant neoplasm of prostate: Secondary | ICD-10-CM | POA: Insufficient documentation

## 2022-09-09 DIAGNOSIS — K5730 Diverticulosis of large intestine without perforation or abscess without bleeding: Secondary | ICD-10-CM | POA: Diagnosis not present

## 2022-09-09 DIAGNOSIS — C7951 Secondary malignant neoplasm of bone: Secondary | ICD-10-CM | POA: Diagnosis not present

## 2022-09-09 DIAGNOSIS — K8020 Calculus of gallbladder without cholecystitis without obstruction: Secondary | ICD-10-CM | POA: Diagnosis not present

## 2022-09-09 DIAGNOSIS — N281 Cyst of kidney, acquired: Secondary | ICD-10-CM | POA: Diagnosis not present

## 2022-09-09 DIAGNOSIS — I700 Atherosclerosis of aorta: Secondary | ICD-10-CM | POA: Diagnosis not present

## 2022-09-09 DIAGNOSIS — J9811 Atelectasis: Secondary | ICD-10-CM | POA: Diagnosis not present

## 2022-09-09 LAB — POCT I-STAT CREATININE: Creatinine, Ser: 1.9 mg/dL — ABNORMAL HIGH (ref 0.61–1.24)

## 2022-09-09 MED ORDER — IOHEXOL 300 MG/ML  SOLN
100.0000 mL | Freq: Once | INTRAMUSCULAR | Status: AC | PRN
  Administered 2022-09-09: 100 mL via INTRAVENOUS

## 2022-09-10 ENCOUNTER — Telehealth: Payer: Self-pay | Admitting: *Deleted

## 2022-09-10 NOTE — Telephone Encounter (Signed)
Notified of message below

## 2022-09-10 NOTE — Telephone Encounter (Signed)
-----   Message from Orson Slick, MD sent at 09/10/2022  1:31 PM EST ----- Please let Justin Gibbs know that his CT scan shows stable prostate cancer. His treatment appears to be working well. We will discuss this further at his visit next week.   ----- Message ----- From: Interface, Rad Results In Sent: 09/10/2022  10:48 AM EST To: Orson Slick, MD

## 2022-09-16 ENCOUNTER — Other Ambulatory Visit: Payer: Self-pay | Admitting: Hematology and Oncology

## 2022-09-16 DIAGNOSIS — C61 Malignant neoplasm of prostate: Secondary | ICD-10-CM

## 2022-09-16 NOTE — Progress Notes (Unsigned)
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 Lorenso Courier  10/11/2020: started phebotomy q 2 weeks and hydroxyurea 522m BID 11/15/2020: WBC 9.0, Hgb 14.9, HCT 44.5%, Plt 384. Holding phlebotomies, patient at target.  02/21/2021: WBC 5.8, Hgb 15.1, Hct 42.8, Plt 208 08/17/2021: WBC 6.6, Hgb 14.3, MCV 110, Plt 228 04/15/2022: WBC 7.2, Hgb 14.3, HCT 39.5, MCV 107.9, Plt 283  #Hx of Prostate Cancer 2006: robotic resection of prostate.  11/16/2019: PSA 0.21([facility])  08/08/2020: PSA 0.36 ([facility]  11/28/2021: NM PSMA scan showed widespread skeletal metastatic disease involving calvarium, ribs, spine, pelvis and bilateral proximal femora 12/19/2021: first dose of leuprolide 22.5 mg IM 01/23/2022: added abiraterone 1000 mg with prednisone.  03/07/2022: PSMA scan showed similar to minimal improvement in osseous metastasis  Interval History:  RHYRUM CANTER82y.o. male with medical history significant for polycythemia vera who presents for a follow up visit. The patient's last visit was on 06/17/2022. In the interim since the last visit the patient has had no major changes in his health.  On exam today Mr. Meleski reports he has been well overall leaner full since her last visit.  He is disappointed by  his blood pressure this morning.  He reports that he did take his blood pressure medications this morning but that recently his blood pressure medication dosages were reduced due to "fatigue".  He notes that he was also concerned by the fact his creatinine was 1.9 at the time he had a CT scan performed and therefore he "drank a lot of water" in order to try to improve this.  He is taking his hydroxyurea pills faithfully with no side effects.  He denies any mouth ulcers, stomach upset, or ankle ulcers.  He is also taking his Zytiga first thing in the morning and reports no side effects other than some occasional hot flashes.  He reports that he "just bears it".  He notes that his side effects are not bad.  Overall he is willing and able to proceed with Lupron shots and hydroxyurea therapy at this time.   A full 10 point ROS is listed below.  MEDICAL HISTORY:  Past Medical History:  Diagnosis Date   Anxiety    Arthritis    Atrial fibrillation ([place] 06/2020   Cancer ([place]    prostate / removed 2006   Depression    Dysrhythmia    High triglycerides    Hypertension    Polycythemia vera, acquired ([place]     SURGICAL HISTORY: Past Surgical History:  Procedure Laterality Date   BIOPSY  08/27/2021   Procedure: BIOPSY;  Surgeon: CEloise Harman DO;  [facility];  Service: Endoscopy;;   COLONOSCOPY WITH PROPOFOL N/A 08/27/2021   Procedure: COLONOSCOPY WITH PROPOFOL;  Surgeon: CEloise Harman DO;  [facility];  Service: Endoscopy;  Laterality: N/A;  8:00am   HERNIA REPAIR Bilateral  x 2    POLYPECTOMY  08/27/2021   Procedure: POLYPECTOMY;  Surgeon: Eloise Harman, DO;  [facility];  Service: Endoscopy;;   PROSTATECTOMY      SOCIAL HISTORY: Social History   Socioeconomic History   Marital status: Married    Spouse name: Dawn   Number of children: 3   Years of education: Not on file   Highest education level: Not on file  Occupational History    Occupation: retired     Comment: 2007  Tobacco Use   Smoking status: Former    Packs/day: 1.00    Years: 23.00    Total pack years: 23.00    Types: Cigarettes, Cigars    Quit date: 06/12/1982    Years since quitting: 40.2   Smokeless tobacco: Never   Tobacco comments:    I was a light smoker  Vaping Use   Vaping Use: Never used  Substance and Sexual Activity   Alcohol use: Never   Drug use: Never   Sexual activity: Not Currently    Comment: Prostate removal  Nov. 15 2006  Other Topics Concern   Not on file  Social History Narrative   Lives with wife and son. He has a basement with a wood stove.   Social Determinants of Health   Financial Resource Strain: Low Risk  (02/01/2022)   Overall Financial Resource Strain (CARDIA)    Difficulty of Paying Living Expenses: Not hard at all  Food Insecurity: No Food Insecurity (02/01/2022)   Hunger Vital Sign    Worried About Running Out of Food in the Last Year: Never true    Ran Out of Food in the Last Year: Never true  Transportation Needs: No Transportation Needs (02/01/2022)   PRAPARE - Hydrologist (Medical): No    Lack of Transportation (Non-Medical): No  Physical Activity: Insufficiently Active (02/01/2022)   Exercise Vital Sign    Days of Exercise per Week: 7 days    Minutes of Exercise per Session: 20 min  Stress: No Stress Concern Present (02/01/2022)   [place]    Feeling of Stress : Not at all  Social Connections: Moderately Isolated (02/01/2022)   Social Connection and Isolation Panel [NHANES]    Frequency of Communication with Friends and Family: More than three times a week    Frequency of Social Gatherings with Friends and Family: More than three times a week    Attends Religious Services: Never    Marine scientist or Organizations: No    Attends Archivist Meetings: Never    Marital Status: Married  Arboriculturist Violence: Not At Risk (02/01/2022)   Humiliation, Afraid, Rape, and Kick questionnaire    Fear of Current or Ex-Partner: No    Emotionally Abused: No    Physically Abused: No    Sexually Abused: No    FAMILY HISTORY: Family History  Problem Relation Age of Onset   Alzheimer's disease Mother    Alcohol abuse Father    Cancer Father        leukemia   Cirrhosis Father    Stomach cancer Father    Stroke Brother    Diabetes Daughter    Cancer Maternal Grandmother    Cancer Paternal Grandfather    Prostate cancer Brother    Brain cancer Brother    Thyroid cancer Sister     ALLERGIES:  has No Known Allergies.  MEDICATIONS:  Current Outpatient Medications  Medication Sig Dispense Refill   abiraterone acetate (ZYTIGA) 250 MG tablet Take 4 tablets (1,000 mg total) by mouth daily. Take on an empty stomach 1 hour before or 2 hours after a meal 360 tablet 2   ELIQUIS 5 MG TABS tablet TAKE 1 TABLET BY MOUTH TWICE DAILY . APPOINTMENT REQUIRED FOR FUTURE REFILLS 60 tablet 0   hydroxyurea (HYDREA) 500 MG capsule Take 1 capsule (500 mg total) by mouth 2 (two) times daily. May take with food to minimize GI side effects. 180 capsule 1   lisinopril-hydrochlorothiazide (ZESTORETIC) 20-12.5 MG tablet Take 2 tablets by mouth daily. 180 tablet 1   metoprolol tartrate (LOPRESSOR) 25 MG tablet Take 1 tablet (25 mg total) by mouth 2 (two) times daily. 180 tablet 1   Multiple Vitamin (MULTIVITAMIN) capsule Take 1 capsule by mouth daily.     PARoxetine (PAXIL) 10 MG tablet Take 1/2 (one-half) tablet by mouth twice daily 30 tablet 0   predniSONE (DELTASONE) 5 MG tablet Take 1 tablet (5 mg total) by mouth daily with breakfast. 90 tablet 1   predniSONE (DELTASONE) 5 MG tablet Take 1 tablet (5 mg total) by mouth daily with breakfast. 90 tablet 1   No current facility-administered medications for this visit.    REVIEW OF SYSTEMS:   Constitutional: ( - ) fevers, ( - )  chills , ( - ) night  sweats Eyes: ( - ) blurriness of vision, ( - ) double vision, ( - ) watery eyes Ears, nose, mouth, throat, and face: ( - ) mucositis, ( - ) sore throat Respiratory: ( - ) cough, ( - ) dyspnea, ( - ) wheezes Cardiovascular: ( - ) palpitation, ( - ) chest discomfort, ( - ) lower extremity swelling Gastrointestinal:  ( - ) nausea, ( - ) heartburn, ( - ) change in bowel habits Skin: ( - ) abnormal skin rashes Lymphatics: ( - ) new lymphadenopathy, ( - ) easy bruising Neurological: ( - ) numbness, ( - ) tingling, ( - ) new weaknesses Behavioral/Psych: ( - ) mood change, ( - ) new changes  All other systems were reviewed with the patient and are negative.  PHYSICAL EXAMINATION:  Vitals:   09/17/22 1113  BP: (!) 171/97  Pulse: 64  Resp: 13  Temp: 98 F (36.7 C)  SpO2: 99%     Filed Weights   09/17/22 1113  Weight: 229 lb 12.8 oz (104.2 kg)      GENERAL: well appearing elderly Caucasian male alert, no distress and comfortable SKIN: skin color, texture, turgor are normal, no rashes or significant lesions EYES: conjunctiva are pink and non-injected, sclera clear LUNGS: clear to auscultation and percussion with normal breathing effort HEART: regular rate & rhythm and no murmurs and no lower extremity edema Musculoskeletal: no cyanosis of digits and no clubbing  PSYCH: alert & oriented x 3, fluent speech NEURO: no focal motor/sensory deficits  LABORATORY DATA:  I have reviewed the data as listed    Latest Ref Rng & Units 09/17/2022    9:51 AM 06/17/2022    1:46 PM 04/15/2022    1:39 PM  CBC  WBC 4.0 - 10.5 K/uL 9.6  7.0  7.2   Hemoglobin 13.0 - 17.0 g/dL 14.4  14.3  14.3   Hematocrit 39.0 - 52.0 % 39.9  40.3  39.5   Platelets 150 - 400 K/uL 313  275  283        Latest Ref  Rng & Units 09/17/2022    9:51 AM 09/09/2022    4:03 PM 06/17/2022    1:46 PM  CMP  Glucose 70 - 99 mg/dL 112   105   BUN 8 - 23 mg/dL 27   29   Creatinine 0.61 - 1.24 mg/dL 1.16  1.90  1.22   Sodium  135 - 145 mmol/L 141   141   Potassium 3.5 - 5.1 mmol/L 3.9   4.2   Chloride 98 - 111 mmol/L 101   102   CO2 22 - 32 mmol/L 33   34   Calcium 8.9 - 10.3 mg/dL 9.2   9.8   Total Protein 6.5 - 8.1 g/dL 6.8   7.0   Total Bilirubin 0.3 - 1.2 mg/dL 0.7   0.7   Alkaline Phos 38 - 126 U/L 53   52   AST 15 - 41 U/L 16   15   ALT 0 - 44 U/L 12   13     No results found for: "MPROTEIN" No results found for: "KPAFRELGTCHN", "LAMBDASER", "KAPLAMBRATIO"  Pathology:    RADIOGRAPHIC STUDIES: CT CHEST ABDOMEN PELVIS W CONTRAST  Result Date: 09/10/2022 CLINICAL DATA:  Metastatic prostate cancer. Restaging. * Tracking Code: BO * EXAM: CT CHEST, ABDOMEN, AND PELVIS WITH CONTRAST TECHNIQUE: Multidetector CT imaging of the chest, abdomen and pelvis was performed following the standard protocol during bolus administration of intravenous contrast. RADIATION DOSE REDUCTION: This exam was performed according to the departmental dose-optimization program which includes automated exposure control, adjustment of the mA and/or kV according to patient size and/or use of iterative reconstruction technique. CONTRAST:  167m OMNIPAQUE IOHEXOL 300 MG/ML  SOLN COMPARISON:  PET-CT 03/07/2022. FINDINGS: CT CHEST FINDINGS Cardiovascular: The heart size is normal. No substantial pericardial effusion. Coronary artery calcification is evident. Mild atherosclerotic calcification is noted in the wall of the thoracic aorta. Mediastinum/Nodes: No mediastinal lymphadenopathy. There is no hilar lymphadenopathy. The esophagus has normal imaging features. There is no axillary lymphadenopathy. Lungs/Pleura: 5 mm ground-glass nodule noted right upper lobe on 37/6. No suspicious pulmonary nodule or mass. No focal airspace consolidation. No pleural effusion. Atelectasis noted at the right base adjacent to the hemidiaphragm. Musculoskeletal: 11 mm lucent lesion in the T4 vertebral body is similar to prior. No new suspicious lytic or sclerotic  osseous abnormality. CT ABDOMEN PELVIS FINDINGS Hepatobiliary: Stable 8 mm hypoattenuating lesion medial segment left liver on 47/2. Multiple calcified gallstones again noted. No intrahepatic or extrahepatic biliary dilation. Pancreas: No focal mass lesion. No dilatation of the main duct. No intraparenchymal cyst. No peripancreatic edema. Spleen: No splenomegaly. No focal mass lesion. Adrenals/Urinary Tract: No adrenal nodule or mass. Stable tiny exophytic cyst upper pole right kidney. No followup imaging is recommended. Left kidney unremarkable. No evidence for hydroureter. The urinary bladder appears normal for the degree of distention. No discrete bladder wall mass or focal bladder wall thickening evident. Stomach/Bowel: Stomach is unremarkable. No gastric wall thickening. No evidence of outlet obstruction. Duodenum is normally positioned as is the ligament of Treitz. No small bowel wall thickening. No small bowel dilatation. The terminal ileum is normal. The appendix is normal. No gross colonic mass. No colonic wall thickening. Diverticular changes are noted in the left colon without evidence of diverticulitis. Vascular/Lymphatic: There is moderate atherosclerotic calcification of the abdominal aorta without aneurysm. There is no gastrohepatic or hepatoduodenal ligament lymphadenopathy. No retroperitoneal or mesenteric lymphadenopathy. No pelvic sidewall lymphadenopathy. Reproductive: Prostate gland surgically absent. Other: No intraperitoneal free fluid. Musculoskeletal:  Small sclerotic focus left ischial tuberosity is stable. No new suspicious lytic or sclerotic osseous abnormality. Multiple hypermetabolic bone lesions were seen on the previous PET-CT but were occult on CT imaging. IMPRESSION: 1. Stable exam. No new or progressive findings in the chest, abdomen, or pelvis to suggest new or progressive metastatic disease. 2. Multiple hypermetabolic bone lesions were seen on the previous PET-CT but were occult  on CT imaging. Lucent lesion in the T4 vertebral body is in the region of hypermetabolism on previous PET-CT but is unchanged since that time. 3. 5 mm ground-glass nodule right upper lobe. Unlikely to be metastatic, but not definitely seen on prior imaging. Attention on follow-up recommended. 4. Cholelithiasis. 5. Left colonic diverticulosis without diverticulitis. 6.  Aortic Atherosclerosis (ICD10-I70.0). Electronically Signed   By: Misty Stanley M.D.   On: 09/10/2022 10:46    ASSESSMENT & PLAN LEXIS PRENATT 82 y.o. male with medical history significant for polycythemia vera who presents for a follow up visit.   After review of the labs, the records, and discussion with the patient the findings most consistent with a polycythemia vera which is JAK2 V617 F positive.  These results were confirmed with a bone marrow biopsy.  Given the patient's age greater than 19 he is considered a high risk polycythemia vera and therefore would require cytoreductive therapy with hydroxyurea 500 mg twice daily in addition to every 2 week phlebotomies.  Goal hematocrit for this patient is less than 45%.  Additionally he is on Eliquis therapy which will serve as anticoagulation for thromboprophylaxis.  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  Patient at goal HCT goal of <45% --Today Hgb 14.4 with HCT 39.9% --patient currently at goal HCT, will hold further phlebotomies unless needed.  --holding ASA 20m PO daily as the patient is on eliquis.  -- Continue to monitor while being treated for prostate cancer as below.  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  --11/27/2021: widespread metastatic disease noted on PSMA scan.  -- underwent radiation therapy, completed this on  01/31/2021. 12/19/2021: first dose of leuprolide 22.5 mg IM Plan: -- NM PSMA scan Feb 2024 showed stable disease. Next imaging in August 2024 -- continue q 3 month lupron shots. Administered today, Next due May 2024.  --no indication for zometa therapy at this time --continue zytiga 1000 mg PO daily with 5 mg PO prednisone PO daily.  --PSA and testosterone are castrate <3 at last check with PSA <1.0  --RTC in 3 month to re-evaluate.   No orders of the defined types were placed in this encounter.   All questions were answered. The patient knows to call the clinic with any problems, questions or concerns.  A total of more than 30 minutes were spent on this encounter and over half of that time was spent on counseling and coordination of care as outlined above.   JLedell Peoples MD Department of Hematology/Oncology [facility]Phone: [phone]: [phone]: [email or web address]  09/17/2022 11:22 AM

## 2022-09-17 ENCOUNTER — Inpatient Hospital Stay: Payer: Medicare Other | Attending: Hematology and Oncology

## 2022-09-17 ENCOUNTER — Inpatient Hospital Stay: Payer: Medicare Other

## 2022-09-17 ENCOUNTER — Inpatient Hospital Stay (HOSPITAL_BASED_OUTPATIENT_CLINIC_OR_DEPARTMENT_OTHER): Payer: Medicare Other | Admitting: Hematology and Oncology

## 2022-09-17 VITALS — BP 171/97 | HR 64 | Temp 98.0°F | Resp 13 | Wt 229.8 lb

## 2022-09-17 DIAGNOSIS — Z79899 Other long term (current) drug therapy: Secondary | ICD-10-CM | POA: Diagnosis not present

## 2022-09-17 DIAGNOSIS — Z79818 Long term (current) use of other agents affecting estrogen receptors and estrogen levels: Secondary | ICD-10-CM | POA: Diagnosis present

## 2022-09-17 DIAGNOSIS — D45 Polycythemia vera: Secondary | ICD-10-CM | POA: Diagnosis not present

## 2022-09-17 DIAGNOSIS — D75839 Thrombocytosis, unspecified: Secondary | ICD-10-CM

## 2022-09-17 DIAGNOSIS — Z923 Personal history of irradiation: Secondary | ICD-10-CM | POA: Diagnosis not present

## 2022-09-17 DIAGNOSIS — C7951 Secondary malignant neoplasm of bone: Secondary | ICD-10-CM | POA: Insufficient documentation

## 2022-09-17 DIAGNOSIS — C61 Malignant neoplasm of prostate: Secondary | ICD-10-CM | POA: Diagnosis not present

## 2022-09-17 DIAGNOSIS — Z87891 Personal history of nicotine dependence: Secondary | ICD-10-CM | POA: Insufficient documentation

## 2022-09-17 DIAGNOSIS — Z7901 Long term (current) use of anticoagulants: Secondary | ICD-10-CM | POA: Insufficient documentation

## 2022-09-17 LAB — CMP (CANCER CENTER ONLY)
ALT: 12 U/L (ref 0–44)
AST: 16 U/L (ref 15–41)
Albumin: 4.1 g/dL (ref 3.5–5.0)
Alkaline Phosphatase: 53 U/L (ref 38–126)
Anion gap: 7 (ref 5–15)
BUN: 27 mg/dL — ABNORMAL HIGH (ref 8–23)
CO2: 33 mmol/L — ABNORMAL HIGH (ref 22–32)
Calcium: 9.2 mg/dL (ref 8.9–10.3)
Chloride: 101 mmol/L (ref 98–111)
Creatinine: 1.16 mg/dL (ref 0.61–1.24)
GFR, Estimated: >60 mL/min (ref >60)
Glucose, Bld: 112 mg/dL — ABNORMAL HIGH (ref 70–99)
Potassium: 3.9 mmol/L (ref 3.5–5.1)
Sodium: 141 mmol/L (ref 135–145)
Total Bilirubin: 0.7 mg/dL (ref 0.3–1.2)
Total Protein: 6.8 g/dL (ref 6.5–8.1)

## 2022-09-17 LAB — CBC WITH DIFFERENTIAL (CANCER CENTER ONLY)
Abs Immature Granulocytes: 0.04 10*3/uL (ref 0.00–0.07)
Basophils Absolute: 0.1 10*3/uL (ref 0.0–0.1)
Basophils Relative: 1 %
Eosinophils Absolute: 0.1 10*3/uL (ref 0.0–0.5)
Eosinophils Relative: 1 %
HCT: 39.9 % (ref 39.0–52.0)
Hemoglobin: 14.4 g/dL (ref 13.0–17.0)
Immature Granulocytes: 0 %
Lymphocytes Relative: 8 %
Lymphs Abs: 0.8 10*3/uL (ref 0.7–4.0)
MCH: 39.3 pg — ABNORMAL HIGH (ref 26.0–34.0)
MCHC: 36.1 g/dL — ABNORMAL HIGH (ref 30.0–36.0)
MCV: 109 fL — ABNORMAL HIGH (ref 80.0–100.0)
Monocytes Absolute: 0.6 10*3/uL (ref 0.1–1.0)
Monocytes Relative: 7 %
Neutro Abs: 8 10*3/uL — ABNORMAL HIGH (ref 1.7–7.7)
Neutrophils Relative %: 83 %
Platelet Count: 313 10*3/uL (ref 150–400)
RBC: 3.66 MIL/uL — ABNORMAL LOW (ref 4.22–5.81)
RDW: 12.9 % (ref 11.5–15.5)
WBC Count: 9.6 10*3/uL (ref 4.0–10.5)
nRBC: 0 % (ref 0.0–0.2)

## 2022-09-17 MED ORDER — LEUPROLIDE ACETATE (3 MONTH) 22.5 MG ~~LOC~~ KIT
22.5000 mg | PACK | Freq: Once | SUBCUTANEOUS | Status: AC
  Administered 2022-09-17: 22.5 mg via SUBCUTANEOUS
  Filled 2022-09-17: qty 22.5

## 2022-09-17 NOTE — Patient Instructions (Signed)
Leuprolide Suspension for Injection (Prostate Cancer) What is this medication? LEUPROLIDE (loo PROE lide) reduces the symptoms of prostate cancer. It works by decreasing levels of the hormone testosterone in the body. This prevents prostate cancer cells from spreading or growing. This medicine may be used for other purposes; ask your health care provider or pharmacist if you have questions. COMMON BRAND NAME(S): Eligard, Lupron Depot, Lupron Depot-Ped, Lutrate Depot, Viadur What should I tell my care team before I take this medication? They need to know if you have any of these conditions: Diabetes Heart disease Heart failure High or low levels of electrolytes, such as magnesium, potassium, or sodium in your blood Irregular heartbeat or rhythm Seizures An unusual or allergic reaction to leuprolide, other medications, foods, dyes, or preservatives Pregnant or trying to get pregnant Breast-feeding How should I use this medication? This medication is injected under the skin or into a muscle. It is given by your care team in a hospital or clinic setting. Talk to your care team about the use of this medication in children. Special care may be needed. Overdosage: If you think you have taken too much of this medicine contact a poison control center or emergency room at once. NOTE: This medicine is only for you. Do not share this medicine with others. What if I miss a dose? Keep appointments for follow-up doses. It is important not to miss your dose. Call your care team if you are unable to keep an appointment. What may interact with this medication? Do not take this medication with any of the following: Cisapride Dronedarone Ketoconazole Levoketoconazole Pimozide Thioridazine This medication may also interact with the following: Other medications that cause heart rhythm changes This list may not describe all possible interactions. Give your health care provider a list of all the medicines,  herbs, non-prescription drugs, or dietary supplements you use. Also tell them if you smoke, drink alcohol, or use illegal drugs. Some items may interact with your medicine. What should I watch for while using this medication? Visit your care team for regular checks on your progress. Tell your care team if your symptoms do not start to get better or if they get worse. This medication may increase blood sugar. The risk may be higher in patients who already have diabetes. Ask your care team what you can do to lower the risk of diabetes while taking this medication. This medication may cause infertility. Talk to your care team if you are concerned about your fertility. Heart attacks and strokes have been reported with the use of this medication. Get emergency help if you develop signs or symptoms of a heart attack or stroke. Talk to your care team about the risks and benefits of this medication. What side effects may I notice from receiving this medication? Side effects that you should report to your care team as soon as possible: Allergic reactions--skin rash, itching, hives, swelling of the face, lips, tongue, or throat Heart attack--pain or tightness in the chest, shoulders, arms, or jaw, nausea, shortness of breath, cold or clammy skin, feeling faint or lightheaded Heart rhythm changes--fast or irregular heartbeat, dizziness, feeling faint or lightheaded, chest pain, trouble breathing High blood sugar (hyperglycemia)--increased thirst or amount of urine, unusual weakness or fatigue, blurry vision Mood swings, irritability, hostility Seizures Stroke--sudden numbness or weakness of the face, arm, or leg, trouble speaking, confusion, trouble walking, loss of balance or coordination, dizziness, severe headache, change in vision Thoughts of suicide or self-harm, worsening mood, feelings of depression Side  effects that usually do not require medical attention (report to your care team if they continue or  are bothersome): Bone pain Change in sex drive or performance General discomfort and fatigue Hot flashes Muscle pain Pain, redness, or irritation at injection site Swelling of the ankles, hands, or feet This list may not describe all possible side effects. Call your doctor for medical advice about side effects. You may report side effects to FDA at 1-800-FDA-1088. Where should I keep my medication? This medication is given in a hospital or clinic. It will not be stored at home. NOTE: This sheet is a summary. It may not cover all possible information. If you have questions about this medicine, talk to your doctor, pharmacist, or health care provider.  2023 Elsevier/Gold Standard (2021-09-26 00:00:00)

## 2022-09-18 ENCOUNTER — Telehealth: Payer: Self-pay | Admitting: Hematology and Oncology

## 2022-09-18 LAB — TESTOSTERONE: Testosterone: <3 ng/dL — ABNORMAL LOW (ref 264–916)

## 2022-09-18 LAB — PROSTATE-SPECIFIC AG, SERUM (LABCORP): Prostate Specific Ag, Serum: 0.2 ng/mL (ref 0.0–4.0)

## 2022-09-18 NOTE — Telephone Encounter (Signed)
Called patient per 2/20 los notes to schedule f/u. Patient scheduled and notified.

## 2022-09-20 ENCOUNTER — Other Ambulatory Visit: Payer: Self-pay

## 2022-09-20 ENCOUNTER — Other Ambulatory Visit (HOSPITAL_COMMUNITY): Payer: Self-pay

## 2022-09-22 ENCOUNTER — Other Ambulatory Visit: Payer: Self-pay | Admitting: Physician Assistant

## 2022-09-22 DIAGNOSIS — I480 Paroxysmal atrial fibrillation: Secondary | ICD-10-CM

## 2022-09-23 ENCOUNTER — Other Ambulatory Visit: Payer: Self-pay

## 2022-10-17 ENCOUNTER — Other Ambulatory Visit (HOSPITAL_COMMUNITY): Payer: Self-pay

## 2022-10-21 ENCOUNTER — Other Ambulatory Visit: Payer: Self-pay | Admitting: Physician Assistant

## 2022-10-21 ENCOUNTER — Other Ambulatory Visit: Payer: Self-pay

## 2022-10-21 DIAGNOSIS — I480 Paroxysmal atrial fibrillation: Secondary | ICD-10-CM

## 2022-11-06 ENCOUNTER — Encounter: Payer: Self-pay | Admitting: Hematology and Oncology

## 2022-11-06 ENCOUNTER — Telehealth: Payer: Self-pay | Admitting: Pharmacy Technician

## 2022-11-06 ENCOUNTER — Other Ambulatory Visit (HOSPITAL_COMMUNITY): Payer: Self-pay

## 2022-11-06 NOTE — Telephone Encounter (Signed)
Oral Oncology Patient Advocate Encounter   Was successful in securing patient an $3,250 grant from [employer] [facility]) to provide copayment coverage for Abiraterone.  This will keep the out of pocket expense at $0.     I have spoken with the patient.    The billing information is as follows and has been shared with [facility].   Member ID: [id number] Group ID: [id number] RxBin: [id number] Dates of Eligibility: 11/06/22 through 11/04/23  Fund:  Prostate  Jinger Neighbors, CPhT-Adv Oncology Pharmacy Patient Advocate [facility] Direct Number: [phone]  Fax: [phone]

## 2022-11-15 ENCOUNTER — Other Ambulatory Visit: Payer: Self-pay

## 2022-11-20 ENCOUNTER — Other Ambulatory Visit (HOSPITAL_COMMUNITY): Payer: Self-pay

## 2022-11-25 ENCOUNTER — Other Ambulatory Visit: Payer: Self-pay | Admitting: Hematology and Oncology

## 2022-11-25 DIAGNOSIS — I480 Paroxysmal atrial fibrillation: Secondary | ICD-10-CM

## 2022-12-03 ENCOUNTER — Other Ambulatory Visit (HOSPITAL_COMMUNITY): Payer: Self-pay

## 2022-12-05 DIAGNOSIS — L728 Other follicular cysts of the skin and subcutaneous tissue: Secondary | ICD-10-CM | POA: Diagnosis not present

## 2022-12-10 ENCOUNTER — Other Ambulatory Visit (HOSPITAL_COMMUNITY): Payer: Self-pay

## 2022-12-12 DIAGNOSIS — R208 Other disturbances of skin sensation: Secondary | ICD-10-CM | POA: Diagnosis not present

## 2022-12-12 DIAGNOSIS — L723 Sebaceous cyst: Secondary | ICD-10-CM | POA: Diagnosis not present

## 2022-12-12 DIAGNOSIS — L728 Other follicular cysts of the skin and subcutaneous tissue: Secondary | ICD-10-CM | POA: Diagnosis not present

## 2022-12-12 DIAGNOSIS — L538 Other specified erythematous conditions: Secondary | ICD-10-CM | POA: Diagnosis not present

## 2022-12-16 ENCOUNTER — Other Ambulatory Visit: Payer: Self-pay | Admitting: Physician Assistant

## 2022-12-16 DIAGNOSIS — C61 Malignant neoplasm of prostate: Secondary | ICD-10-CM

## 2022-12-17 ENCOUNTER — Inpatient Hospital Stay: Payer: Medicare Other | Attending: Hematology and Oncology

## 2022-12-17 ENCOUNTER — Inpatient Hospital Stay (HOSPITAL_BASED_OUTPATIENT_CLINIC_OR_DEPARTMENT_OTHER): Payer: Medicare Other | Admitting: Physician Assistant

## 2022-12-17 ENCOUNTER — Inpatient Hospital Stay: Payer: Medicare Other

## 2022-12-17 VITALS — BP 158/91 | HR 83 | Temp 97.6°F | Resp 18 | Wt 229.1 lb

## 2022-12-17 DIAGNOSIS — C61 Malignant neoplasm of prostate: Secondary | ICD-10-CM

## 2022-12-17 DIAGNOSIS — D45 Polycythemia vera: Secondary | ICD-10-CM | POA: Insufficient documentation

## 2022-12-17 DIAGNOSIS — C7951 Secondary malignant neoplasm of bone: Secondary | ICD-10-CM | POA: Diagnosis not present

## 2022-12-17 DIAGNOSIS — Z79818 Long term (current) use of other agents affecting estrogen receptors and estrogen levels: Secondary | ICD-10-CM | POA: Insufficient documentation

## 2022-12-17 DIAGNOSIS — Z87891 Personal history of nicotine dependence: Secondary | ICD-10-CM | POA: Insufficient documentation

## 2022-12-17 DIAGNOSIS — Z79899 Other long term (current) drug therapy: Secondary | ICD-10-CM | POA: Insufficient documentation

## 2022-12-17 LAB — CBC WITH DIFFERENTIAL (CANCER CENTER ONLY)
Abs Immature Granulocytes: 0.17 10*3/uL — ABNORMAL HIGH (ref 0.00–0.07)
Basophils Absolute: 0 10*3/uL (ref 0.0–0.1)
Basophils Relative: 0 %
Eosinophils Absolute: 0.1 10*3/uL (ref 0.0–0.5)
Eosinophils Relative: 1 %
HCT: 40 % (ref 39.0–52.0)
Hemoglobin: 14.2 g/dL (ref 13.0–17.0)
Immature Granulocytes: 2 %
Lymphocytes Relative: 8 %
Lymphs Abs: 0.7 10*3/uL (ref 0.7–4.0)
MCH: 38.1 pg — ABNORMAL HIGH (ref 26.0–34.0)
MCHC: 35.5 g/dL (ref 30.0–36.0)
MCV: 107.2 fL — ABNORMAL HIGH (ref 80.0–100.0)
Monocytes Absolute: 0.5 10*3/uL (ref 0.1–1.0)
Monocytes Relative: 6 %
Neutro Abs: 7.3 10*3/uL (ref 1.7–7.7)
Neutrophils Relative %: 83 %
Platelet Count: 293 10*3/uL (ref 150–400)
RBC: 3.73 MIL/uL — ABNORMAL LOW (ref 4.22–5.81)
RDW: 12.9 % (ref 11.5–15.5)
WBC Count: 8.8 10*3/uL (ref 4.0–10.5)
nRBC: 0 % (ref 0.0–0.2)

## 2022-12-17 LAB — CMP (CANCER CENTER ONLY)
ALT: 13 U/L (ref 0–44)
AST: 16 U/L (ref 15–41)
Albumin: 4.1 g/dL (ref 3.5–5.0)
Alkaline Phosphatase: 55 U/L (ref 38–126)
Anion gap: 7 (ref 5–15)
BUN: 32 mg/dL — ABNORMAL HIGH (ref 8–23)
CO2: 34 mmol/L — ABNORMAL HIGH (ref 22–32)
Calcium: 9.4 mg/dL (ref 8.9–10.3)
Chloride: 100 mmol/L (ref 98–111)
Creatinine: 1.25 mg/dL — ABNORMAL HIGH (ref 0.61–1.24)
GFR, Estimated: 59 mL/min — ABNORMAL LOW (ref >60)
Glucose, Bld: 120 mg/dL — ABNORMAL HIGH (ref 70–99)
Potassium: 3.5 mmol/L (ref 3.5–5.1)
Sodium: 141 mmol/L (ref 135–145)
Total Bilirubin: 0.9 mg/dL (ref 0.3–1.2)
Total Protein: 6.8 g/dL (ref 6.5–8.1)

## 2022-12-17 MED ORDER — LEUPROLIDE ACETATE (3 MONTH) 22.5 MG ~~LOC~~ KIT
22.5000 mg | PACK | Freq: Once | SUBCUTANEOUS | Status: AC
  Administered 2022-12-17: 22.5 mg via SUBCUTANEOUS
  Filled 2022-12-17: qty 22.5

## 2022-12-17 NOTE — Progress Notes (Signed)
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 Leonides Schanz  10/11/2020: started phebotomy q 2 weeks and hydroxyurea 500mg  BID 11/15/2020: WBC 9.0, Hgb 14.9, HCT 44.5%, Plt 384. Holding phlebotomies, patient at target.  02/21/2021: WBC 5.8, Hgb 15.1, Hct 42.8, Plt 208 08/17/2021: WBC 6.6, Hgb 14.3, MCV 110, Plt 228 04/15/2022: WBC 7.2, Hgb 14.3, HCT 39.5, MCV 107.9, Plt 283  #Hx of Prostate Cancer 2006: robotic resection of prostate.  11/16/2019: PSA 0.21([facility])  08/08/2020: PSA 0.36 [facility])  11/28/2021: NM PSMA scan showed widespread skeletal metastatic disease involving calvarium, ribs, spine, pelvis and bilateral proximal femora 12/19/2021: first dose of leuprolide 22.5 mg IM 01/23/2022: added abiraterone 1000 mg with prednisone.  03/07/2022: PSMA scan showed similar to minimal improvement in osseous metastasis  Interval History:  Justin Gibbs 82 y.o. male with medical history significant for polycythemia vera and prostate cancer who presents for a follow up visit. The patient's last visit was on 09/17/2022 with Dr. Leonides Schanz. In the interim since the last visit the patient has had no major changes in his health.  On exam today Justin Gibbs reports that he feel recently when he was working on his  shower and landed on his right arm. He experienced some rib pain and bruising which has nearly resolved. He denies any issues with ambulation of his upper extremities after the fall. He is otherwise feeling well. His appetite and energy are stable. He denies nausea, vomiting or abdominal pain. His bowel habits are unchanged without recurrent episodes of diarrhea or constipation. He is taking is hydroxyurea pills as prescribed.He is also taking his Zytiga first thing in the morning and reports no side effects other than some occasional hot flashes.  He denies fevers, chills, sweats, shortness of breath, chest pain, cough, oral mucositis, headaches or dizziness. Overall he is willing and able to proceed with Lupron shots and hydroxyurea therapy at this time.   A full 10 point ROS is listed below.  MEDICAL HISTORY:  Past Medical History:  Diagnosis Date   Anxiety    Arthritis    Atrial fibrillation (HCC) 06/2020   Cancer (HCC)    prostate / removed 2006   Depression    Dysrhythmia    High triglycerides    Hypertension    Polycythemia vera, acquired (HCC)     SURGICAL HISTORY: Past Surgical History:  Procedure Laterality Date   BIOPSY  08/27/2021   Procedure: BIOPSY;  Surgeon: Lanelle Bal, DO;  [facility]: [facility];  Service: Endoscopy;;   COLONOSCOPY WITH PROPOFOL N/A 08/27/2021   Procedure: COLONOSCOPY WITH PROPOFOL;  Surgeon: Lanelle Bal, DO;  Location: [facility];  Service: Endoscopy;  Laterality: N/A;  8:00am   HERNIA REPAIR Bilateral    x 2    POLYPECTOMY  08/27/2021   Procedure: POLYPECTOMY;  Surgeon: Lanelle Bal, DO;  Location: [facility];  Service:  Endoscopy;;   PROSTATECTOMY      SOCIAL HISTORY: Social History   Socioeconomic History   Marital status: Married    Spouse name: Dawn   Number of children: 3   Years of education: Not on file   Highest education level: Not on file  Occupational History   Occupation: retired     Comment: 2007  Tobacco  Use   Smoking status: Former    Packs/day: 1.00    Years: 23.00    Additional pack years: 0.00    Total pack years: 23.00    Types: Cigarettes, Cigars    Quit date: 06/12/1982    Years since quitting: 40.5   Smokeless tobacco: Never   Tobacco comments:    I was a light smoker  Vaping Use   Vaping Use: Never used  Substance and Sexual Activity   Alcohol use: Never   Drug use: Never   Sexual activity: Not Currently    Comment: Prostate removal  Nov. 15 2006  Other Topics Concern   Not on file  Social History Narrative   Lives with wife and son. He has a basement with a wood stove.   Social Determinants of Health   Financial Resource Strain: Low Risk  (02/01/2022)   Overall Financial Resource Strain (CARDIA)    Difficulty of Paying Living Expenses: Not hard at all  Food Insecurity: No Food Insecurity (02/01/2022)   Hunger Vital Sign    Worried About Running Out of Food in the Last Year: Never true    Ran Out of Food in the Last Year: Never true  Transportation Needs: No Transportation Needs (02/01/2022)   PRAPARE - Administrator, Civil Service (Medical): No    Lack of Transportation (Non-Medical): No  Physical Activity: Insufficiently Active (02/01/2022)   Exercise Vital Sign    Days of Exercise per Week: 7 days    Minutes of Exercise per Session: 20 min  Stress: No Stress Concern Present (02/01/2022)   [employer] of Occupational Health - Occupational Stress Questionnaire    Feeling of Stress : Not at all  Social Connections: Moderately Isolated (02/01/2022)   Social Connection and Isolation Panel [NHANES]    Frequency of Communication with Friends and Family: More than three times a week    Frequency of Social Gatherings with Friends and Family: More than three times a week    Attends Religious Services: Never    Database administrator or Organizations: No    Attends Banker Meetings: Never    Marital Status: Married  Catering manager Violence:  Not At Risk (02/01/2022)   Humiliation, Afraid, Rape, and Kick questionnaire    Fear of Current or Ex-Partner: No    Emotionally Abused: No    Physically Abused: No    Sexually Abused: No    FAMILY HISTORY: Family History  Problem Relation Age of Onset   Alzheimer's disease Mother    Alcohol abuse Father    Cancer Father        leukemia   Cirrhosis Father    Stomach cancer Father    Stroke Brother    Diabetes Daughter    Cancer Maternal Grandmother    Cancer Paternal Grandfather    Prostate cancer Brother    Brain cancer Brother    Thyroid cancer Sister     ALLERGIES:  has No Known Allergies.  MEDICATIONS:  Current Outpatient Medications  Medication Sig Dispense Refill   abiraterone acetate (ZYTIGA) 250 MG tablet  Take 4 tablets (1,000 mg total) by mouth daily. Take on an empty stomach 1 hour before or 2 hours after a meal 360 tablet 2   ELIQUIS 5 MG TABS tablet TAKE 1 TABLET BY MOUTH TWICE DAILY . APPOINTMENT REQUIRED FOR FUTURE REFILLS 60 tablet 0   hydroxyurea (HYDREA) 500 MG capsule Take 1 capsule (500 mg total) by mouth 2 (two) times daily. May take with food to minimize GI side effects. 180 capsule 1   lisinopril-hydrochlorothiazide (ZESTORETIC) 20-12.5 MG tablet Take 2 tablets by mouth daily. 180 tablet 1   metoprolol tartrate (LOPRESSOR) 25 MG tablet Take 1 tablet (25 mg total) by mouth 2 (two) times daily. 180 tablet 1   Multiple Vitamin (MULTIVITAMIN) capsule Take 1 capsule by mouth daily.     PARoxetine (PAXIL) 10 MG tablet Take 1/2 (one-half) tablet by mouth twice daily 30 tablet 0   predniSONE (DELTASONE) 5 MG tablet Take 1 tablet (5 mg total) by mouth daily with breakfast. 90 tablet 1   predniSONE (DELTASONE) 5 MG tablet Take 1 tablet (5 mg total) by mouth daily with breakfast. 90 tablet 1   No current facility-administered medications for this visit.    REVIEW OF SYSTEMS:   Constitutional: ( - ) fevers, ( - )  chills , ( - ) night sweats Eyes: ( - )  blurriness of vision, ( - ) double vision, ( - ) watery eyes Ears, nose, mouth, throat, and face: ( - ) mucositis, ( - ) sore throat Respiratory: ( - ) cough, ( - ) dyspnea, ( - ) wheezes Cardiovascular: ( - ) palpitation, ( - ) chest discomfort, ( - ) lower extremity swelling Gastrointestinal:  ( - ) nausea, ( - ) heartburn, ( - ) change in bowel habits Skin: ( - ) abnormal skin rashes Lymphatics: ( - ) new lymphadenopathy, ( - ) easy bruising Neurological: ( - ) numbness, ( - ) tingling, ( - ) new weaknesses Behavioral/Psych: ( - ) mood change, ( - ) new changes  All other systems were reviewed with the patient and are negative.  PHYSICAL EXAMINATION:  Vitals:   12/17/22 1038  BP: (!) 158/91  Pulse: 83  Resp: 18  Temp: 97.6 F (36.4 C)  SpO2: 99%    Filed Weights   12/17/22 1038  Weight: 229 lb 1.6 oz (103.9 kg)    GENERAL: well appearing elderly Caucasian male alert, no distress and comfortable SKIN: skin color, texture, turgor are normal, no rashes or significant lesions EYES: conjunctiva are pink and non-injected, sclera clear LUNGS: clear to auscultation and percussion with normal breathing effort HEART: regular rate & rhythm and no murmurs. Trace bilateral lower extremity edema Musculoskeletal: no cyanosis of digits and no clubbing  PSYCH: alert & oriented x 3, fluent speech NEURO: no focal motor/sensory deficits  LABORATORY DATA:  I have reviewed the data as listed    Latest Ref Rng & Units 12/17/2022    9:53 AM 09/17/2022    9:51 AM 06/17/2022    1:46 PM  CBC  WBC 4.0 - 10.5 K/uL 8.8  9.6  7.0   Hemoglobin 13.0 - 17.0 g/dL 16.1  09.6  04.5   Hematocrit 39.0 - 52.0 % 40.0  39.9  40.3   Platelets 150 - 400 K/uL 293  313  275        Latest Ref Rng & Units 12/17/2022    9:53 AM 09/17/2022    9:51 AM 09/09/2022    4:03 PM  CMP  Glucose 70 - 99 mg/dL 161  096    BUN 8 - 23 mg/dL 32  27    Creatinine 0.45 - 1.24 mg/dL 4.09  8.11  9.14   Sodium 135 - 145 mmol/L  141  141    Potassium 3.5 - 5.1 mmol/L 3.5  3.9    Chloride 98 - 111 mmol/L 100  101    CO2 22 - 32 mmol/L 34  33    Calcium 8.9 - 10.3 mg/dL 9.4  9.2    Total Protein 6.5 - 8.1 g/dL 6.8  6.8    Total Bilirubin 0.3 - 1.2 mg/dL 0.9  0.7    Alkaline Phos 38 - 126 U/L 55  53    AST 15 - 41 U/L 16  16    ALT 0 - 44 U/L 13  12      No results found for: "MPROTEIN" No results found for: "KPAFRELGTCHN", "LAMBDASER", "KAPLAMBRATIO"  Pathology:    RADIOGRAPHIC STUDIES: No results found.  ASSESSMENT & PLAN PRINCEMICHAEL GRAVIER 82 y.o. male with medical history significant for polycythemia vera who presents for a follow up visit.   After review of the labs, the records, and discussion with the patient the findings most consistent with a polycythemia vera which is JAK2 V617 F positive.  These results were confirmed with a bone marrow biopsy.  Given the patient's age greater than 2 he is considered a high risk polycythemia vera and therefore would require cytoreductive therapy with hydroxyurea 500 mg twice daily in addition to every 2 week phlebotomies.  Goal hematocrit for this patient is less than 45%.  Additionally he is on Eliquis therapy which will serve as anticoagulation for thromboprophylaxis.  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  Patient at goal HCT goal of <45% --Today Hgb 14.2 with HCT 40.0% --patient currently at goal HCT, will hold further phlebotomies unless needed.  --holding ASA 81mg  PO daily as the patient is on eliquis.  -- Continue to monitor while being treated for prostate cancer as below.  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  --11/27/2021: widespread metastatic disease noted on PSMA scan.  --Underwent radiation therapy, completed  this on 01/31/2021. 12/19/2021: first dose of leuprolide 22.5 mg IM -- Most recent NM PSMA scan Feb 2024 showed stable disease. Next imaging in August 2024 Plan: -- Currently received lupron injections q 3 months. Administered today, Next due August 2024.  --no indication for zometa therapy at this time --continue zytiga 1000 mg PO daily with 5 mg PO prednisone PO daily.  --PSA and testosterone are castrate <3 at last check with PSA was 0.2.  --RTC in 3 month to re-evaluate.   No orders of the defined types were placed in this encounter.   All questions were answered. The patient knows to call the clinic with any problems, questions or concerns.  I have spent a total of 30 minutes minutes of face-to-face and non-face-to-face time, preparing to see the patient, performing a medically appropriate examination, counseling and educating the patient,documenting clinical information in the electronic health record,  and care coordination.   Georga Kaufmann PA-C Dept of Hematology and Oncology [facility] Phone: [phone]   12/17/2022 10:49 AM

## 2022-12-17 NOTE — Patient Instructions (Signed)
Leuprolide Suspension for Injection (Prostate Cancer) What is this medication? LEUPROLIDE (loo PROE lide) reduces the symptoms of prostate cancer. It works by decreasing levels of the hormone testosterone in the body. This prevents prostate cancer cells from spreading or growing. This medicine may be used for other purposes; ask your health care provider or pharmacist if you have questions. COMMON BRAND NAME(S): Eligard, Lupron Depot, Lupron Depot-Ped, Lutrate Depot, Viadur What should I tell my care team before I take this medication? They need to know if you have any of these conditions: Diabetes Heart disease Heart failure High or low levels of electrolytes, such as magnesium, potassium, or sodium in your blood Irregular heartbeat or rhythm Seizures An unusual or allergic reaction to leuprolide, other medications, foods, dyes, or preservatives Pregnant or trying to get pregnant Breast-feeding How should I use this medication? This medication is injected under the skin or into a muscle. It is given by your care team in a hospital or clinic setting. Talk to your care team about the use of this medication in children. Special care may be needed. Overdosage: If you think you have taken too much of this medicine contact a poison control center or emergency room at once. NOTE: This medicine is only for you. Do not share this medicine with others. What if I miss a dose? Keep appointments for follow-up doses. It is important not to miss your dose. Call your care team if you are unable to keep an appointment. What may interact with this medication? Do not take this medication with any of the following: Cisapride Dronedarone Ketoconazole Levoketoconazole Pimozide Thioridazine This medication may also interact with the following: Other medications that cause heart rhythm changes This list may not describe all possible interactions. Give your health care provider a list of all the medicines,  herbs, non-prescription drugs, or dietary supplements you use. Also tell them if you smoke, drink alcohol, or use illegal drugs. Some items may interact with your medicine. What should I watch for while using this medication? Visit your care team for regular checks on your progress. Tell your care team if your symptoms do not start to get better or if they get worse. This medication may increase blood sugar. The risk may be higher in patients who already have diabetes. Ask your care team what you can do to lower the risk of diabetes while taking this medication. This medication may cause infertility. Talk to your care team if you are concerned about your fertility. Heart attacks and strokes have been reported with the use of this medication. Get emergency help if you develop signs or symptoms of a heart attack or stroke. Talk to your care team about the risks and benefits of this medication. What side effects may I notice from receiving this medication? Side effects that you should report to your care team as soon as possible: Allergic reactions--skin rash, itching, hives, swelling of the face, lips, tongue, or throat Heart attack--pain or tightness in the chest, shoulders, arms, or jaw, nausea, shortness of breath, cold or clammy skin, feeling faint or lightheaded Heart rhythm changes--fast or irregular heartbeat, dizziness, feeling faint or lightheaded, chest pain, trouble breathing High blood sugar (hyperglycemia)--increased thirst or amount of urine, unusual weakness or fatigue, blurry vision Mood swings, irritability, hostility Seizures Stroke--sudden numbness or weakness of the face, arm, or leg, trouble speaking, confusion, trouble walking, loss of balance or coordination, dizziness, severe headache, change in vision Thoughts of suicide or self-harm, worsening mood, feelings of depression Side   effects that usually do not require medical attention (report to your care team if they continue or  are bothersome): Bone pain Change in sex drive or performance General discomfort and fatigue Hot flashes Muscle pain Pain, redness, or irritation at injection site Swelling of the ankles, hands, or feet This list may not describe all possible side effects. Call your doctor for medical advice about side effects. You may report side effects to FDA at 1-800-FDA-1088. Where should I keep my medication? This medication is given in a hospital or clinic. It will not be stored at home. NOTE: This sheet is a summary. It may not cover all possible information. If you have questions about this medicine, talk to your doctor, pharmacist, or health care provider.  2023 Elsevier/Gold Standard (2021-09-24 00:00:00)  

## 2022-12-18 ENCOUNTER — Telehealth: Payer: Self-pay

## 2022-12-18 ENCOUNTER — Encounter: Payer: Self-pay | Admitting: Physician Assistant

## 2022-12-18 LAB — TESTOSTERONE: Testosterone: <3 ng/dL — ABNORMAL LOW (ref 264–916)

## 2022-12-18 LAB — PROSTATE-SPECIFIC AG, SERUM (LABCORP): Prostate Specific Ag, Serum: 0.2 ng/mL (ref 0.0–4.0)

## 2022-12-18 NOTE — Telephone Encounter (Signed)
LM for pt with lab results of normal PSA

## 2022-12-18 NOTE — Telephone Encounter (Signed)
-----   Message from Justin Cedar, PA-C sent at 12/18/2022  8:39 AM EDT ----- Please notify patient that PSA level is still normal at 0.2.

## 2022-12-20 ENCOUNTER — Other Ambulatory Visit (HOSPITAL_COMMUNITY): Payer: Self-pay

## 2022-12-22 ENCOUNTER — Other Ambulatory Visit: Payer: Self-pay | Admitting: Hematology and Oncology

## 2022-12-22 DIAGNOSIS — I480 Paroxysmal atrial fibrillation: Secondary | ICD-10-CM

## 2022-12-23 ENCOUNTER — Other Ambulatory Visit: Payer: Self-pay | Admitting: Hematology and Oncology

## 2022-12-23 DIAGNOSIS — I10 Essential (primary) hypertension: Secondary | ICD-10-CM

## 2022-12-25 ENCOUNTER — Other Ambulatory Visit (HOSPITAL_COMMUNITY): Payer: Self-pay

## 2023-01-04 ENCOUNTER — Other Ambulatory Visit: Payer: Self-pay | Admitting: Hematology and Oncology

## 2023-01-04 DIAGNOSIS — I10 Essential (primary) hypertension: Secondary | ICD-10-CM

## 2023-01-05 ENCOUNTER — Encounter: Payer: Self-pay | Admitting: Hematology and Oncology

## 2023-01-16 ENCOUNTER — Other Ambulatory Visit (HOSPITAL_COMMUNITY): Payer: Self-pay

## 2023-01-17 ENCOUNTER — Other Ambulatory Visit (HOSPITAL_COMMUNITY): Payer: Self-pay

## 2023-01-17 ENCOUNTER — Ambulatory Visit (INDEPENDENT_AMBULATORY_CARE_PROVIDER_SITE_OTHER): Payer: Medicare Other | Admitting: Nurse Practitioner

## 2023-01-17 ENCOUNTER — Encounter: Payer: Self-pay | Admitting: Nurse Practitioner

## 2023-01-17 ENCOUNTER — Telehealth: Payer: Self-pay | Admitting: Nurse Practitioner

## 2023-01-17 VITALS — BP 142/78 | HR 77 | Temp 97.0°F | Resp 20 | Ht 70.0 in | Wt 229.0 lb

## 2023-01-17 DIAGNOSIS — Z6832 Body mass index (BMI) 32.0-32.9, adult: Secondary | ICD-10-CM

## 2023-01-17 DIAGNOSIS — C61 Malignant neoplasm of prostate: Secondary | ICD-10-CM

## 2023-01-17 DIAGNOSIS — I480 Paroxysmal atrial fibrillation: Secondary | ICD-10-CM | POA: Diagnosis not present

## 2023-01-17 DIAGNOSIS — E782 Mixed hyperlipidemia: Secondary | ICD-10-CM | POA: Diagnosis not present

## 2023-01-17 DIAGNOSIS — Z0279 Encounter for issue of other medical certificate: Secondary | ICD-10-CM

## 2023-01-17 DIAGNOSIS — I10 Essential (primary) hypertension: Secondary | ICD-10-CM | POA: Diagnosis not present

## 2023-01-17 DIAGNOSIS — D45 Polycythemia vera: Secondary | ICD-10-CM

## 2023-01-17 DIAGNOSIS — F411 Generalized anxiety disorder: Secondary | ICD-10-CM

## 2023-01-17 DIAGNOSIS — Z0000 Encounter for general adult medical examination without abnormal findings: Secondary | ICD-10-CM

## 2023-01-17 DIAGNOSIS — Z0001 Encounter for general adult medical examination with abnormal findings: Secondary | ICD-10-CM

## 2023-01-17 LAB — CBC WITH DIFFERENTIAL/PLATELET
Basophils Absolute: 0 10*3/uL (ref 0.0–0.2)
Basos: 1 %
EOS (ABSOLUTE): 0.1 10*3/uL (ref 0.0–0.4)
Eos: 1 %
Hematocrit: 41.6 % (ref 37.5–51.0)
Hemoglobin: 14.5 g/dL (ref 13.0–17.7)
Immature Grans (Abs): 0 10*3/uL (ref 0.0–0.1)
Immature Granulocytes: 1 %
Lymphocytes Absolute: 0.7 10*3/uL (ref 0.7–3.1)
Lymphs: 8 %
MCH: 37.9 pg — ABNORMAL HIGH (ref 26.6–33.0)
MCHC: 34.9 g/dL (ref 31.5–35.7)
MCV: 109 fL — ABNORMAL HIGH (ref 79–97)
Monocytes Absolute: 0.4 10*3/uL (ref 0.1–0.9)
Monocytes: 5 %
Neutrophils Absolute: 7.1 10*3/uL — ABNORMAL HIGH (ref 1.4–7.0)
Neutrophils: 84 %
Platelets: 313 10*3/uL (ref 150–450)
RBC: 3.83 x10E6/uL — ABNORMAL LOW (ref 4.14–5.80)
RDW: 12.1 % (ref 11.6–15.4)
WBC: 8.4 10*3/uL (ref 3.4–10.8)

## 2023-01-17 LAB — LIPID PANEL
Chol/HDL Ratio: 3.6 ratio (ref 0.0–5.0)
Cholesterol, Total: 173 mg/dL (ref 100–199)
HDL: 48 mg/dL (ref >39)
LDL Chol Calc (NIH): 100 mg/dL — ABNORMAL HIGH (ref 0–99)
Triglycerides: 142 mg/dL (ref 0–149)
VLDL Cholesterol Cal: 25 mg/dL (ref 5–40)

## 2023-01-17 LAB — CMP14+EGFR
ALT: 12 IU/L (ref 0–44)
AST: 17 IU/L (ref 0–40)
Albumin: 4.2 g/dL (ref 3.8–4.8)
Alkaline Phosphatase: 81 IU/L (ref 44–121)
BUN/Creatinine Ratio: 24 (ref 10–24)
BUN: 29 mg/dL — ABNORMAL HIGH (ref 8–27)
Bilirubin Total: 0.9 mg/dL (ref 0.0–1.2)
CO2: 27 mmol/L (ref 20–29)
Calcium: 9.2 mg/dL (ref 8.6–10.2)
Chloride: 100 mmol/L (ref 96–106)
Creatinine, Ser: 1.22 mg/dL (ref 0.76–1.27)
Globulin, Total: 2.6 g/dL (ref 1.5–4.5)
Glucose: 118 mg/dL — ABNORMAL HIGH (ref 70–99)
Potassium: 4.3 mmol/L (ref 3.5–5.2)
Sodium: 142 mmol/L (ref 134–144)
Total Protein: 6.8 g/dL (ref 6.0–8.5)
eGFR: 60 mL/min/{1.73_m2} (ref >59)

## 2023-01-17 MED ORDER — LISINOPRIL-HYDROCHLOROTHIAZIDE 20-12.5 MG PO TABS: 2.0000 | ORAL_TABLET | Freq: Every day | ORAL | 1 refills | Status: DC

## 2023-01-17 MED ORDER — METOPROLOL TARTRATE 25 MG PO TABS: 25.0000 mg | ORAL_TABLET | Freq: Two times a day (BID) | ORAL | 1 refills | Status: DC

## 2023-01-17 MED ORDER — APIXABAN 5 MG PO TABS: ORAL_TABLET | ORAL | 1 refills | Status: DC

## 2023-01-17 MED ORDER — HYDROXYUREA 500 MG PO CAPS: ORAL_CAPSULE | ORAL | 1 refills | Status: DC

## 2023-01-17 MED ORDER — PAROXETINE HCL 10 MG PO TABS: ORAL_TABLET | ORAL | 1 refills | Status: DC

## 2023-01-17 NOTE — Progress Notes (Signed)
Subjective:    Patient ID: Justin Gibbs, male    DOB: [birth year], 82 y.o.   MRN: [id number]   Chief Complaint: Annual Exam    HPI:  Justin Gibbs is a 82 y.o. who identifies as a male who was assigned male at birth.   Social history: Lives with: wife  Work history: retired   Water engineer in today for follow up of the following chronic medical issues:  1. Annual physical exam  2. Essential hypertension No c/o chest pain, sob or headache. Does not check blood pressure at home. BP Readings from Last 3 Encounters:  01/17/23 (!) 166/83  12/17/22 (!) 158/91  09/17/22 (!) 171/97    3. Paroxysmal atrial fibrillation (HCC) Denies palpitations or heart racing. Is on eliquis with no bleeding issues  4. Mixed hyperlipidemia Does try to watch diet but does not do much exercise. Lab Results  Component Value Date   CHOL 166 05/22/2021   HDL 41 05/22/2021   LDLCALC 95 05/22/2021   TRIG 175 (H) 05/22/2021   CHOLHDL 4.0 05/22/2021     5. Polycythemia vera (HCC) Not bothering him Lab Results  Component Value Date   HGB 14.2 12/17/2022     6. Prostate cancer [facility]) He says it is in check right now. Last ct scan was clear. Has oncology follow up August 21,2024  7. BMI 32.0-32.9,adult No recent weight changes Wt Readings from Last 3 Encounters:  01/17/23 229 lb (103.9 kg)  12/17/22 229 lb 1.6 oz (103.9 kg)  09/17/22 229 lb 12.8 oz (104.2 kg)   BMI Readings from Last 3 Encounters:  01/17/23 32.86 kg/m  12/17/22 32.87 kg/m  09/17/22 32.97 kg/m     New complaints: None today  No Known Allergies Outpatient Encounter Medications as of 01/17/2023  Medication Sig   abiraterone acetate (ZYTIGA) 250 MG tablet Take 4 tablets (1,000 mg total) by mouth daily. Take on an empty stomach 1 hour before or 2 hours after a meal   ELIQUIS 5 MG TABS tablet TAKE 1 TABLET BY MOUTH TWICE DAILY . APPOINTMENT REQUIRED FOR FUTURE REFILLS   hydroxyurea (HYDREA) 500 MG capsule TAKE 1  CAPSULE BY MOUTH TWICE DAILY WITH FOOD TO MINIMIZE GI SIDE EFFECTS   lisinopril-hydrochlorothiazide (ZESTORETIC) 20-12.5 MG tablet Take 2 tablets by mouth once daily   metoprolol tartrate (LOPRESSOR) 25 MG tablet Take 1 tablet (25 mg total) by mouth 2 (two) times daily.   Multiple Vitamin (MULTIVITAMIN) capsule Take 1 capsule by mouth daily.   PARoxetine (PAXIL) 10 MG tablet Take 1/2 (one-half) tablet by mouth twice daily   predniSONE (DELTASONE) 5 MG tablet Take 1 tablet (5 mg total) by mouth daily with breakfast.   [DISCONTINUED] predniSONE (DELTASONE) 5 MG tablet Take 1 tablet (5 mg total) by mouth daily with breakfast.   No facility-administered encounter medications on file as of 01/17/2023.    Past Surgical History:  Procedure Laterality Date   BIOPSY  08/27/2021   Procedure: BIOPSY;  Surgeon: Lanelle Bal, DO;  [facility]: [facility];  Service: Endoscopy;;   COLONOSCOPY WITH PROPOFOL N/A 08/27/2021   Procedure: COLONOSCOPY WITH PROPOFOL;  Surgeon: Lanelle Bal, DO;  Location: [facility];  Service: Endoscopy;  Laterality: N/A;  8:00am   HERNIA REPAIR Bilateral    x 2    POLYPECTOMY  08/27/2021   Procedure: POLYPECTOMY;  Surgeon: Lanelle Bal, DO;  Location: [facility];  Service: Endoscopy;;   PROSTATECTOMY      Family  History  Problem Relation Age of Onset   Alzheimer's disease Mother    Alcohol abuse Father    Cancer Father        leukemia   Cirrhosis Father    Stomach cancer Father    Stroke Brother    Diabetes Daughter    Cancer Maternal Grandmother    Cancer Paternal Grandfather    Prostate cancer Brother    Brain cancer Brother    Thyroid cancer Sister       Controlled substance contract: n/a     Review of Systems  Constitutional:  Negative for diaphoresis.  Eyes:  Negative for pain.  Respiratory:  Negative for shortness of breath.   Cardiovascular:  Negative for chest pain, palpitations and leg swelling.  Gastrointestinal:  Negative  for abdominal pain.  Endocrine: Negative for polydipsia.  Skin:  Negative for rash.  Neurological:  Negative for dizziness, weakness and headaches.  Hematological:  Does not bruise/bleed easily.  All other systems reviewed and are negative.      Objective:   Physical Exam Vitals and nursing note reviewed.  Constitutional:      Appearance: Normal appearance. He is well-developed.  HENT:     Head: Normocephalic.     Nose: Nose normal.     Mouth/Throat:     Mouth: Mucous membranes are moist.     Pharynx: Oropharynx is clear.  Eyes:     Pupils: Pupils are equal, round, and reactive to light.  Neck:     Thyroid: No thyroid mass or thyromegaly.     Vascular: No carotid bruit or JVD.     Trachea: Phonation normal.  Cardiovascular:     Rate and Rhythm: Normal rate. Rhythm irregular.  Pulmonary:     Effort: Pulmonary effort is normal. No respiratory distress.     Breath sounds: Normal breath sounds.  Abdominal:     General: Bowel sounds are normal.     Palpations: Abdomen is soft.     Tenderness: There is no abdominal tenderness.  Musculoskeletal:        General: Normal range of motion.     Cervical back: Normal range of motion and neck supple.     Comments: Ambulating with a cane  Lymphadenopathy:     Cervical: No cervical adenopathy.  Skin:    General: Skin is warm and dry.  Neurological:     Mental Status: He is alert and oriented to person, place, and time.  Psychiatric:        Behavior: Behavior normal.        Thought Content: Thought content normal.        Judgment: Judgment normal.     BP (!) 142/78   Pulse 77   Temp (!) 97 F (36.1 C) (Temporal)   Resp 20   Ht 5\' 10"  (1.778 m)   Wt 229 lb (103.9 kg)   SpO2 97%   BMI 32.86 kg/m         Assessment & Plan:  Justin Gibbs comes in today with chief complaint of Annual Exam   Diagnosis and orders addressed:  1. Annual physical exam  2. Essential hypertension Low sodium diet Keep diary of blood  pressure at home - hydroxyurea (HYDREA) 500 MG capsule; TAKE 1 CAPSULE BY MOUTH TWICE DAILY WITH FOOD TO MINIMIZE GI SIDE EFFECTS  Dispense: 180 capsule; Refill: 1 - lisinopril-hydrochlorothiazide (ZESTORETIC) 20-12.5 MG tablet; Take 2 tablets by mouth daily.  Dispense: 180 tablet; Refill: 1 - CBC with  Differential/Platelet - CMP14+EGFR - Lipid panel  3. Paroxysmal atrial fibrillation (HCC) Avoid caffeine Report heart racing - apixaban (ELIQUIS) 5 MG TABS tablet; TAKE 1 TABLET BY MOUTH TWICE DAILY . APPOINTMENT REQUIRED FOR FUTURE REFILLS  Dispense: 180 tablet; Refill: 1 - metoprolol tartrate (LOPRESSOR) 25 MG tablet; Take 1 tablet (25 mg total) by mouth 2 (two) times daily.  Dispense: 180 tablet; Refill: 1  4. Mixed hyperlipidemia Low fat diet  5. Polycythemia vera (HCC) Labs pending  6. Prostate cancer [facility]) Keep follow up with oncology  7. BMI 32.0-32.9,adult Discussed diet and exercise for person with BMI >25 Will recheck weight in 3-6 months   8. GAD (generalized anxiety disorder) Stress management - PARoxetine (PAXIL) 10 MG tablet; Take 1/2 (one-half) tablet by mouth twice daily  Dispense: 90 tablet; Refill: 1   Labs pending Health Maintenance reviewed Diet and exercise encouraged  Follow up plan: 6   Mary-Margaret Daphine Deutscher, FNP

## 2023-01-17 NOTE — Patient Instructions (Signed)
Fall Prevention in the Home, Adult Falls can cause injuries and can happen to people of all ages. There are many things you can do to make your home safer and to help prevent falls. What actions can I take to prevent falls? General information Use good lighting in all rooms. Make sure to: Replace any light bulbs that burn out. Turn on the lights in dark areas and use night-lights. Keep items that you use often in easy-to-reach places. Lower the shelves around your home if needed. Move furniture so that there are clear paths around it. Do not use throw rugs or other things on the floor that can make you trip. If any of your floors are uneven, fix them. Add color or contrast paint or tape to clearly mark and help you see: Grab bars or handrails. First and last steps of staircases. Where the edge of each step is. If you use a ladder or stepladder: Make sure that it is fully opened. Do not climb a closed ladder. Make sure the sides of the ladder are locked in place. Have someone hold the ladder while you use it. Know where your pets are as you move through your home. What can I do in the bathroom?     Keep the floor dry. Clean up any water on the floor right away. Remove soap buildup in the bathtub or shower. Buildup makes bathtubs and showers slippery. Use non-skid mats or decals on the floor of the bathtub or shower. Attach bath mats securely with double-sided, non-slip rug tape. If you need to sit down in the shower, use a non-slip stool. Install grab bars by the toilet and in the bathtub and shower. Do not use towel bars as grab bars. What can I do in the bedroom? Make sure that you have a light by your bed that is easy to reach. Do not use any sheets or blankets on your bed that hang to the floor. Have a firm chair or bench with side arms that you can use for support when you get dressed. What can I do in the kitchen? Clean up any spills right away. If you need to reach something  above you, use a step stool with a grab bar. Keep electrical cords out of the way. Do not use floor polish or wax that makes floors slippery. What can I do with my stairs? Do not leave anything on the stairs. Make sure that you have a light switch at the top and the bottom of the stairs. Make sure that there are handrails on both sides of the stairs. Fix handrails that are broken or loose. Install non-slip stair treads on all your stairs if they do not have carpet. Avoid having throw rugs at the top or bottom of the stairs. Choose a carpet that does not hide the edge of the steps on the stairs. Make sure that the carpet is firmly attached to the stairs. Fix carpet that is loose or worn. What can I do on the outside of my home? Use bright outdoor lighting. Fix the edges of walkways and driveways and fix any cracks. Clear paths of anything that can make you trip, such as tools or rocks. Add color or contrast paint or tape to clearly mark and help you see anything that might make you trip as you walk through a door, such as a raised step or threshold. Trim any bushes or trees on paths to your home. Check to see if handrails are loose   or broken and that both sides of all steps have handrails. Install guardrails along the edges of any raised decks and porches. Have leaves, snow, or ice cleared regularly. Use sand, salt, or ice melter on paths if you live where there is ice and snow during the winter. Clean up any spills in your garage right away. This includes grease or oil spills. What other actions can I take? Review your medicines with your doctor. Some medicines can cause dizziness or changes in blood pressure, which increase your risk of falling. Wear shoes that: Have a low heel. Do not wear high heels. Have rubber bottoms and are closed at the toe. Feel good on your feet and fit well. Use tools that help you move around if needed. These include: Canes. Walkers. Scooters. Crutches. Ask  your doctor what else you can do to help prevent falls. This may include seeing a physical therapist to learn to do exercises to move better and get stronger. Where to find more information Centers for Disease Control and Prevention, [employer]: cdc.gov National Institute on Aging: nia.nih.gov National Institute on Aging: nia.nih.gov Contact a doctor if: You are afraid of falling at home. You feel weak, drowsy, or dizzy at home. You fall at home. Get help right away if you: Lose consciousness or have trouble moving after a fall. Have a fall that causes a head injury. These symptoms may be an emergency. Get help right away. Call 911. Do not wait to see if the symptoms will go away. Do not drive yourself to the hospital. This information is not intended to replace advice given to you by your health care provider. Make sure you discuss any questions you have with your health care provider. Document Revised: 03/18/2022 Document Reviewed: 03/18/2022 Elsevier Patient Education  2024 [employer]  

## 2023-01-17 NOTE — Telephone Encounter (Signed)
Pt had handicap form filled out at dr visit  Form Fee Paid? (Y/N)       yes     If NO, form is placed on front office manager desk to hold until payment received. If YES, then form will be placed in the RX/HH Nurse Coordinators box for completion.  Form will not be processed until payment is received

## 2023-01-21 ENCOUNTER — Other Ambulatory Visit (HOSPITAL_COMMUNITY): Payer: Self-pay

## 2023-02-05 ENCOUNTER — Ambulatory Visit: Payer: Medicare Other

## 2023-02-05 VITALS — Ht 70.0 in | Wt 220.0 lb

## 2023-02-05 DIAGNOSIS — Z0000 Encounter for general adult medical examination without abnormal findings: Secondary | ICD-10-CM | POA: Diagnosis not present

## 2023-02-05 NOTE — Progress Notes (Signed)
Subjective:   Justin Gibbs is a 82 y.o. male who presents for Medicare Annual/Subsequent preventive examination.  Visit Complete: Virtual  I connected with  Justin Gibbs on 02/05/23 by a audio enabled telemedicine application and verified that I am speaking with the correct person using two identifiers.  Patient Location: Home  Provider Location: Home Office  I discussed the limitations of evaluation and management by telemedicine. The patient expressed understanding and agreed to proceed.  Patient Medicare AWV questionnaire was completed by the patient on  02/05/2023; I have confirmed that all information answered by patient is correct and no changes since this date.  Review of Systems     Cardiac Risk Factors include: advanced age (>3men, >39 women);male gender;hypertension;dyslipidemia     Objective:    Today's Vitals   02/05/23 1350  Weight: 220 lb (99.8 kg)  Height: 5\' 10"  (1.778 m)   Body mass index is 31.57 kg/m.     02/05/2023    1:53 PM 02/01/2022    3:08 PM 08/27/2021    6:55 AM 08/23/2021   10:29 AM 08/21/2021   10:09 AM 03/19/2021   10:37 AM 01/31/2021    2:59 PM  Advanced Directives  Does Patient Have a Medical Advance Directive? Yes No No No No Yes Yes  Type of Estate agent of [place];Living will     Living will Healthcare Power of [place];Living will  Copy of Healthcare Power of Attorney in Chart? No - copy requested      No - copy requested  Would patient like information on creating a medical advance directive?  No - Patient declined No - Patient declined No - Patient declined       Current Medications (verified) Outpatient Encounter Medications as of 02/05/2023  Medication Sig   abiraterone acetate (ZYTIGA) 250 MG tablet Take 4 tablets (1,000 mg total) by mouth daily. Take on an empty stomach 1 hour before or 2 hours after a meal   apixaban (ELIQUIS) 5 MG TABS tablet TAKE 1 TABLET BY MOUTH TWICE DAILY . APPOINTMENT REQUIRED  FOR FUTURE REFILLS   hydroxyurea (HYDREA) 500 MG capsule TAKE 1 CAPSULE BY MOUTH TWICE DAILY WITH FOOD TO MINIMIZE GI SIDE EFFECTS   lisinopril-hydrochlorothiazide (ZESTORETIC) 20-12.5 MG tablet Take 2 tablets by mouth daily.   metoprolol tartrate (LOPRESSOR) 25 MG tablet Take 1 tablet (25 mg total) by mouth 2 (two) times daily.   Multiple Vitamin (MULTIVITAMIN) capsule Take 1 capsule by mouth daily.   PARoxetine (PAXIL) 10 MG tablet Take 1/2 (one-half) tablet by mouth twice daily   predniSONE (DELTASONE) 5 MG tablet Take 1 tablet (5 mg total) by mouth daily with breakfast.   No facility-administered encounter medications on file as of 02/05/2023.    Allergies (verified) Patient has no known allergies.   History: Past Medical History:  Diagnosis Date   Anxiety    Arthritis    Atrial fibrillation (HCC) 06/2020   Cancer (HCC)    prostate / removed 2006   Depression    Dysrhythmia    High triglycerides    Hypertension    Polycythemia vera, acquired [facility])    Past Surgical History:  Procedure Laterality Date   BIOPSY  08/27/2021   Procedure: BIOPSY;  Surgeon: Lanelle Bal, DO;  [facility];  Service: Endoscopy;;   COLONOSCOPY WITH PROPOFOL N/A 08/27/2021   Procedure: COLONOSCOPY WITH PROPOFOL;  Surgeon: Lanelle Bal, DO;  [facility];  Service: Endoscopy;  Laterality:  N/A;  8:00am   HERNIA REPAIR Bilateral    x 2    POLYPECTOMY  08/27/2021   Procedure: POLYPECTOMY;  Surgeon: Lanelle Bal, DO;  [facility];  Service: Endoscopy;;   PROSTATECTOMY     Family History  Problem Relation Age of Onset   Alzheimer's disease Mother    Alcohol abuse Father    Cancer Father        leukemia   Cirrhosis Father    Stomach cancer Father    Stroke Brother    Diabetes Daughter    Cancer Maternal Grandmother    Cancer Paternal Grandfather    Prostate cancer Brother    Brain cancer Brother    Thyroid cancer Sister    Social History    Socioeconomic History   Marital status: Married    Spouse name: Dawn   Number of children: 3   Years of education: Not on file   Highest education level: Not on file  Occupational History   Occupation: retired     Comment: 2007  Tobacco Use   Smoking status: Former    Packs/day: 1.00    Years: 23.00    Additional pack years: 0.00    Total pack years: 23.00    Types: Cigarettes, Cigars    Quit date: 06/12/1982    Years since quitting: 40.6   Smokeless tobacco: Never   Tobacco comments:    I was a light smoker  Vaping Use   Vaping Use: Never used  Substance and Sexual Activity   Alcohol use: Never   Drug use: Never   Sexual activity: Not Currently    Comment: Prostate removal  Nov. 15 2006  Other Topics Concern   Not on file  Social History Narrative   Lives with wife and son. He has a basement with a wood stove.   Social Determinants of Health   Financial Resource Strain: Low Risk  (02/05/2023)   Overall Financial Resource Strain (CARDIA)    Difficulty of Paying Living Expenses: Not hard at all  Food Insecurity: No Food Insecurity (02/05/2023)   Hunger Vital Sign    Worried About Running Out of Food in the Last Year: Never true    Ran Out of Food in the Last Year: Never true  Transportation Needs: No Transportation Needs (02/05/2023)   PRAPARE - Administrator, Civil Service (Medical): No    Lack of Transportation (Non-Medical): No  Physical Activity: Inactive (02/05/2023)   Exercise Vital Sign    Days of Exercise per Week: 0 days    Minutes of Exercise per Session: 0 min  Stress: No Stress Concern Present (02/05/2023)   [employer] of Occupational Health - Occupational Stress Questionnaire    Feeling of Stress : Not at all  Social Connections: Patient Unable To Answer (02/05/2023)   Social Connection and Isolation Panel [NHANES]    Frequency of Communication with Friends and Family: Patient unable to answer    Frequency of Social Gatherings with  Friends and Family: Patient unable to answer    Attends Religious Services: Patient unable to answer    Active Member of Clubs or Organizations: Patient unable to answer    Attends Banker Meetings: Patient unable to answer    Marital Status: Patient unable to answer    Tobacco Counseling Counseling given: Not Answered Tobacco comments: I was a light smoker   Clinical Intake:  Pre-visit preparation completed: Yes  Pain : No/denies pain  Nutritional Risks: None Diabetes: No  How often do you need to have someone help you when you read instructions, pamphlets, or other written materials from your doctor or pharmacy?: 1 - Never  Interpreter Needed?: No  Information entered by :: Renie Ora, LPN   Activities of Daily Living    02/05/2023    1:54 PM 02/01/2023    9:25 AM  In your present state of health, do you have any difficulty performing the following activities:  Hearing? 0 1  Vision? 0 0  Difficulty concentrating or making decisions? 0 0  Walking or climbing stairs? 0 1  Dressing or bathing? 0 0  Doing errands, shopping? 0 0  Preparing Food and eating ? N N  Using the Toilet? N N  In the past six months, have you accidently leaked urine? N N  Do you have problems with loss of bowel control? N N  Managing your Medications? N N  Managing your Finances? N N  Housekeeping or managing your Housekeeping? N N    Patient Care Team: Bennie Pierini, FNP as PCP - General (Family Medicine) Rollene Rotunda, MD as PCP - Cardiology (Cardiology) Margaretmary Dys, MD as Consulting Physician (Radiation Oncology) Jaci Standard, MD as Consulting Physician (Hematology and Oncology) Axel Filler Larna Daughters, NP as Nurse Practitioner (Hematology and Oncology) Maryclare Labrador, RN as Registered Nurse  Indicate any recent Medical Services you may have received from other than [facility] providers in the past year (date may be approximate).     Assessment:    This is a routine wellness examination for [place].  Hearing/Vision screen Vision Screening - Comments:: Wears rx glasses - up to date with routine eye exams with  Dr.Lee   Dietary issues and exercise activities discussed:     Goals Addressed             This Visit's Progress    DIET - INCREASE WATER INTAKE         Depression Screen    02/05/2023    1:52 PM 01/17/2023    9:40 AM 02/01/2022    2:56 PM 01/08/2022   10:50 AM 06/12/2021   11:18 AM 05/22/2021   10:48 AM 02/12/2021   10:32 AM  PHQ 2/9 Scores  PHQ - 2 Score 0 0 0 0 0 0 0  PHQ- 9 Score 0 4 1 2  0 0 0    Fall Risk    02/05/2023    1:51 PM 02/05/2023    1:50 PM 02/01/2023    9:25 AM 01/17/2023    9:40 AM 02/01/2022    2:55 PM  Fall Risk   Falls in the past year? 0 0  1 0  Number falls in past yr: 0 0 1 1 0  Injury with Fall? 0 0 0 0 0  Risk for fall due to : No Fall Risks No Fall Risks  History of fall(s) No Fall Risks  Follow up Falls prevention discussed Falls prevention discussed  Education provided Falls prevention discussed    MEDICARE RISK AT HOME:  Medicare Risk at Home - 02/05/23 1351     Any stairs in or around the home? Yes    If so, are there any without handrails? No    Home free of loose throw rugs in walkways, pet beds, electrical cords, etc? Yes    Adequate lighting in your home to reduce risk of falls? Yes    Life alert? No    Use of a cane,  walker or w/c? No    Grab bars in the bathroom? No    Shower chair or bench in shower? No    Elevated toilet seat or a handicapped toilet? No             TIMED UP AND GO:  Was the test performed?  No    Cognitive Function:        02/05/2023    1:54 PM 02/01/2022    3:08 PM 01/31/2021    3:00 PM  6CIT Screen  What Year? 0 points 0 points 0 points  What month? 0 points 0 points 0 points  What time? 0 points 0 points 0 points  Count back from 20 0 points 0 points 0 points  Months in reverse 0 points 0 points 2 points  Repeat phrase 0 points 2  points 2 points  Total Score 0 points 2 points 4 points    Immunizations Immunization History  Administered Date(s) Administered   Fluad Quad(high Dose 65+) 05/02/2021   Influenza,inj,Quad PF,6+ Mos 05/18/2019   Influenza-Unspecified 06/08/2020   PFIZER(Purple Top)SARS-COV-2 Vaccination 11/11/2019, 12/03/2019   Pneumococcal Conjugate-13 07/05/2019   Pneumococcal Polysaccharide-23 08/10/2020    TDAP status: Due, Education has been provided regarding the importance of this vaccine. Advised may receive this vaccine at local pharmacy or Health Dept. Aware to provide a copy of the vaccination record if obtained from local pharmacy or Health Dept. Verbalized acceptance and understanding.  Flu Vaccine status: Up to date  Pneumococcal vaccine status: Up to date  Covid-19 vaccine status: Completed vaccines  Qualifies for Shingles Vaccine? Yes   Zostavax completed No   Shingrix Completed?: No.    Education has been provided regarding the importance of this vaccine. Patient has been advised to call insurance company to determine out of pocket expense if they have not yet received this vaccine. Advised may also receive vaccine at local pharmacy or Health Dept. Verbalized acceptance and understanding.  Screening Tests Health Maintenance  Topic Date Due   DTaP/Tdap/Td (1 - Tdap) Never done   COVID-19 Vaccine (3 - Pfizer risk series) 12/31/2019   Zoster Vaccines- Shingrix (1 of 2) 04/19/2023 (Originally 02/03/1960)   INFLUENZA VACCINE  02/27/2023   Medicare Annual Wellness (AWV)  02/05/2024   Colonoscopy  08/28/2031   Pneumonia Vaccine 73+ Years old  Completed   HPV VACCINES  Aged Out    Health Maintenance  Health Maintenance Due  Topic Date Due   DTaP/Tdap/Td (1 - Tdap) Never done   COVID-19 Vaccine (3 - Pfizer risk series) 12/31/2019    Colorectal cancer screening: No longer required.   Lung Cancer Screening: (Low Dose CT Chest recommended if Age 78-80 years, 20 pack-year currently  smoking OR have quit w/in 15years.) does not qualify.   Lung Cancer Screening Referral: n/a  Additional Screening:  Hepatitis C Screening: does not qualify;  Vision Screening: Recommended annual ophthalmology exams for early detection of glaucoma and other disorders of the eye. Is the patient up to date with their annual eye exam?  Yes  Who is the provider or what is the name of the office in which the patient attends annual eye exams? Dr.Lee  If pt is not established with a provider, would they like to be referred to a provider to establish care? No .   Dental Screening: Recommended annual dental exams for proper oral hygiene    Community Resource Referral / Chronic Care Management: CRR required this visit?  No   CCM required  this visit?  No     Plan:     I have personally reviewed and noted the following in the patient's chart:   Medical and social history Use of alcohol, tobacco or illicit drugs  Current medications and supplements including opioid prescriptions. Patient is not currently taking opioid prescriptions. Functional ability and status Nutritional status Physical activity Advanced directives List of other physicians Hospitalizations, surgeries, and ER visits in previous 12 months Vitals Screenings to include cognitive, depression, and falls Referrals and appointments  In addition, I have reviewed and discussed with patient certain preventive protocols, quality metrics, and best practice recommendations. A written personalized care plan for preventive services as well as general preventive health recommendations were provided to patient.     Lorrene Reid, LPN   [id number]   After Visit Summary: (MyChart) Due to this being a telephonic visit, the after visit summary with patients personalized plan was offered to patient via MyChart   Nurse Notes: none

## 2023-02-05 NOTE — Patient Instructions (Signed)
Mr. Slimp , Thank you for taking time to come for your Medicare Wellness Visit. I appreciate your ongoing commitment to your health goals. Please review the following plan we discussed and let me know if I can assist you in the future.   These are the goals we discussed:  Goals      DIET - INCREASE WATER INTAKE     Exercise 3x per week (30 min per time)     02/01/2022 - Hopes to stay active and independent         This is a list of the screening recommended for you and due dates:  Health Maintenance  Topic Date Due   DTaP/Tdap/Td vaccine (1 - Tdap) Never done   COVID-19 Vaccine (3 - Pfizer risk series) 12/31/2019   Zoster (Shingles) Vaccine (1 of 2) 04/19/2023*   Flu Shot  02/27/2023   Medicare Annual Wellness Visit  02/05/2024   Colon Cancer Screening  08/28/2031   Pneumonia Vaccine  Completed   HPV Vaccine  Aged Out  *Topic was postponed. The date shown is not the original due date.    Advanced directives: Advance directive discussed with you today. I have provided a copy for you to complete at home and have notarized. Once this is complete please bring a copy in to our office so we can scan it into your chart. Information on Advanced Care Planning can be found at [facility] [place] Advance Health Care Directives Advance Health Care Directives ([email or web address]    Conditions/risks identified: Aim for 30 minutes of exercise or brisk walking, 6-8 glasses of water, and 5 servings of fruits and vegetables each day.   Next appointment: Follow up in one year for your annual wellness visit.   Preventive Care 51 Years and Older, Male  Preventive care refers to lifestyle choices and visits with your health care provider that can promote health and wellness. What does preventive care include? A yearly physical exam. This is also called an annual well check. Dental exams once or twice a year. Routine eye exams. Ask your health care provider how often you should have your eyes  checked. Personal lifestyle choices, including: Daily care of your teeth and gums. Regular physical activity. Eating a healthy diet. Avoiding tobacco and drug use. Limiting alcohol use. Practicing safe sex. Taking low doses of aspirin every day. Taking vitamin and mineral supplements as recommended by your health care provider. What happens during an annual well check? The services and screenings done by your health care provider during your annual well check will depend on your age, overall health, lifestyle risk factors, and family history of disease. Counseling  Your health care provider may ask you questions about your: Alcohol use. Tobacco use. Drug use. Emotional well-being. Home and relationship well-being. Sexual activity. Eating habits. History of falls. Memory and ability to understand (cognition). Work and work Astronomer. Screening  You may have the following tests or measurements: Height, weight, and BMI. Blood pressure. Lipid and cholesterol levels. These may be checked every 5 years, or more frequently if you are over 3 years old. Skin check. Lung cancer screening. You may have this screening every year starting at age 82 if you have a 30-pack-year history of smoking and currently smoke or have quit within the past 15 years. Fecal occult blood test (FOBT) of the stool. You may have this test every year starting at age 34. Flexible sigmoidoscopy or colonoscopy. You may have a sigmoidoscopy every 5 years or a  colonoscopy every 10 years starting at age 51. Prostate cancer screening. Recommendations will vary depending on your family history and other risks. Hepatitis C blood test. Hepatitis B blood test. Sexually transmitted disease (STD) testing. Diabetes screening. This is done by checking your blood sugar (glucose) after you have not eaten for a while (fasting). You may have this done every 1-3 years. Abdominal aortic aneurysm (AAA) screening. You may need this  if you are a current or former smoker. Osteoporosis. You may be screened starting at age 68 if you are at high risk. Talk with your health care provider about your test results, treatment options, and if necessary, the need for more tests. Vaccines  Your health care provider may recommend certain vaccines, such as: Influenza vaccine. This is recommended every year. Tetanus, diphtheria, and acellular pertussis (Tdap, Td) vaccine. You may need a Td booster every 10 years. Zoster vaccine. You may need this after age 85. Pneumococcal 13-valent conjugate (PCV13) vaccine. One dose is recommended after age 41. Pneumococcal polysaccharide (PPSV23) vaccine. One dose is recommended after age 71. Talk to your health care provider about which screenings and vaccines you need and how often you need them. This information is not intended to replace advice given to you by your health care provider. Make sure you discuss any questions you have with your health care provider. Document Released: 08/11/2015 Document Revised: 04/03/2016 Document Reviewed: 05/16/2015 Elsevier Interactive Patient Education  2017 [employer].  Fall Prevention in the Home Falls can cause injuries. They can happen to people of all ages. There are many things you can do to make your home safe and to help prevent falls. What can I do on the outside of my home? Regularly fix the edges of walkways and driveways and fix any cracks. Remove anything that might make you trip as you walk through a door, such as a raised step or threshold. Trim any bushes or trees on the path to your home. Use bright outdoor lighting. Clear any walking paths of anything that might make someone trip, such as rocks or tools. Regularly check to see if handrails are loose or broken. Make sure that both sides of any steps have handrails. Any raised decks and porches should have guardrails on the edges. Have any leaves, snow, or ice cleared regularly. Use sand  or salt on walking paths during winter. Clean up any spills in your garage right away. This includes oil or grease spills. What can I do in the bathroom? Use night lights. Install grab bars by the toilet and in the tub and shower. Do not use towel bars as grab bars. Use non-skid mats or decals in the tub or shower. If you need to sit down in the shower, use a plastic, non-slip stool. Keep the floor dry. Clean up any water that spills on the floor as soon as it happens. Remove soap buildup in the tub or shower regularly. Attach bath mats securely with double-sided non-slip rug tape. Do not have throw rugs and other things on the floor that can make you trip. What can I do in the bedroom? Use night lights. Make sure that you have a light by your bed that is easy to reach. Do not use any sheets or blankets that are too big for your bed. They should not hang down onto the floor. Have a firm chair that has side arms. You can use this for support while you get dressed. Do not have throw rugs and other things  on the floor that can make you trip. What can I do in the kitchen? Clean up any spills right away. Avoid walking on wet floors. Keep items that you use a lot in easy-to-reach places. If you need to reach something above you, use a strong step stool that has a grab bar. Keep electrical cords out of the way. Do not use floor polish or wax that makes floors slippery. If you must use wax, use non-skid floor wax. Do not have throw rugs and other things on the floor that can make you trip. What can I do with my stairs? Do not leave any items on the stairs. Make sure that there are handrails on both sides of the stairs and use them. Fix handrails that are broken or loose. Make sure that handrails are as long as the stairways. Check any carpeting to make sure that it is firmly attached to the stairs. Fix any carpet that is loose or worn. Avoid having throw rugs at the top or bottom of the stairs.  If you do have throw rugs, attach them to the floor with carpet tape. Make sure that you have a light switch at the top of the stairs and the bottom of the stairs. If you do not have them, ask someone to add them for you. What else can I do to help prevent falls? Wear shoes that: Do not have high heels. Have rubber bottoms. Are comfortable and fit you well. Are closed at the toe. Do not wear sandals. If you use a stepladder: Make sure that it is fully opened. Do not climb a closed stepladder. Make sure that both sides of the stepladder are locked into place. Ask someone to hold it for you, if possible. Clearly mark and make sure that you can see: Any grab bars or handrails. First and last steps. Where the edge of each step is. Use tools that help you move around (mobility aids) if they are needed. These include: Canes. Walkers. Scooters. Crutches. Turn on the lights when you go into a dark area. Replace any light bulbs as soon as they burn out. Set up your furniture so you have a clear path. Avoid moving your furniture around. If any of your floors are uneven, fix them. If there are any pets around you, be aware of where they are. Review your medicines with your doctor. Some medicines can make you feel dizzy. This can increase your chance of falling. Ask your doctor what other things that you can do to help prevent falls. This information is not intended to replace advice given to you by your health care provider. Make sure you discuss any questions you have with your health care provider. Document Released: 05/11/2009 Document Revised: 12/21/2015 Document Reviewed: 08/19/2014 Elsevier Interactive Patient Education  2017 [employer].

## 2023-02-13 ENCOUNTER — Other Ambulatory Visit (HOSPITAL_COMMUNITY): Payer: Self-pay

## 2023-02-17 ENCOUNTER — Other Ambulatory Visit (HOSPITAL_COMMUNITY): Payer: Self-pay

## 2023-02-19 ENCOUNTER — Telehealth: Payer: Self-pay | Admitting: Pharmacy Technician

## 2023-02-19 NOTE — Telephone Encounter (Signed)
Oral Oncology Patient Advocate Encounter  Returned call from patient regarding needing a claim submission to keep their [facility] grant active.  Account is in good standing and claim from 01/21/23 has  now been processed correctly.   No action is needed at this time.  Jinger Neighbors, CPhT-Adv Oncology Pharmacy Patient Advocate [facility] Direct Number: [phone]  Fax: [phone]

## 2023-02-21 ENCOUNTER — Other Ambulatory Visit: Payer: Self-pay

## 2023-03-04 ENCOUNTER — Ambulatory Visit (INDEPENDENT_AMBULATORY_CARE_PROVIDER_SITE_OTHER): Payer: Medicare Other | Admitting: Nurse Practitioner

## 2023-03-04 ENCOUNTER — Encounter: Payer: Self-pay | Admitting: Nurse Practitioner

## 2023-03-04 VITALS — BP 144/92 | HR 78 | Temp 97.0°F | Ht 70.0 in | Wt 222.0 lb

## 2023-03-04 DIAGNOSIS — J069 Acute upper respiratory infection, unspecified: Secondary | ICD-10-CM

## 2023-03-04 NOTE — Progress Notes (Signed)
Subjective:    Patient ID: Justin Gibbs, male    DOB: [birth year], 82 y.o.   MRN: [id number]   Chief Complaint: Weakness and chest congestion   URI  This is a new problem. The current episode started in the past 7 days. The problem has been waxing and waning. There has been no fever. Associated symptoms include congestion, coughing, rhinorrhea and sinus pain. Pertinent negatives include no headaches, sneezing or sore throat. Treatments tried: tylenol PM. The treatment provided mild relief.   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 Negative for sneezing and sore throat.   Respiratory:  Positive for cough.   Neurological:  Negative for headaches.       Objective:   Physical Exam Vitals and nursing note reviewed.  Constitutional:      Appearance: Normal appearance. He is well-developed.  HENT:     Head: Normocephalic.     Right Ear: Tympanic membrane normal.     Left Ear: Tympanic membrane normal.     Nose: Congestion and rhinorrhea present.     Mouth/Throat:     Mouth: Mucous membranes are moist.     Pharynx: Oropharynx is clear.  Eyes:     Pupils: Pupils are equal, round, and reactive to light.  Neck:     Thyroid: No thyroid mass or thyromegaly.     Vascular: No carotid bruit or JVD.     Trachea: Phonation normal.  Cardiovascular:     Rate and Rhythm: Normal rate and regular rhythm.  Pulmonary:     Effort: Pulmonary effort is normal. No  respiratory distress.     Breath sounds: Normal breath sounds.  Abdominal:     General: Bowel sounds are normal.     Palpations: Abdomen is soft.     Tenderness: There is no abdominal tenderness.  Musculoskeletal:        General: Normal range of motion.     Cervical back: Normal range of motion and neck supple.  Lymphadenopathy:     Cervical: No cervical adenopathy.  Skin:    General: Skin is warm and dry.  Neurological:     Mental Status: He is alert and oriented to person, place, and time.  Psychiatric:        Behavior: Behavior normal.        Thought Content: Thought content normal.        Judgment: Judgment normal.    BP (!) 144/92   Pulse 78   Temp (!) 97 F (36.1 C) (Temporal)   Ht 5\' 10"  (1.778 m)   Wt 222 lb (100.7 kg)   SpO2 96%   BMI 31.85 kg/m         Assessment & Plan:   Justin Gibbs in today with chief complaint of Weakness and chest congestion   1. URI with cough and congestion 1. Take meds as prescribed 2. Use a cool mist humidifier especially during the winter months and when heat has been humid. 3. Use  saline nose sprays frequently 4. Saline irrigations of the nose can be very helpful if done frequently.  * 4X daily for 1 week*  * Use of a nettie pot can be helpful with this. Follow directions with this* 5. Drink plenty of fluids 6. Keep thermostat turn down low 7.For any cough or congestion- mucinex 8. For fever or aces or pains- take tylenol or ibuprofen appropriate for age and weight.  * for fevers greater than 101 orally you may alternate ibuprofen and tylenol every  3 hours.       The above assessment and management plan was discussed with the patient. The patient verbalized understanding of and has agreed to the management plan. Patient is aware to call the clinic if symptoms persist or worsen. Patient is aware when to return to the clinic for a follow-up visit. Patient educated on when it is appropriate to go to the emergency  department.   Mary-Margaret Daphine Deutscher, FNP

## 2023-03-04 NOTE — Patient Instructions (Signed)

## 2023-03-19 ENCOUNTER — Inpatient Hospital Stay: Payer: Medicare Other

## 2023-03-19 ENCOUNTER — Inpatient Hospital Stay: Payer: Medicare Other | Attending: Hematology and Oncology

## 2023-03-19 ENCOUNTER — Inpatient Hospital Stay: Payer: Medicare Other | Admitting: Hematology and Oncology

## 2023-03-19 VITALS — BP 178/99 | HR 73 | Temp 97.5°F | Resp 15 | Wt 225.5 lb

## 2023-03-19 DIAGNOSIS — D45 Polycythemia vera: Secondary | ICD-10-CM | POA: Diagnosis not present

## 2023-03-19 DIAGNOSIS — C61 Malignant neoplasm of prostate: Secondary | ICD-10-CM | POA: Diagnosis present

## 2023-03-19 DIAGNOSIS — Z79818 Long term (current) use of other agents affecting estrogen receptors and estrogen levels: Secondary | ICD-10-CM | POA: Diagnosis not present

## 2023-03-19 DIAGNOSIS — C7951 Secondary malignant neoplasm of bone: Secondary | ICD-10-CM | POA: Diagnosis not present

## 2023-03-19 DIAGNOSIS — D75839 Thrombocytosis, unspecified: Secondary | ICD-10-CM

## 2023-03-19 LAB — CMP (CANCER CENTER ONLY)
ALT: 13 U/L (ref 0–44)
AST: 18 U/L (ref 15–41)
Albumin: 4 g/dL (ref 3.5–5.0)
Alkaline Phosphatase: 47 U/L (ref 38–126)
Anion gap: 5 (ref 5–15)
BUN: 22 mg/dL (ref 8–23)
CO2: 35 mmol/L — ABNORMAL HIGH (ref 22–32)
Calcium: 9.3 mg/dL (ref 8.9–10.3)
Chloride: 101 mmol/L (ref 98–111)
Creatinine: 1.09 mg/dL (ref 0.61–1.24)
GFR, Estimated: >60 mL/min (ref >60)
Glucose, Bld: 117 mg/dL — ABNORMAL HIGH (ref 70–99)
Potassium: 3.7 mmol/L (ref 3.5–5.1)
Sodium: 141 mmol/L (ref 135–145)
Total Bilirubin: 1 mg/dL (ref 0.3–1.2)
Total Protein: 6.8 g/dL (ref 6.5–8.1)

## 2023-03-19 LAB — CBC WITH DIFFERENTIAL (CANCER CENTER ONLY)
Abs Immature Granulocytes: 0.02 10*3/uL (ref 0.00–0.07)
Basophils Absolute: 0 10*3/uL (ref 0.0–0.1)
Basophils Relative: 0 %
Eosinophils Absolute: 0 10*3/uL (ref 0.0–0.5)
Eosinophils Relative: 0 %
HCT: 38.7 % — ABNORMAL LOW (ref 39.0–52.0)
Hemoglobin: 13.6 g/dL (ref 13.0–17.0)
Immature Granulocytes: 0 %
Lymphocytes Relative: 9 %
Lymphs Abs: 0.6 10*3/uL — ABNORMAL LOW (ref 0.7–4.0)
MCH: 38 pg — ABNORMAL HIGH (ref 26.0–34.0)
MCHC: 35.1 g/dL (ref 30.0–36.0)
MCV: 108.1 fL — ABNORMAL HIGH (ref 80.0–100.0)
Monocytes Absolute: 0.4 10*3/uL (ref 0.1–1.0)
Monocytes Relative: 5 %
Neutro Abs: 6.3 10*3/uL (ref 1.7–7.7)
Neutrophils Relative %: 86 %
Platelet Count: 265 10*3/uL (ref 150–400)
RBC: 3.58 MIL/uL — ABNORMAL LOW (ref 4.22–5.81)
RDW: 12.9 % (ref 11.5–15.5)
WBC Count: 7.4 10*3/uL (ref 4.0–10.5)
nRBC: 0 % (ref 0.0–0.2)

## 2023-03-19 MED ORDER — LEUPROLIDE ACETATE (3 MONTH) 22.5 MG ~~LOC~~ KIT
22.5000 mg | PACK | Freq: Once | SUBCUTANEOUS | Status: AC
  Administered 2023-03-19: 22.5 mg via SUBCUTANEOUS
  Filled 2023-03-19: qty 22.5

## 2023-03-19 NOTE — Progress Notes (Signed)
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 Leonides Schanz  10/11/2020: started phebotomy q 2 weeks and hydroxyurea 500mg  BID 11/15/2020: WBC 9.0, Hgb 14.9, HCT 44.5%, Plt 384. Holding phlebotomies, patient at target.  02/21/2021: WBC 5.8, Hgb 15.1, Hct 42.8, Plt 208 08/17/2021: WBC 6.6, Hgb 14.3, MCV 110, Plt 228 04/15/2022: WBC 7.2, Hgb 14.3, HCT 39.5, MCV 107.9, Plt 283  #Hx of Prostate Cancer 2006: robotic resection of prostate.  11/16/2019: PSA 0.21([facility])  08/08/2020: PSA 0.36 [facility])  11/28/2021: NM PSMA scan showed widespread skeletal metastatic disease involving calvarium, ribs, spine, pelvis and bilateral proximal femora 12/19/2021: first dose of leuprolide 22.5 mg IM 01/23/2022: added abiraterone 1000 mg with prednisone.  03/07/2022: PSMA scan showed similar to minimal improvement in osseous metastasis  Interval History:  Justin Gibbs 82 y.o. male with medical history significant for polycythemia vera and prostate cancer who presents for a follow up visit. The patient's last visit was on 12/17/2022. In the interim since the last visit the patient has had no major changes in his health.  On exam today Justin Gibbs reports he has been well overall and interim since our last visit.  He  notes that he did have an upper respiratory infection which did resolve.  In the also in the interim since her last visit he had 1 day where he had blood in his urine 3 weeks ago.  He reports it was just a hint of blood at the time, maybe a "thimble full".  He notes he did not have any pain or dysuria at the time this occurred.  It has not occurred since.  He notes that he does feel like he uses the bathroom quite a lot with increased urination.  He notes that he is faithfully taking his hydroxyurea medication without any difficulty.  He is also taking his Zytiga and not having any major side effects.  He has been gaining weight in the interim since her last visit, currently up to 225 pounds from 220.  Overall he feels well and is willing to continue on his respective therapies.  Also of note his blood pressure is elevated today to 178/99 though he reports he checks it at home and the blood pressure is typically fine at that time.   A full 10 point ROS is listed below.  MEDICAL HISTORY:  Past Medical History:  Diagnosis Date   Anxiety    Arthritis    Atrial fibrillation (HCC) 06/2020   Cancer [facility])    prostate / removed 2006   Depression    Dysrhythmia    High triglycerides    Hypertension    Polycythemia vera, acquired (HCC)     SURGICAL HISTORY: Past Surgical History:  Procedure Laterality Date   BIOPSY  08/27/2021   Procedure: BIOPSY;  Surgeon: Lanelle Bal, DO;  [facility];  Service: Endoscopy;;   COLONOSCOPY  WITH PROPOFOL N/A 08/27/2021   Procedure: COLONOSCOPY WITH PROPOFOL;  Surgeon: Lanelle Bal, DO;  Location: [facility];  Service: Endoscopy;  Laterality: N/A;  8:00am   HERNIA REPAIR Bilateral    x 2    POLYPECTOMY  08/27/2021   Procedure: POLYPECTOMY;  Surgeon: Lanelle Bal, DO;  Location: [facility];  Service: Endoscopy;;   PROSTATECTOMY      SOCIAL HISTORY: Social History   Socioeconomic History   Marital status: Married    Spouse name:  Dawn   Number of children: 3   Years of education: Not on file   Highest education level: GED or equivalent  Occupational History   Occupation: retired     Comment: 2007  Tobacco Use   Smoking status: Former    Current packs/day: 0.00    Average packs/day: 1 pack/day for 23.0 years (23.0 ttl pk-yrs)    Types: Cigarettes, Cigars    Start date: 06/13/1959    Quit date: 06/12/1982    Years since quitting: 40.8   Smokeless tobacco: Never   Tobacco comments:    I was a light smoker  Vaping Use   Vaping status: Never Used  Substance and Sexual Activity   Alcohol use: Never   Drug use: Never   Sexual activity: Not Currently    Comment: Prostate removal  Nov. 15 2006  Other Topics Concern   Not on file  Social History Narrative   Lives with wife and son. He has a basement with a wood stove.   Social Determinants of Health   Financial Resource Strain: Low Risk  (03/03/2023)   Overall Financial Resource Strain (CARDIA)    Difficulty of Paying Living Expenses: Not hard at all  Food Insecurity: No Food Insecurity (03/03/2023)   Hunger Vital Sign    Worried About Running Out of Food in the Last Year: Never true    Ran Out of Food in the Last Year: Never true  Transportation Needs: No Transportation Needs (03/03/2023)   PRAPARE - Administrator, Civil Service (Medical): No    Lack of Transportation (Non-Medical): No  Physical Activity: Insufficiently Active (03/03/2023)   Exercise Vital Sign    Days of Exercise per Week: 1 day    Minutes of Exercise per Session: 10 min  Stress: No Stress Concern Present (03/03/2023)   [employer] of Occupational Health - Occupational Stress Questionnaire    Feeling of Stress : Not at all  Social Connections: Unknown (03/03/2023)   Social Connection and Isolation Panel [NHANES]    Frequency of Communication with Friends and Family: Once a week    Frequency of Social Gatherings with Friends and Family: Once a week    Attends Religious  Services: 1 to 4 times per year    Active Member of [employer] or Organizations: Patient declined    Attends Banker Meetings: Patient unable to answer    Marital Status: Married  Catering manager Violence: Not At Risk (02/05/2023)   Humiliation, Afraid, Rape, and Kick questionnaire    Fear of Current or Ex-Partner: No    Emotionally Abused: No    Physically Abused: No    Sexually Abused: No    FAMILY HISTORY: Family History  Problem Relation Age of Onset   Alzheimer's disease Mother    Alcohol abuse Father    Cancer Father        leukemia   Cirrhosis Father    Stomach cancer Father    Stroke  Brother    Diabetes Daughter    Cancer Maternal Grandmother    Cancer Paternal Grandfather    Prostate cancer Brother    Brain cancer Brother    Thyroid cancer Sister     ALLERGIES:  has No Known Allergies.  MEDICATIONS:  Current Outpatient Medications  Medication Sig Dispense Refill   abiraterone acetate (ZYTIGA) 250 MG tablet Take 4 tablets (1,000 mg total) by mouth daily. Take on an empty stomach 1 hour before or 2 hours after a meal 360 tablet 2   apixaban (ELIQUIS) 5 MG TABS tablet TAKE 1 TABLET BY MOUTH TWICE DAILY . APPOINTMENT REQUIRED FOR FUTURE REFILLS 180 tablet 1   hydroxyurea (HYDREA) 500 MG capsule TAKE 1 CAPSULE BY MOUTH TWICE DAILY WITH FOOD TO MINIMIZE GI SIDE EFFECTS 180 capsule 1   lisinopril-hydrochlorothiazide (ZESTORETIC) 20-12.5 MG tablet Take 2 tablets by mouth daily. 180 tablet 1   metoprolol tartrate (LOPRESSOR) 25 MG tablet Take 1 tablet (25 mg total) by mouth 2 (two) times daily. 180 tablet 1   Multiple Vitamin (MULTIVITAMIN) capsule Take 1 capsule by mouth daily.     PARoxetine (PAXIL) 10 MG tablet Take 1/2 (one-half) tablet by mouth twice daily 90 tablet 1   predniSONE (DELTASONE) 5 MG tablet Take 1 tablet (5 mg total) by mouth daily with breakfast. 90 tablet 1   No current facility-administered medications for this visit.    REVIEW OF SYSTEMS:    Constitutional: ( - ) fevers, ( - )  chills , ( - ) night sweats Eyes: ( - ) blurriness of vision, ( - ) double vision, ( - ) watery eyes Ears, nose, mouth, throat, and face: ( - ) mucositis, ( - ) sore throat Respiratory: ( - ) cough, ( - ) dyspnea, ( - ) wheezes Cardiovascular: ( - ) palpitation, ( - ) chest discomfort, ( - ) lower extremity swelling Gastrointestinal:  ( - ) nausea, ( - ) heartburn, ( - ) change in bowel habits Skin: ( - ) abnormal skin rashes Lymphatics: ( - ) new lymphadenopathy, ( - ) easy bruising Neurological: ( - ) numbness, ( - ) tingling, ( - ) new weaknesses Behavioral/Psych: ( - ) mood change, ( - ) new changes  All other systems were reviewed with the patient and are negative.  PHYSICAL EXAMINATION:  Vitals:   03/19/23 1033  BP: (!) 178/99  Pulse: 73  Resp: 15  Temp: (!) 97.5 F (36.4 C)  SpO2: 98%     Filed Weights   03/19/23 1033  Weight: 225 lb 8 oz (102.3 kg)     GENERAL: well appearing elderly Caucasian male alert, no distress and comfortable SKIN: skin color, texture, turgor are normal, no rashes or significant lesions EYES: conjunctiva are pink and non-injected, sclera clear LUNGS: clear to auscultation and percussion with normal breathing effort HEART: regular rate & rhythm and no murmurs. Trace bilateral lower extremity edema Musculoskeletal: no cyanosis of digits and no clubbing  PSYCH: alert & oriented x 3, fluent speech NEURO: no focal motor/sensory deficits  LABORATORY DATA:  I have reviewed the data as listed    Latest Ref Rng & Units 03/19/2023    9:53 AM 01/17/2023   10:23 AM 12/17/2022    9:53 AM  CBC  WBC 4.0 - 10.5 K/uL 7.4  8.4  8.8   Hemoglobin 13.0 - 17.0 g/dL 40.9  81.1  91.4   Hematocrit 39.0 - 52.0 % 38.7  41.6  40.0  Platelets 150 - 400 K/uL 265  313  293        Latest Ref Rng & Units 03/19/2023    9:53 AM 01/17/2023   10:23 AM 12/17/2022    9:53 AM  CMP  Glucose 70 - 99 mg/dL 244  010  272   BUN 8 - 23  mg/dL 22  29  32   Creatinine 0.61 - 1.24 mg/dL 5.36  6.44  0.34   Sodium 135 - 145 mmol/L 141  142  141   Potassium 3.5 - 5.1 mmol/L 3.7  4.3  3.5   Chloride 98 - 111 mmol/L 101  100  100   CO2 22 - 32 mmol/L 35  27  34   Calcium 8.9 - 10.3 mg/dL 9.3  9.2  9.4   Total Protein 6.5 - 8.1 g/dL 6.8  6.8  6.8   Total Bilirubin 0.3 - 1.2 mg/dL 1.0  0.9  0.9   Alkaline Phos 38 - 126 U/L 47  81  55   AST 15 - 41 U/L 18  17  16    ALT 0 - 44 U/L 13  12  13      No results found for: "MPROTEIN" No results found for: "KPAFRELGTCHN", "LAMBDASER", "KAPLAMBRATIO"  Pathology:    RADIOGRAPHIC STUDIES: No results found.  ASSESSMENT & PLAN Justin Gibbs 82 y.o. male with medical history significant for polycythemia vera who presents for a follow up visit.   After review of the labs, the records, and discussion with the patient the findings most consistent with a polycythemia vera which is JAK2 V617 F positive.  These results were confirmed with a bone marrow biopsy.  Given the patient's age greater than 24 he is considered a high risk polycythemia vera and therefore would require cytoreductive therapy with hydroxyurea 500 mg twice daily in addition to every 2 week phlebotomies.  Goal hematocrit for this patient is less than 45%.  Additionally he is on Eliquis therapy which will serve as anticoagulation for thromboprophylaxis.  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  Patient at goal HCT goal of <45% --Today Hgb 13.6, MCV 108.1, hematocrit 38.7, white blood cell count 7.4, and platelets of 265 --patient currently at goal HCT, will hold further phlebotomies unless needed.  --holding ASA 81mg  PO daily as the patient is on eliquis.  -- Continue to monitor while being treated for prostate cancer as below.  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  --11/27/2021: widespread metastatic disease noted on PSMA scan.  --Underwent radiation therapy, completed this on 01/31/2021. 12/19/2021: first dose of leuprolide 22.5 mg IM -- Most recent NM PSMA scan Feb 2024 showed stable disease. Next imaging in August 2024 Plan: -- Currently received lupron injections q 3 months. Administered today, Next due August 2024.  --no indication for zometa therapy at this time --continue zytiga 1000 mg PO daily with 5 mg PO prednisone PO daily.  --PSA and testosterone are castrate <3 at last check with PSA was 0.2.  --RTC in 3 month to re-evaluate.   No orders of the defined types were placed in this encounter.   All questions were answered. The patient knows to call the clinic with any problems, questions or concerns.  I have spent a total of 30 minutes minutes of face-to-face and non-face-to-face time, preparing  to see the patient, performing a medically appropriate examination, counseling and educating the patient,documenting clinical information in the electronic health record,  and care coordination.   Ulysees Barns, MD Department of Hematology/Oncology [facility] Phone: [phone] Pager: [phone] Email: [email or web address]  03/29/2023 12:27 PM

## 2023-03-20 ENCOUNTER — Other Ambulatory Visit: Payer: Self-pay

## 2023-03-20 ENCOUNTER — Other Ambulatory Visit: Payer: Self-pay | Admitting: Hematology and Oncology

## 2023-03-20 ENCOUNTER — Other Ambulatory Visit (HOSPITAL_COMMUNITY): Payer: Self-pay

## 2023-03-20 DIAGNOSIS — C61 Malignant neoplasm of prostate: Secondary | ICD-10-CM

## 2023-03-20 LAB — TESTOSTERONE: Testosterone: <3 ng/dL — ABNORMAL LOW (ref 264–916)

## 2023-03-20 LAB — PROSTATE-SPECIFIC AG, SERUM (LABCORP): Prostate Specific Ag, Serum: 0.2 ng/mL (ref 0.0–4.0)

## 2023-03-20 MED ORDER — ABIRATERONE ACETATE 250 MG PO TABS
1000.0000 mg | ORAL_TABLET | Freq: Every day | ORAL | 2 refills | Status: DC
  Filled 2023-03-20: qty 120, 30d supply, fill #0
  Filled 2023-04-16: qty 120, 30d supply, fill #1
  Filled 2023-05-21: qty 120, 30d supply, fill #2
  Filled 2023-06-18: qty 120, 30d supply, fill #3
  Filled 2023-07-16: qty 120, 30d supply, fill #4
  Filled 2023-08-12: qty 120, 30d supply, fill #5
  Filled 2023-09-17: qty 120, 30d supply, fill #6
  Filled 2023-10-13: qty 120, 30d supply, fill #7
  Filled 2023-11-17: qty 120, 30d supply, fill #8

## 2023-03-20 MED ORDER — PREDNISONE 5 MG PO TABS
5.0000 mg | ORAL_TABLET | Freq: Every day | ORAL | 1 refills | Status: DC
  Filled 2023-03-20: qty 90, 90d supply, fill #0
  Filled 2023-06-18: qty 90, 90d supply, fill #1

## 2023-03-21 ENCOUNTER — Telehealth: Payer: Self-pay | Admitting: Hematology and Oncology

## 2023-03-26 ENCOUNTER — Other Ambulatory Visit (HOSPITAL_COMMUNITY): Payer: Self-pay

## 2023-03-29 ENCOUNTER — Encounter: Payer: Self-pay | Admitting: Hematology and Oncology

## 2023-04-04 ENCOUNTER — Other Ambulatory Visit: Payer: Self-pay

## 2023-04-16 ENCOUNTER — Other Ambulatory Visit (HOSPITAL_COMMUNITY): Payer: Self-pay

## 2023-04-16 ENCOUNTER — Other Ambulatory Visit: Payer: Self-pay

## 2023-04-29 ENCOUNTER — Encounter (HOSPITAL_COMMUNITY): Payer: Self-pay | Admitting: Emergency Medicine

## 2023-04-29 ENCOUNTER — Emergency Department (HOSPITAL_COMMUNITY)
Admission: EM | Admit: 2023-04-29 | Discharge: 2023-04-30 | Disposition: A | Discharge status: Home / Self Care | Payer: Medicare Other | Attending: Emergency Medicine | Admitting: Emergency Medicine

## 2023-04-29 ENCOUNTER — Emergency Department (HOSPITAL_COMMUNITY): Payer: Medicare Other

## 2023-04-29 DIAGNOSIS — Z7901 Long term (current) use of anticoagulants: Secondary | ICD-10-CM | POA: Diagnosis not present

## 2023-04-29 DIAGNOSIS — K429 Umbilical hernia without obstruction or gangrene: Secondary | ICD-10-CM | POA: Diagnosis not present

## 2023-04-29 DIAGNOSIS — R008 Other abnormalities of heart beat: Secondary | ICD-10-CM | POA: Insufficient documentation

## 2023-04-29 DIAGNOSIS — R310 Gross hematuria: Secondary | ICD-10-CM | POA: Diagnosis not present

## 2023-04-29 DIAGNOSIS — K5730 Diverticulosis of large intestine without perforation or abscess without bleeding: Secondary | ICD-10-CM | POA: Diagnosis not present

## 2023-04-29 DIAGNOSIS — Z8546 Personal history of malignant neoplasm of prostate: Secondary | ICD-10-CM | POA: Diagnosis not present

## 2023-04-29 DIAGNOSIS — R319 Hematuria, unspecified: Secondary | ICD-10-CM | POA: Diagnosis present

## 2023-04-29 DIAGNOSIS — K8020 Calculus of gallbladder without cholecystitis without obstruction: Secondary | ICD-10-CM | POA: Diagnosis not present

## 2023-04-29 LAB — CBC
HCT: 40.1 % (ref 39.0–52.0)
Hemoglobin: 13.9 g/dL (ref 13.0–17.0)
MCH: 39 pg — ABNORMAL HIGH (ref 26.0–34.0)
MCHC: 34.7 g/dL (ref 30.0–36.0)
MCV: 112.6 fL — ABNORMAL HIGH (ref 80.0–100.0)
Platelets: 361 10*3/uL (ref 150–400)
RBC: 3.56 MIL/uL — ABNORMAL LOW (ref 4.22–5.81)
RDW: 13.2 % (ref 11.5–15.5)
WBC: 7.9 10*3/uL (ref 4.0–10.5)
nRBC: 0 % (ref 0.0–0.2)

## 2023-04-29 LAB — BASIC METABOLIC PANEL
Anion gap: 12 (ref 5–15)
BUN: 36 mg/dL — ABNORMAL HIGH (ref 8–23)
CO2: 28 mmol/L (ref 22–32)
Calcium: 9.2 mg/dL (ref 8.9–10.3)
Chloride: 99 mmol/L (ref 98–111)
Creatinine, Ser: 1.7 mg/dL — ABNORMAL HIGH (ref 0.61–1.24)
GFR, Estimated: 40 mL/min — ABNORMAL LOW (ref >60)
Glucose, Bld: 148 mg/dL — ABNORMAL HIGH (ref 70–99)
Potassium: 3.5 mmol/L (ref 3.5–5.1)
Sodium: 139 mmol/L (ref 135–145)

## 2023-04-29 MED ORDER — CEPHALEXIN 500 MG PO CAPS
500.0000 mg | ORAL_CAPSULE | Freq: Once | ORAL | Status: AC
  Administered 2023-04-30: 500 mg via ORAL

## 2023-04-29 MED ORDER — SODIUM CHLORIDE 0.9 % IR SOLN
3000.0000 mL | Status: DC
  Administered 2023-04-30: 1000 mL

## 2023-04-29 MED ORDER — CEPHALEXIN 500 MG PO CAPS: 500.0000 mg | ORAL_CAPSULE | Freq: Three times a day (TID) | ORAL | 0 refills | Status: AC

## 2023-04-29 NOTE — Discharge Instructions (Addendum)
You had a Foley catheter placed in emergency department today and your bladder irrigated.  We recommended that you hold your Eliquis dose tonight and resume it tomorrow.  Your urine will likely remain bloody for several days but should slowly clear.  I recommend you call the urologist tomorrow to schedule a follow-up appointment, and likely will have your Foley catheter removed in the office and reexamine.  If you continue having blood in the urine you may need further workup performed by the urologist.  You are also started on antibiotic for the next 7 days to cover for the possibility of a urine infection as the cause of your symptoms.  It is not clear that there is truly an infection, but it will take several days for urine culture results to return.

## 2023-04-29 NOTE — ED Triage Notes (Signed)
Pt passing bright red blood today. He did this a month ago and it stopped. Dr. Leonides Schanz is oncologist. Pt taking chemo for prostate cancer mets to bones. Pt is able to urinate. He took 38 doses or radiation a few years ago to prostate but no radiation recently.

## 2023-04-29 NOTE — ED Provider Notes (Signed)
[facility] AT [facility] Provider Note   CSN: [id number] Arrival date & time: 04/29/23  1926     History {Add pertinent medical, surgical, social history, OB history to HPI:1} Chief Complaint  Patient presents with   Hematuria    Justin Gibbs is a 82 y.o. male with hx of prostate cancer on chemotherapy presenting to ED with painless hematuria.  Onset yesterday, some stinging with urination, frank blood in urine.  Reports this happened 1 month ago and resolved on its own.\   Hx of A Fib on eliquis, compliant with meds  HPI     Home Medications Prior to Admission medications   Medication Sig Start Date End Date Taking? Authorizing Provider  abiraterone acetate (ZYTIGA) 250 MG tablet Take 4 tablets (1,000 mg total) by mouth daily. Take on an empty stomach 1 hour before or 2 hours after a meal 03/20/23  Yes Jaci Standard, MD  apixaban (ELIQUIS) 5 MG TABS tablet TAKE 1 TABLET BY MOUTH TWICE DAILY . APPOINTMENT REQUIRED FOR FUTURE REFILLS Patient taking differently: Take 5 mg by mouth in the morning and at bedtime. 01/17/23  Yes Martin, Mary-Margaret, FNP  cephALEXin (KEFLEX) 500 MG capsule Take 1 capsule (500 mg total) by mouth 3 (three) times daily for 7 days. 04/29/23 05/06/23 Yes Shequilla Goodgame, Kermit Balo, MD  hydroxyurea (HYDREA) 500 MG capsule TAKE 1 CAPSULE BY MOUTH TWICE DAILY WITH FOOD TO MINIMIZE GI SIDE EFFECTS Patient taking differently: Take 500 mg by mouth 2 (two) times daily with a meal. WITH FOOD TO MINIMIZE GI SIDE EFFECTS 01/17/23  Yes Daphine Deutscher, Mary-Margaret, FNP  lisinopril-hydrochlorothiazide (ZESTORETIC) 20-12.5 MG tablet Take 2 tablets by mouth daily. Patient taking differently: Take 1 tablet by mouth in the morning and at bedtime. 01/17/23  Yes Daphine Deutscher, Mary-Margaret, FNP  metoprolol tartrate (LOPRESSOR) 25 MG tablet Take 1 tablet (25 mg total) by mouth 2 (two) times daily. 01/17/23  Yes Daphine Deutscher, Mary-Margaret, FNP  Multiple Vitamin  (MULTIVITAMIN) capsule Take 1 capsule by mouth daily with breakfast.   Yes [provider]  PARoxetine (PAXIL) 10 MG tablet Take 1/2 (one-half) tablet by mouth twice daily Patient taking differently: Take 5 mg by mouth in the morning. 01/17/23  Yes Daphine Deutscher, Mary-Margaret, FNP  predniSONE (DELTASONE) 5 MG tablet Take 1 tablet (5 mg total) by mouth daily with breakfast. 03/20/23  Yes Jaci Standard, MD      Allergies    Patient has no known allergies.    Review of Systems   Review of Systems  Physical Exam Updated Vital Signs BP (!) 151/92 (BP Location: Left Arm)   Pulse 80   Temp 97.7 F (36.5 C) (Oral)   Resp 18   SpO2 100%  Physical Exam Constitutional:      General: He is not in acute distress. HENT:     Head: Normocephalic and atraumatic.  Eyes:     Conjunctiva/sclera: Conjunctivae normal.     Pupils: Pupils are equal, round, and reactive to light.  Cardiovascular:     Rate and Rhythm: Normal rate. Rhythm irregular.  Pulmonary:     Effort: Pulmonary effort is normal. No respiratory distress.  Abdominal:     General: There is no distension.     Tenderness: There is no abdominal tenderness.  Skin:    General: Skin is warm and dry.  Neurological:     General: No focal deficit present.     Mental Status: He is alert. Mental status is  at baseline.  Psychiatric:        Mood and Affect: Mood normal.        Behavior: Behavior normal.     ED Results / Procedures / Treatments   Labs (all labs ordered are listed, but only abnormal results are displayed) Labs Reviewed  BASIC METABOLIC PANEL - Abnormal; Notable for the following components:      Result Value   Glucose, Bld 148 (*)    BUN 36 (*)    Creatinine, Ser 1.70 (*)    GFR, Estimated 40 (*)    All other components within normal limits  CBC - Abnormal; Notable for the following components:   RBC 3.56 (*)    MCV 112.6 (*)    MCH 39.0 (*)    All other components within normal limits  URINE CULTURE   URINALYSIS, ROUTINE W REFLEX MICROSCOPIC    EKG None  Radiology CT Renal Stone Study  Result Date: 04/29/2023 CLINICAL DATA:  Abdominal pain. Concern for kidney stone. Metastatic prostate cancer. EXAM: CT ABDOMEN AND PELVIS WITHOUT CONTRAST TECHNIQUE: Multidetector CT imaging of the abdomen and pelvis was performed following the standard protocol without IV contrast. RADIATION DOSE REDUCTION: This exam was performed according to the departmental dose-optimization program which includes automated exposure control, adjustment of the mA and/or kV according to patient size and/or use of iterative reconstruction technique. COMPARISON:  CT abdomen pelvis dated 09/09/2022. FINDINGS: Evaluation of this exam is limited in the absence of intravenous contrast. Lower chest: There is eventration of the right hemidiaphragm with minimal right lung base atelectasis. The visualized left lungs are clear. Hepatobiliary: Faint subcentimeter hypodense focus in the left lobe of the liver is too small to characterize but demonstrates fluid attenuation, likely a cyst. The liver is otherwise unremarkable. No biliary dilatation. Multiple gallstones. No pericholecystic fluid or evidence of acute cholecystitis by CT. Pancreas: Unremarkable. No pancreatic ductal dilatation or surrounding inflammatory changes. Spleen: Normal in size without focal abnormality. Adrenals/Urinary Tract: The adrenal glands are unremarkable. There is no hydronephrosis or nephrolithiasis on either side. A 1 cm hypodense lesion in the upper pole of the right kidney is not characterized on this noncontrast CT, similar to prior CT, likely a cyst. The visualized ureters appear unremarkable. Indeterminate 2 cm high attenuating content in the posterior bladder lumen may represent blood product. A urothelial lesion is less likely as this was not present on the prior CT but not excluded. Clinical correlation and further evaluation with cystoscopy, if clinically  indicated, recommended. Stomach/Bowel: There is sigmoid diverticulosis without active inflammatory changes. There is no bowel obstruction or active inflammation. The appendix is normal. Vascular/Lymphatic: Mild aortoiliac atherosclerotic disease. The IVC is unremarkable. No portal venous gas. There is no adenopathy. Reproductive: Prostatectomy. Other: Small fat containing umbilical hernia. Bilateral inguinal hernia repair. Musculoskeletal: Degenerative changes of the spine. No acute osseous pathology. IMPRESSION: 1. No hydronephrosis or nephrolithiasis. 2. A 2 cm high attenuating content in the posterior bladder, likely blood product/clot. A urothelial lesion is less likely. 3. Cholelithiasis. 4. Sigmoid diverticulosis. No bowel obstruction. Normal appendix. 5.  Aortic Atherosclerosis (ICD10-I70.0). Electronically Signed   By: Elgie Collard M.D.   On: 04/29/2023 22:25    Procedures Procedures  {Document cardiac monitor, telemetry assessment procedure when appropriate:1}  Medications Ordered in ED Medications  sodium chloride irrigation 0.9 % 3,000 mL (has no administration in time range)  cephALEXin (KEFLEX) capsule 500 mg (has no administration in time range)    ED Course/ Medical Decision Making/  A&P Clinical Course as of 04/29/23 2336  Tue Apr 29, 2023  2312 My reassessment the patient feels he has been unable to urinate or provide urine.  He says he feels that he needs to go but is unable to get urine out.  I suspect this may be related to a blood clot urethral obstruction.  I recommended Foley insertion as well as bladder irrigation.  The patient and his wife are in agreement with the plan.  We will also start him on empiric antibiotics, cephalexin for potential UTI as a cause of his symptoms. [MT]    Clinical Course User Index [MT] Ritamarie Arkin, Kermit Balo, MD   {   Click here for ABCD2, HEART and other calculatorsREFRESH Note before signing :1}                              Medical Decision  Making Amount and/or Complexity of Data Reviewed Labs: ordered. Radiology: ordered.  Risk Prescription drug management.   Patient is here with hematuria, no significant discomfort perhaps some stinging with urination.  This is gross hematuria per his report.  Urine does appear grossly bloody.  Differential include UTI versus prostate cancer versus bladder malignancy versus ureteral colic or stone versus other  No lightheadedness, dizziness, or evidence of active hemorrhage  Labs, UA, CT imaging ordered and personally viewed interpreted, notable for mild increase of creatinine and BUN.  No leukocytosis.  Hemoglobin stable at 13.9.  CT renal stone study showing potential blood clot within the posterior bladder, incidental gallstones and sigmoid diverticulosis, no other emergent findings  Patient otherwise hemodynamically stable.  I do not suspect significant hemorrhage.  No indication for emergent hospitalization.   Foley was placed for potential urinary obstruction and bladder irrigation performed.  Patient was stable throughout.  Keflex was ordered for potential urinary tract infection while awaiting urine culture, urinalysis grossly bloody   I would recommend that he hold his dose of Eliquis this evening, continue drinking plenty of water, annual follow-up with the urologist that he is ready seen in the past.  He may require cystoscopy in the office given the recurrence of these episodes.  He also has an oncologist to follow-up with.  But he is stable for discharge at this time.  {Document critical care time when appropriate:1} {Document review of labs and clinical decision tools ie heart score, Chads2Vasc2 etc:1}  {Document your independent review of radiology images, and any outside records:1} {Document your discussion with family members, caretakers, and with consultants:1} {Document social determinants of health affecting pt's care:1} {Document your decision making why or why not  admission, treatments were needed:1} Final Clinical Impression(s) / ED Diagnoses Final diagnoses:  Gross hematuria    Rx / DC Orders ED Discharge Orders          Ordered    cephALEXin (KEFLEX) 500 MG capsule  3 times daily        04/29/23 2312

## 2023-04-30 ENCOUNTER — Telehealth: Payer: Self-pay | Admitting: Medical Oncology

## 2023-04-30 LAB — URINALYSIS, ROUTINE W REFLEX MICROSCOPIC
Bilirubin Urine: NEGATIVE
Glucose, UA: NEGATIVE mg/dL
Ketones, ur: NEGATIVE mg/dL
Leukocytes,Ua: NEGATIVE
Nitrite: NEGATIVE
Protein, ur: 100 mg/dL — AB
RBC / HPF: >50 RBC/hpf (ref 0–5)
Specific Gravity, Urine: 1.018 (ref 1.005–1.030)
pH: 6 (ref 5.0–8.0)

## 2023-04-30 NOTE — Telephone Encounter (Signed)
Phone call from pt this am. He went to ED yesterday for "blood in my urine".  Per scan-" CT renal stone study showing potential blood clot within the posterior bladder, incidental gallstones and sigmoid diverticulosis, no other emergent findings ". Foley catheter placed , keflex prescribed and referred to Dr Bjorn Pippin.  Next appt with med onc is 11/13.

## 2023-05-01 LAB — URINE CULTURE: Culture: NO GROWTH

## 2023-05-08 ENCOUNTER — Ambulatory Visit: Payer: Medicare Other | Admitting: Family Medicine

## 2023-05-08 ENCOUNTER — Encounter: Payer: Self-pay | Admitting: Family Medicine

## 2023-05-08 VITALS — BP 137/80 | HR 79 | Temp 97.0°F | Ht 70.0 in | Wt 221.0 lb

## 2023-05-08 DIAGNOSIS — R310 Gross hematuria: Secondary | ICD-10-CM | POA: Diagnosis not present

## 2023-05-08 DIAGNOSIS — Z978 Presence of other specified devices: Secondary | ICD-10-CM

## 2023-05-08 DIAGNOSIS — R944 Abnormal results of kidney function studies: Secondary | ICD-10-CM | POA: Diagnosis not present

## 2023-05-08 DIAGNOSIS — C61 Malignant neoplasm of prostate: Secondary | ICD-10-CM

## 2023-05-08 DIAGNOSIS — I480 Paroxysmal atrial fibrillation: Secondary | ICD-10-CM | POA: Diagnosis not present

## 2023-05-08 NOTE — Progress Notes (Signed)
Established Patient Office Visit  Subjective   Patient ID: Justin Gibbs, male    DOB: 07/14/1941  Age: 82 y.o. MRN: [id number]  Chief Complaint  Patient presents with   Hematuria    HPI Justin Gibbs is here for an ER follow up. He presented to [facility] ER on 04/29/23 for painless hematuria x2 days. A foley was placed in the ER as he was having difficulty urinating. Bladder irrigation was performed. CT renal stone study found a likely clot in the bladder and no other acute findings. He was discharged home with keflex for possible UTI and referral to urology. He has a hx of prostate cancer. Urine culture was negative. He has completed keflex. Foley has remained intact. He reports gross hematuria has been intermittent since discharge. For example, urine looked clear for the last 2 days but blood is present in urine today. He denies pain, fever, chills, lightheadedness, dizziness. He is on eliquis for A. Fib. He did hold this for one dose at time of discharge. He has been in contact with urology office to provide update. His appt is scheduled for 05/27/23.     ROS As per HPI.   Objective:     BP 137/80   Pulse 79   Temp (!) 97 F (36.1 C) (Oral)   Ht 5\' 10"  (1.778 m)   Wt 221 lb (100.2 kg)   SpO2 97%   BMI 31.71 kg/m    Physical Exam Vitals and nursing note reviewed.  Constitutional:      General: He is not in acute distress.    Appearance: He is not ill-appearing, toxic-appearing or diaphoretic.  HENT:     Mouth/Throat:     Mouth: Mucous membranes are moist.     Pharynx: Oropharynx is clear.  Cardiovascular:     Rate and Rhythm: Normal rate. Rhythm irregularly irregular.     Heart sounds: Normal heart sounds. No murmur heard. Pulmonary:     Effort: Pulmonary effort is normal. No respiratory distress.     Breath sounds: Normal breath sounds.  Abdominal:     General: There is no distension.     Palpations: Abdomen is soft.     Tenderness: There is no abdominal tenderness. There is  no right CVA tenderness, left CVA tenderness, guarding or rebound.  Genitourinary:    Comments: Foley bag in place. Gross hematuria noted.  Skin:    General: Skin is warm and dry.     Coloration: Skin is not jaundiced.  Neurological:     General: No focal deficit present.     Mental Status: He is alert and oriented to person, place, and time.  Psychiatric:        Mood and Affect: Mood normal.        Behavior: Behavior normal.      No results found for any visits on 05/08/23.    The ASCVD Risk score (Arnett DK, et al., 2019) failed to calculate for the following reasons:   The 2019 ASCVD risk score is only valid for ages 70 to 63    Assessment & Plan:   Justin "Justin Gibbs" was seen today for hematuria.  Diagnoses and all orders for this visit:  Gross hematuria -     CBC with Differential/Platelet  Decreased GFR -     BMP8+EGFR  Foley catheter in place  Paroxysmal atrial fibrillation [facility])  Prostate cancer [facility])   Reviewed ER note, imaging, labs. Gross hematuria present today. Foley in place. Urine culture was  negative. Hx of prostate cancer. Established with oncology. Has follow up with urology on 05/27/23. Denies pain, fever, chills, dizziness today. BP is normal toady. Will have him hold eliquis for 2 days and then resume. Will check CBC and rechec GFR today. Notify urology for new or worsening symptoms.   The patient indicates understanding of these issues and agrees with the plan.   Gabriel Earing, FNP

## 2023-05-09 LAB — BMP8+EGFR
BUN/Creatinine Ratio: 21 (ref 10–24)
BUN: 25 mg/dL (ref 8–27)
CO2: 24 mmol/L (ref 20–29)
Calcium: 9.8 mg/dL (ref 8.6–10.2)
Chloride: 99 mmol/L (ref 96–106)
Creatinine, Ser: 1.18 mg/dL (ref 0.76–1.27)
Glucose: 147 mg/dL — ABNORMAL HIGH (ref 70–99)
Potassium: 4.1 mmol/L (ref 3.5–5.2)
Sodium: 142 mmol/L (ref 134–144)
eGFR: 62 mL/min/{1.73_m2} (ref >59)

## 2023-05-09 LAB — CBC WITH DIFFERENTIAL/PLATELET
Basophils Absolute: 0.1 10*3/uL (ref 0.0–0.2)
Basos: 1 %
EOS (ABSOLUTE): 0 10*3/uL (ref 0.0–0.4)
Eos: 1 %
Hematocrit: 39.1 % (ref 37.5–51.0)
Hemoglobin: 13.2 g/dL (ref 13.0–17.7)
Immature Grans (Abs): 0 10*3/uL (ref 0.0–0.1)
Immature Granulocytes: 0 %
Lymphocytes Absolute: 0.5 10*3/uL — ABNORMAL LOW (ref 0.7–3.1)
Lymphs: 7 %
MCH: 38.7 pg — ABNORMAL HIGH (ref 26.6–33.0)
MCHC: 33.8 g/dL (ref 31.5–35.7)
MCV: 115 fL — ABNORMAL HIGH (ref 79–97)
Monocytes Absolute: 0.5 10*3/uL (ref 0.1–0.9)
Monocytes: 6 %
Neutrophils Absolute: 6.8 10*3/uL (ref 1.4–7.0)
Neutrophils: 85 %
Platelets: 384 10*3/uL (ref 150–450)
RBC: 3.41 x10E6/uL — ABNORMAL LOW (ref 4.14–5.80)
RDW: 12.5 % (ref 11.6–15.4)
WBC: 7.9 10*3/uL (ref 3.4–10.8)

## 2023-05-21 ENCOUNTER — Other Ambulatory Visit: Payer: Self-pay

## 2023-05-21 NOTE — Progress Notes (Signed)
Specialty Pharmacy Refill Coordination Note  Justin Gibbs is a 82 y.o. male contacted today regarding refills of specialty medication(s) Abiraterone Acetate   Patient requested Delivery   Delivery date: 05/30/23   Verified address: [address]   [place] [id number]   Medication will be filled on 05/29/23.

## 2023-05-27 DIAGNOSIS — R310 Gross hematuria: Secondary | ICD-10-CM | POA: Diagnosis not present

## 2023-06-02 DIAGNOSIS — N3041 Irradiation cystitis with hematuria: Secondary | ICD-10-CM | POA: Diagnosis not present

## 2023-06-11 ENCOUNTER — Inpatient Hospital Stay: Payer: Medicare Other | Attending: Hematology and Oncology

## 2023-06-11 ENCOUNTER — Inpatient Hospital Stay: Payer: Medicare Other

## 2023-06-11 ENCOUNTER — Encounter: Payer: Self-pay | Admitting: Cardiology

## 2023-06-11 ENCOUNTER — Inpatient Hospital Stay: Payer: Medicare Other | Admitting: Hematology and Oncology

## 2023-06-11 VITALS — BP 153/102 | HR 72 | Temp 97.3°F | Resp 18 | Wt 217.7 lb

## 2023-06-11 DIAGNOSIS — C61 Malignant neoplasm of prostate: Secondary | ICD-10-CM

## 2023-06-11 DIAGNOSIS — D45 Polycythemia vera: Secondary | ICD-10-CM | POA: Insufficient documentation

## 2023-06-11 DIAGNOSIS — Z5111 Encounter for antineoplastic chemotherapy: Secondary | ICD-10-CM | POA: Diagnosis not present

## 2023-06-11 DIAGNOSIS — Z7901 Long term (current) use of anticoagulants: Secondary | ICD-10-CM | POA: Insufficient documentation

## 2023-06-11 DIAGNOSIS — Z7964 Long term (current) use of myelosuppressive agent: Secondary | ICD-10-CM | POA: Diagnosis not present

## 2023-06-11 DIAGNOSIS — Z7982 Long term (current) use of aspirin: Secondary | ICD-10-CM | POA: Diagnosis not present

## 2023-06-11 DIAGNOSIS — Z9079 Acquired absence of other genital organ(s): Secondary | ICD-10-CM | POA: Insufficient documentation

## 2023-06-11 DIAGNOSIS — D75839 Thrombocytosis, unspecified: Secondary | ICD-10-CM | POA: Diagnosis not present

## 2023-06-11 DIAGNOSIS — C7951 Secondary malignant neoplasm of bone: Secondary | ICD-10-CM | POA: Insufficient documentation

## 2023-06-11 DIAGNOSIS — D72829 Elevated white blood cell count, unspecified: Secondary | ICD-10-CM | POA: Insufficient documentation

## 2023-06-11 DIAGNOSIS — Z79899 Other long term (current) drug therapy: Secondary | ICD-10-CM | POA: Diagnosis not present

## 2023-06-11 DIAGNOSIS — Z87891 Personal history of nicotine dependence: Secondary | ICD-10-CM | POA: Diagnosis not present

## 2023-06-11 DIAGNOSIS — I4891 Unspecified atrial fibrillation: Secondary | ICD-10-CM | POA: Insufficient documentation

## 2023-06-11 DIAGNOSIS — Z7952 Long term (current) use of systemic steroids: Secondary | ICD-10-CM | POA: Diagnosis not present

## 2023-06-11 DIAGNOSIS — Z923 Personal history of irradiation: Secondary | ICD-10-CM | POA: Diagnosis not present

## 2023-06-11 LAB — CBC WITH DIFFERENTIAL (CANCER CENTER ONLY)
Abs Immature Granulocytes: 0.04 10*3/uL (ref 0.00–0.07)
Basophils Absolute: 0 10*3/uL (ref 0.0–0.1)
Basophils Relative: 1 %
Eosinophils Absolute: 0 10*3/uL (ref 0.0–0.5)
Eosinophils Relative: 1 %
HCT: 38.1 % — ABNORMAL LOW (ref 39.0–52.0)
Hemoglobin: 13.6 g/dL (ref 13.0–17.0)
Immature Granulocytes: 1 %
Lymphocytes Relative: 11 %
Lymphs Abs: 0.7 10*3/uL (ref 0.7–4.0)
MCH: 39.4 pg — ABNORMAL HIGH (ref 26.0–34.0)
MCHC: 35.7 g/dL (ref 30.0–36.0)
MCV: 110.4 fL — ABNORMAL HIGH (ref 80.0–100.0)
Monocytes Absolute: 0.3 10*3/uL (ref 0.1–1.0)
Monocytes Relative: 5 %
Neutro Abs: 5.4 10*3/uL (ref 1.7–7.7)
Neutrophils Relative %: 81 %
Platelet Count: 280 10*3/uL (ref 150–400)
RBC: 3.45 MIL/uL — ABNORMAL LOW (ref 4.22–5.81)
RDW: 13.5 % (ref 11.5–15.5)
WBC Count: 6.6 10*3/uL (ref 4.0–10.5)
nRBC: 0 % (ref 0.0–0.2)

## 2023-06-11 LAB — CMP (CANCER CENTER ONLY)
ALT: 12 U/L (ref 0–44)
AST: 16 U/L (ref 15–41)
Albumin: 4 g/dL (ref 3.5–5.0)
Alkaline Phosphatase: 54 U/L (ref 38–126)
Anion gap: 6 (ref 5–15)
BUN: 31 mg/dL — ABNORMAL HIGH (ref 8–23)
CO2: 33 mmol/L — ABNORMAL HIGH (ref 22–32)
Calcium: 9.6 mg/dL (ref 8.9–10.3)
Chloride: 101 mmol/L (ref 98–111)
Creatinine: 1.21 mg/dL (ref 0.61–1.24)
GFR, Estimated: >60 mL/min (ref >60)
Glucose, Bld: 128 mg/dL — ABNORMAL HIGH (ref 70–99)
Potassium: 4.4 mmol/L (ref 3.5–5.1)
Sodium: 140 mmol/L (ref 135–145)
Total Bilirubin: 0.9 mg/dL (ref <1.2)
Total Protein: 6.9 g/dL (ref 6.5–8.1)

## 2023-06-11 MED ORDER — LEUPROLIDE ACETATE (3 MONTH) 22.5 MG ~~LOC~~ KIT
22.5000 mg | PACK | Freq: Once | SUBCUTANEOUS | Status: AC
  Administered 2023-06-11: 22.5 mg via SUBCUTANEOUS
  Filled 2023-06-11: qty 22.5

## 2023-06-11 NOTE — Telephone Encounter (Signed)
Forwarding question to Dr Antoine Poche

## 2023-06-11 NOTE — Progress Notes (Signed)
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 Leonides Schanz  10/11/2020: started phebotomy q 2 weeks and hydroxyurea 500mg  BID 11/15/2020: WBC 9.0, Hgb 14.9, HCT 44.5%, Plt 384. Holding phlebotomies, patient at target.  02/21/2021: WBC 5.8, Hgb 15.1, Hct 42.8, Plt 208 08/17/2021: WBC 6.6, Hgb 14.3, MCV 110, Plt 228 04/15/2022: WBC 7.2, Hgb 14.3, HCT 39.5, MCV 107.9, Plt 283  #Hx of Prostate Cancer 2006: robotic resection of prostate.  11/16/2019: PSA 0.21([facility])  08/08/2020: PSA 0.36 [facility])  11/28/2021: NM PSMA scan showed widespread skeletal metastatic disease involving calvarium, ribs, spine, pelvis and bilateral proximal femora 12/19/2021: first dose of leuprolide 22.5 mg IM 01/23/2022: added abiraterone 1000 mg with prednisone.  03/07/2022: PSMA scan showed similar to minimal improvement in osseous metastasis  Interval History:  Justin Gibbs 82 y.o. male with medical history significant for polycythemia vera and prostate cancer who presents for a follow up visit. The patient's last visit was on 03/19/2023. In the interim since the last visit the patient has had no major changes in his health.  On exam today Justin Gibbs reports a rough interim since our last visit where he had blood in the  urine.  He reports that he was seen in the emergency department for this and had a Foley catheter placed.  He reports he underwent cystoscopy and there was a bleeding blood vessel.  He was scheduled for cauterization but the bleeding stopped on its own.  He reports that he is delighted to have the Foley catheter out as it "was stretching things out and shaping them".  He reports that he has had his Eliquis on hold since that time.  He reports he continues taking Zytiga and prednisone without any difficulty.  He does endorse having some occasional episodes of constipation.  He reports when he takes Dulcolax he is able to "clean himself out".  He notes that otherwise he feels quite well and is intentionally losing weight.  He is down to 217 pounds from 225 pounds on 03/19/2023.  Overall he feels well and is willing to continue on his respective therapies.  Also of note his blood pressure is elevated today to 153/102 though he reports he checks it at home and the blood pressure is typically fine at that time.   A full 10 point ROS is listed below.  MEDICAL HISTORY:  Past Medical History:  Diagnosis Date   Anxiety    Arthritis    Atrial fibrillation (HCC) 06/2020   Cancer [facility])    prostate / removed 2006   Depression    Dysrhythmia    High triglycerides    Hypertension    Polycythemia vera, acquired (HCC)     SURGICAL HISTORY: Past Surgical History:  Procedure Laterality Date   BIOPSY  08/27/2021   Procedure: BIOPSY;  Surgeon: Lanelle Bal, DO;  [facility];  Service: Endoscopy;;  COLONOSCOPY WITH PROPOFOL N/A 08/27/2021   Procedure: COLONOSCOPY WITH PROPOFOL;  Surgeon: Lanelle Bal, DO;  [facility];  Service: Endoscopy;  Laterality: N/A;  8:00am   HERNIA REPAIR Bilateral    x 2    POLYPECTOMY  08/27/2021   Procedure: POLYPECTOMY;  Surgeon: Lanelle Bal, DO;  Location: [facility];  Service: Endoscopy;;   PROSTATECTOMY      SOCIAL HISTORY: Social  History   Socioeconomic History   Marital status: Married    Spouse name: Dawn   Number of children: 3   Years of education: Not on file   Highest education level: GED or equivalent  Occupational History   Occupation: retired     Comment: 2007  Tobacco Use   Smoking status: Former    Current packs/day: 0.00    Average packs/day: 1 pack/day for 23.0 years (23.0 ttl pk-yrs)    Types: Cigarettes, Cigars    Start date: 06/13/1959    Quit date: 06/12/1982    Years since quitting: 41.0   Smokeless tobacco: Never   Tobacco comments:    I was a light smoker  Vaping Use   Vaping status: Never Used  Substance and Sexual Activity   Alcohol use: Never   Drug use: Never   Sexual activity: Not Currently    Comment: Prostate removal  Nov. 15 2006  Other Topics Concern   Not on file  Social History Narrative   Lives with wife and son. He has a basement with a wood stove.   Social Determinants of Health   Financial Resource Strain: Low Risk  (03/03/2023)   Overall Financial Resource Strain (CARDIA)    Difficulty of Paying Living Expenses: Not hard at all  Food Insecurity: No Food Insecurity (03/03/2023)   Hunger Vital Sign    Worried About Running Out of Food in the Last Year: Never true    Ran Out of Food in the Last Year: Never true  Transportation Needs: No Transportation Needs (03/03/2023)   PRAPARE - Administrator, Civil Service (Medical): No    Lack of Transportation (Non-Medical): No  Physical Activity: Insufficiently Active (03/03/2023)   Exercise Vital Sign    Days of Exercise per Week: 1 day    Minutes of Exercise per Session: 10 min  Stress: No Stress Concern Present (03/03/2023)   [employer] of Occupational Health - Occupational Stress Questionnaire    Feeling of Stress : Not at all  Social Connections: Unknown (03/03/2023)   Social Connection and Isolation Panel [NHANES]    Frequency of Communication with Friends and Family: Once a week    Frequency of  Social Gatherings with Friends and Family: Once a week    Attends Religious Services: 1 to 4 times per year    Active Member of [employer] or Organizations: Patient declined    Attends Banker Meetings: Patient unable to answer    Marital Status: Married  Catering manager Violence: Not At Risk (02/05/2023)   Humiliation, Afraid, Rape, and Kick questionnaire    Fear of Current or Ex-Partner: No    Emotionally Abused: No    Physically Abused: No    Sexually Abused: No    FAMILY HISTORY: Family History  Problem Relation Age of Onset   Alzheimer's disease Mother    Alcohol abuse Father    Cancer Father        leukemia   Cirrhosis Father    Stomach cancer Father  Stroke Brother    Diabetes Daughter    Cancer Maternal Grandmother    Cancer Paternal Grandfather    Prostate cancer Brother    Brain cancer Brother    Thyroid cancer Sister     ALLERGIES:  has No Known Allergies.  MEDICATIONS:  Current Outpatient Medications  Medication Sig Dispense Refill   abiraterone acetate (ZYTIGA) 250 MG tablet Take 4 tablets (1,000 mg total) by mouth daily. Take on an empty stomach 1 hour before or 2 hours after a meal 360 tablet 2   apixaban (ELIQUIS) 5 MG TABS tablet TAKE 1 TABLET BY MOUTH TWICE DAILY . APPOINTMENT REQUIRED FOR FUTURE REFILLS (Patient taking differently: Take 5 mg by mouth in the morning and at bedtime.) 180 tablet 1   hydroxyurea (HYDREA) 500 MG capsule TAKE 1 CAPSULE BY MOUTH TWICE DAILY WITH FOOD TO MINIMIZE GI SIDE EFFECTS (Patient taking differently: Take 500 mg by mouth 2 (two) times daily with a meal. WITH FOOD TO MINIMIZE GI SIDE EFFECTS) 180 capsule 1   lisinopril-hydrochlorothiazide (ZESTORETIC) 20-12.5 MG tablet Take 2 tablets by mouth daily. (Patient taking differently: Take 1 tablet by mouth in the morning and at bedtime.) 180 tablet 1   metoprolol tartrate (LOPRESSOR) 25 MG tablet Take 1 tablet (25 mg total) by mouth 2 (two) times daily. 180 tablet 1    Multiple Vitamin (MULTIVITAMIN) capsule Take 1 capsule by mouth daily with breakfast.     PARoxetine (PAXIL) 10 MG tablet Take 1/2 (one-half) tablet by mouth twice daily (Patient taking differently: Take 5 mg by mouth in the morning.) 90 tablet 1   predniSONE (DELTASONE) 5 MG tablet Take 1 tablet (5 mg total) by mouth daily with breakfast. 90 tablet 1   No current facility-administered medications for this visit.    REVIEW OF SYSTEMS:   Constitutional: ( - ) fevers, ( - )  chills , ( - ) night sweats Eyes: ( - ) blurriness of vision, ( - ) double vision, ( - ) watery eyes Ears, nose, mouth, throat, and face: ( - ) mucositis, ( - ) sore throat Respiratory: ( - ) cough, ( - ) dyspnea, ( - ) wheezes Cardiovascular: ( - ) palpitation, ( - ) chest discomfort, ( - ) lower extremity swelling Gastrointestinal:  ( - ) nausea, ( - ) heartburn, ( - ) change in bowel habits Skin: ( - ) abnormal skin rashes Lymphatics: ( - ) new lymphadenopathy, ( - ) easy bruising Neurological: ( - ) numbness, ( - ) tingling, ( - ) new weaknesses Behavioral/Psych: ( - ) mood change, ( - ) new changes  All other systems were reviewed with the patient and are negative.  PHYSICAL EXAMINATION:  Vitals:   06/11/23 1100  BP: (!) 153/102  Pulse: 72  Resp: 18  Temp: (!) 97.3 F (36.3 C)  SpO2: 98%      Filed Weights   06/11/23 1100  Weight: 217 lb 11.2 oz (98.7 kg)      GENERAL: well appearing elderly Caucasian male alert, no distress and comfortable SKIN: skin color, texture, turgor are normal, no rashes or significant lesions EYES: conjunctiva are pink and non-injected, sclera clear LUNGS: clear to auscultation and percussion with normal breathing effort HEART: regular rate & rhythm and no murmurs. Trace bilateral lower extremity edema Musculoskeletal: no cyanosis of digits and no clubbing  PSYCH: alert & oriented x 3, fluent speech NEURO: no focal motor/sensory deficits  LABORATORY DATA:  I have  reviewed the  data as listed    Latest Ref Rng & Units 06/11/2023   10:35 AM 05/08/2023   11:24 AM 04/29/2023    9:27 PM  CBC  WBC 4.0 - 10.5 K/uL 6.6  7.9  7.9   Hemoglobin 13.0 - 17.0 g/dL 09.8  11.9  14.7   Hematocrit 39.0 - 52.0 % 38.1  39.1  40.1   Platelets 150 - 400 K/uL 280  384  361        Latest Ref Rng & Units 06/11/2023   10:35 AM 05/08/2023   11:24 AM 04/29/2023    9:27 PM  CMP  Glucose 70 - 99 mg/dL 829  562  130   BUN 8 - 23 mg/dL 31  25  36   Creatinine 0.61 - 1.24 mg/dL 8.65  7.84  6.96   Sodium 135 - 145 mmol/L 140  142  139   Potassium 3.5 - 5.1 mmol/L 4.4  4.1  3.5   Chloride 98 - 111 mmol/L 101  99  99   CO2 22 - 32 mmol/L 33  24  28   Calcium 8.9 - 10.3 mg/dL 9.6  9.8  9.2   Total Protein 6.5 - 8.1 g/dL 6.9     Total Bilirubin <1.2 mg/dL 0.9     Alkaline Phos 38 - 126 U/L 54     AST 15 - 41 U/L 16     ALT 0 - 44 U/L 12       No results found for: "MPROTEIN" No results found for: "KPAFRELGTCHN", "LAMBDASER", "KAPLAMBRATIO"  Pathology:    RADIOGRAPHIC STUDIES: No results found.  ASSESSMENT & PLAN Justin Gibbs 82 y.o. male with medical history significant for polycythemia vera who presents for a follow up visit.   After review of the labs, the records, and discussion with the patient the findings most consistent with a polycythemia vera which is JAK2 V617 F positive.  These results were confirmed with a bone marrow biopsy.  Given the patient's age greater than 61 he is considered a high risk polycythemia vera and therefore would require cytoreductive therapy with hydroxyurea 500 mg twice daily in addition to every 2 week phlebotomies.  Goal hematocrit for this patient is less than 45%.  Additionally he is on Eliquis therapy which will serve as anticoagulation for thromboprophylaxis.  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  Patient at goal HCT goal of <45% --Today Hgb 13.6, hematocrit 38.1%, MCV 110.4, white blood cell count 6.6, platelets 280 --patient currently at goal HCT, will hold further phlebotomies unless needed.  --holding ASA 81mg  PO daily as the patient is on eliquis.  Eliquis was temporarily on hold due to blood in the urine.  Patient will be reaching out to his cardiologist to discuss restarting. -- Continue to monitor while being treated for prostate cancer as below.  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  --11/27/2021: widespread metastatic disease noted on PSMA scan.  --Underwent radiation therapy, completed this on 01/31/2021. 12/19/2021: first dose of leuprolide 22.5 mg IM -- Most recent NM PSMA scan Feb 2024 showed stable disease. Next imaging in August 2024 Plan: -- Currently received lupron injections q 3 months. Administered today, Next due August 2024.  --no indication for zometa therapy at this time --continue zytiga 1000 mg PO daily with 5 mg  PO prednisone PO daily.  --PSA and testosterone are castrate <3 at last check with PSA was 0.2.  --RTC in 3 month to re-evaluate.   No orders of the defined types were placed in this encounter.   All questions were answered. The patient knows to call the clinic with any problems, questions or concerns.  I have spent a total of 30 minutes minutes of face-to-face and non-face-to-face time, preparing to see the patient, performing a medically appropriate examination, counseling and educating the patient,documenting clinical information in the electronic health record,  and care coordination.   Ulysees Barns, MD Department of Hematology/Oncology [facility] Phone: [phone] Pager: [phone] Email: [email or web address]  06/11/2023 2:00 PM

## 2023-06-12 LAB — PROSTATE-SPECIFIC AG, SERUM (LABCORP): Prostate Specific Ag, Serum: 0.2 ng/mL (ref 0.0–4.0)

## 2023-06-12 LAB — TESTOSTERONE: Testosterone: <3 ng/dL — ABNORMAL LOW (ref 264–916)

## 2023-06-16 NOTE — Telephone Encounter (Signed)
I suspect that we will have a cancellation in [place] before the end of the year.  Please let the schedulers know to slide him in to one of those. Thanks. Leta Jungling

## 2023-06-17 NOTE — Progress Notes (Unsigned)
Cardiology Office Note:   Date:  06/18/2023  ID:  Justin Gibbs, DOB 02/24/1941, MRN [id number] PCP: Justin Pierini, Justin Gibbs  [facility] Providers Cardiologist:  Rollene Rotunda, MD {  History of Present Illness:   Justin Gibbs is a 82 y.o. male who is referred for evaluation of atrial fib.  Since he was last seen he has had a lot of problems with his bladder.  I saw an ER visit in early October.  He had some hematuria.  Eliquis was stopped and he had a Foley placed.  He said he went home with this for almost a month and had continued hematuria the entire time.  He went to see urology and they took that out at the end of October.  He had a scope eventually by Justin Gibbs and he was told that there was a bleeding vessel and he was offered cauterization.  This was not urgent and he did not have this done and he did have resolution of his hematuria and he has since been restarted on his Eliquis for the last few days.  Unfortunately he has some residual incontinence.  He is having bladder cancer before with radiation.  He denies any cardiovascular symptoms.  He is have some chronic dyspnea with exertion.  This is walking the 175 feet to his mailbox but this is unchanged from last year.  He is not having any resting shortness of breath, PND or orthopnea.  He says his oxygen saturations are in the mid 90s when he short of breath.  He walks with a cane and walks slowly.  He is not describing any chest pressure, neck or arm discomfort.  He really does not feel his fibrillation.  He has not had any presyncope or syncope.  ROS: As stated in the HPI and negative for all other systems.   Studies Reviewed:    EKG:   EKG Interpretation Date/Time:  Wednesday June 18 2023 10:53:36 EST Ventricular Rate:  81 PR Interval:    QRS Duration:  74 QT Interval:  376 QTC Calculation: 436 R Axis:   35  Text Interpretation: Atrial fibrillation ST & T wave abnormality, consider anterolateral ischemia  When compared with ECG of 23-Aug-2021 10:09, Vent. rate has increased BY  30 BPM ST now depressed in Anterior leads Inverted T waves have replaced nonspecific T wave abnormality in Anterolateral leads Confirmed by Rollene Rotunda ([id number]) on 06/18/2023 11:25:52 AM    Risk Assessment/Calculations:    CHA2DS2-VASc Score = 3   This indicates a 3.2% annual risk of stroke. The patient's score is based upon: CHF History: 0 HTN History: 1 Diabetes History: 0 Stroke History: 0 Vascular Disease History: 0 Age Score: 2 Gender Score: 0  Physical Exam:   VS:  BP 130/88   Pulse 81   Ht 5\' 10"  (1.778 m)   Wt 216 lb (98 kg)   BMI 30.99 kg/m    Wt Readings from Last 3 Encounters:  06/18/23 216 lb (98 kg)  06/11/23 217 lb 11.2 oz (98.7 kg)  05/08/23 221 lb (100.2 kg)     GEN: Well nourished, well developed in no acute distress NECK: No JVD; No carotid bruits CARDIAC: Irregular RR, no murmurs, rubs, gallops RESPIRATORY:  Clear to auscultation without rales, wheezing or rhonchi  ABDOMEN: Soft, non-tender, non-distended EXTREMITIES:  No edema; No deformity   ASSESSMENT AND PLAN:   Atrial fib: We had a long discussion about this.  He is back on his Eliquis and  seems to be not having hematuria.  If he does we could consider Watchman.  I described this procedure to him.  He would let me know if he has any recurrent hematuria.  He has good rate control and otherwise does not have any issues with the atrial fibrillation.  No change in therapy.    HTN:    His blood pressure is at target.  No change in therapy.  AKI:   Creatinine was 1.21 most recently.  No change in therapy.  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  I do not think his exam is unchanged.  No further imaging.  I will consider this based on symptoms in the future.  Shortness of breath: This is chronic.  He seems to be euvolemic.  He had a well-preserved ejection fraction.  He has weight and deconditioning which is likely the  contributory cause.  He is not having any resting shortness of breath.  No change in therapy.     Follow up with me in one year.   Signed, Rollene Rotunda, MD

## 2023-06-18 ENCOUNTER — Other Ambulatory Visit: Payer: Self-pay

## 2023-06-18 ENCOUNTER — Other Ambulatory Visit: Payer: Self-pay | Admitting: *Deleted

## 2023-06-18 ENCOUNTER — Ambulatory Visit: Payer: Medicare Other | Admitting: Cardiology

## 2023-06-18 ENCOUNTER — Encounter: Payer: Self-pay | Admitting: Cardiology

## 2023-06-18 VITALS — BP 130/88 | HR 81 | Ht 70.0 in | Wt 216.0 lb

## 2023-06-18 DIAGNOSIS — I10 Essential (primary) hypertension: Secondary | ICD-10-CM

## 2023-06-18 DIAGNOSIS — I480 Paroxysmal atrial fibrillation: Secondary | ICD-10-CM

## 2023-06-18 DIAGNOSIS — N179 Acute kidney failure, unspecified: Secondary | ICD-10-CM | POA: Diagnosis not present

## 2023-06-18 MED ORDER — APIXABAN 5 MG PO TABS: 5.0000 mg | ORAL_TABLET | Freq: Two times a day (BID) | ORAL | 6 refills | Status: DC

## 2023-06-18 NOTE — Patient Instructions (Signed)

## 2023-06-18 NOTE — Progress Notes (Signed)
Specialty Pharmacy Refill Coordination Note  Justin Gibbs is a 82 y.o. male contacted today regarding refills of specialty medication(s) Abiraterone Acetate   Patient requested Delivery   Delivery date: 06/25/23   Verified address: [address]   [place] [address]   Medication will be filled on 06/24/23.

## 2023-06-24 ENCOUNTER — Other Ambulatory Visit (HOSPITAL_COMMUNITY): Payer: Self-pay

## 2023-06-24 ENCOUNTER — Telehealth: Payer: Self-pay | Admitting: Pharmacy Technician

## 2023-06-24 ENCOUNTER — Encounter: Payer: Self-pay | Admitting: Hematology and Oncology

## 2023-06-24 ENCOUNTER — Other Ambulatory Visit: Payer: Self-pay

## 2023-06-24 NOTE — Telephone Encounter (Signed)
Oral Oncology Patient Advocate Encounter  Was successful in securing patient a $8,000 grant from [facility] to provide copayment coverage for abiraterone.  This will keep the out of pocket expense at $0.     [employer] ID: [id number]  I have spoken with the patient.   The billing information is as follows and has been shared with [facility].    RxBin: [id number] PCN: [id number] Member ID: [id number] Group ID: [id number] Dates of Eligibility: 05/25/23 through 05/23/24  Fund:  Prostate  Jinger Neighbors, CPhT-Adv Oncology Pharmacy Patient Advocate [facility] Direct Number: [phone]  Fax: [phone]

## 2023-07-16 ENCOUNTER — Other Ambulatory Visit: Payer: Self-pay

## 2023-07-16 NOTE — Progress Notes (Signed)
Specialty Pharmacy Refill Coordination Note  Justin Gibbs is a 82 y.o. male contacted today regarding refills of specialty medication(s) Abiraterone Acetate (ZYTIGA)   Patient requested Delivery   Delivery date: 07/25/23   Verified address: [address]   [place] [address]   Medication will be filled on 07/24/23.

## 2023-07-17 ENCOUNTER — Other Ambulatory Visit (HOSPITAL_COMMUNITY): Payer: Self-pay

## 2023-07-21 ENCOUNTER — Ambulatory Visit: Payer: Medicare Other | Admitting: Nurse Practitioner

## 2023-07-26 ENCOUNTER — Other Ambulatory Visit: Payer: Self-pay | Admitting: Nurse Practitioner

## 2023-07-26 DIAGNOSIS — I480 Paroxysmal atrial fibrillation: Secondary | ICD-10-CM

## 2023-08-12 ENCOUNTER — Other Ambulatory Visit (HOSPITAL_COMMUNITY): Payer: Self-pay

## 2023-08-12 NOTE — Progress Notes (Signed)
 Specialty Pharmacy Refill Coordination Note  Justin Gibbs is a 83 y.o. male contacted today regarding refills of specialty medication(s) Abiraterone  Acetate (ZYTIGA )   Patient requested Delivery   Delivery date: 08/26/23   Verified address: Patient address [address]  [place]  [address]   Medication will be filled on 08/25/23 *Using PANF Lor[facility]*.

## 2023-08-25 ENCOUNTER — Other Ambulatory Visit: Payer: Self-pay

## 2023-09-09 NOTE — Progress Notes (Unsigned)
 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 Leonides Schanz  10/11/2020: started phebotomy q 2 weeks and hydroxyurea 500mg  BID 11/15/2020: WBC 9.0, Hgb 14.9, HCT 44.5%, Plt 384. Holding phlebotomies, patient at target.  02/21/2021: WBC 5.8, Hgb 15.1, Hct 42.8, Plt 208 08/17/2021: WBC 6.6, Hgb 14.3, MCV 110, Plt 228 04/15/2022: WBC 7.2, Hgb 14.3, HCT 39.5, MCV 107.9, Plt 283  #Hx of Prostate Cancer 2006: robotic resection of prostate.  11/16/2019: PSA 0.21([facility])  08/08/2020: PSA 0.36 [facility])  11/28/2021: NM PSMA scan showed widespread skeletal metastatic disease involving calvarium, ribs, spine, pelvis and bilateral proximal femora 12/19/2021: first dose of leuprolide 22.5 mg IM 01/23/2022: added abiraterone 1000 mg with prednisone.  03/07/2022: PSMA scan showed similar to minimal improvement in osseous metastasis  Interval History:  Justin Gibbs 83 y.o. male with medical history significant for polycythemia vera and prostate cancer who presents for a follow up visit. The patient's last visit was on 06/11/2023. In the interim since the last visit the patient has had no major changes in his health.  On exam today Justin Gibbs reports he has been well overall interim since her last visit.  He  reports that he now has a urinary catheter in place with a bag on his leg.  He reports that after they have the tube in his bladder "he could not stop the".  He reports that he was trying pads but they got to soaking wet.  He notes he continues to follow with urology.  He notes that he has not had any blood or pink/red in his urine.  He reports that he did sit out in the sun on Sunday and unfortunately developed a rash on both his upper extremities bilaterally.  He continues taking his hydroxyurea as prescribed with no nausea, vomiting, or diarrhea.  He reports his energy levels are "okay".  He notes he has been eating well.  Overall he feels well and is willing to continue on his respective therapies.  Also of note his blood pressure is elevated today to 150/80 though he reports he checks it at home and the blood pressure is typically fine at that time.   A full 10 point ROS is listed below.  MEDICAL HISTORY:  Past Medical History:  Diagnosis Date   Anxiety    Arthritis    Atrial fibrillation (HCC) 06/2020   Cancer (HCC)    prostate / removed 2006   Depression    Dysrhythmia    High triglycerides    Hypertension    Polycythemia vera, acquired (HCC)     SURGICAL HISTORY: Past Surgical History:  Procedure Laterality Date   BIOPSY  08/27/2021   Procedure: BIOPSY;  Surgeon: Lanelle Bal, DO;  [facility];  Service: Endoscopy;;   COLONOSCOPY WITH PROPOFOL N/A 08/27/2021   Procedure: COLONOSCOPY WITH PROPOFOL;  Surgeon: Lanelle Bal, DO;  [facility];  Service: Endoscopy;  Laterality: N/A;  8:00am   HERNIA REPAIR Bilateral    x 2    POLYPECTOMY  08/27/2021   Procedure: POLYPECTOMY;  Surgeon: Lanelle Bal, DO;  [facility];  Service: Endoscopy;;   PROSTATECTOMY      SOCIAL HISTORY: Social History   Socioeconomic History   Marital status: Married    Spouse name: Dawn   Number of children: 3   Years of education: Not on file   Highest  education level: GED or equivalent  Occupational History   Occupation: retired     Comment: 2007  Tobacco Use   Smoking status: Former    Current packs/day: 0.00    Average packs/day: 1 pack/day for 23.0 years (23.0 ttl pk-yrs)    Types: Cigarettes, Cigars    Start date: 06/13/1959    Quit date: 06/12/1982    Years since quitting: 41.2   Smokeless tobacco: Never   Tobacco comments:    I was a light smoker  Vaping Use   Vaping status: Never Used  Substance and Sexual Activity   Alcohol use: Never   Drug use: Never   Sexual activity: Not Currently    Comment: Prostate removal  Nov. 15 2006  Other Topics Concern   Not on file  Social History Narrative   Lives with wife and son. He has a basement with a wood stove.   Social Drivers of Corporate investment banker Strain: Low Risk  (03/03/2023)   Overall Financial Resource Strain (CARDIA)    Difficulty of Paying Living Expenses: Not hard at all  Food Insecurity: No Food Insecurity (03/03/2023)   Hunger Vital Sign    Worried About Running Out of Food in the Last Year: Never true    Ran Out of Food in the Last Year: Never true  Transportation Needs: No Transportation Needs (03/03/2023)   PRAPARE - Administrator, Civil Service (Medical): No    Lack of Transportation (Non-Medical): No  Physical Activity: Insufficiently Active (03/03/2023)   Exercise Vital Sign    Days of Exercise per Week: 1 day    Minutes of Exercise per Session: 10 min  Stress: No Stress Concern Present (03/03/2023)   [employer] of Occupational Health - Occupational Stress Questionnaire    Feeling of Stress : Not at all  Social Connections: Unknown (03/03/2023)   Social Connection and Isolation Panel [NHANES]    Frequency of Communication with Friends and Family: Once a week    Frequency of Social Gatherings with Friends and Family: Once a week    Attends Religious Services: 1 to 4 times per year    Active Member of [employer] or Organizations: Patient  declined    Attends Banker Meetings: Patient unable to answer    Marital Status: Married  Catering manager Violence: Not At Risk (02/05/2023)   Humiliation, Afraid, Rape, and Kick questionnaire    Fear of Current or Ex-Partner: No    Emotionally Abused: No    Physically Abused: No    Sexually Abused: No    FAMILY HISTORY: Family History  Problem Relation Age of Onset   Alzheimer's disease Mother    Alcohol abuse Father    Cancer Father        leukemia   Cirrhosis Father    Stomach cancer Father    Stroke Brother    Diabetes Daughter    Cancer Maternal Grandmother    Cancer Paternal Grandfather  Prostate cancer Brother    Brain cancer Brother    Thyroid cancer Sister     ALLERGIES:  has no known allergies.  MEDICATIONS:  Current Outpatient Medications  Medication Sig Dispense Refill   abiraterone acetate (ZYTIGA) 250 MG tablet Take 4 tablets (1,000 mg total) by mouth daily. Take on an empty stomach 1 hour before or 2 hours after a meal 360 tablet 2   apixaban (ELIQUIS) 5 MG TABS tablet Take 1 tablet (5 mg total) by mouth in the morning and at bedtime. 60 tablet 6   hydroxyurea (HYDREA) 500 MG capsule TAKE 1 CAPSULE BY MOUTH TWICE DAILY WITH FOOD TO MINIMIZE GI SIDE EFFECTS (Patient taking differently: Take 500 mg by mouth 2 (two) times daily with a meal. WITH FOOD TO MINIMIZE GI SIDE EFFECTS) 180 capsule 1   lisinopril-hydrochlorothiazide (ZESTORETIC) 20-12.5 MG tablet Take 2 tablets by mouth daily. (Patient taking differently: Take 1 tablet by mouth in the morning and at bedtime.) 180 tablet 1   metoprolol tartrate (LOPRESSOR) 25 MG tablet Take 1 tablet (25 mg total) by mouth 2 (two) times daily. **NEEDS TO BE SEEN BEFORE NEXT REFILL** 180 tablet 1   Multiple Vitamin (MULTIVITAMIN) capsule Take 1 capsule by mouth daily with breakfast.     PARoxetine (PAXIL) 10 MG tablet Take 1/2 (one-half) tablet by mouth twice daily (Patient taking differently: Take 5 mg by  mouth 2 (two) times daily.) 90 tablet 1   predniSONE (DELTASONE) 5 MG tablet Take 1 tablet (5 mg total) by mouth daily with breakfast. 90 tablet 1   No current facility-administered medications for this visit.    REVIEW OF SYSTEMS:   Constitutional: ( - ) fevers, ( - )  chills , ( - ) night sweats Eyes: ( - ) blurriness of vision, ( - ) double vision, ( - ) watery eyes Ears, nose, mouth, throat, and face: ( - ) mucositis, ( - ) sore throat Respiratory: ( - ) cough, ( - ) dyspnea, ( - ) wheezes Cardiovascular: ( - ) palpitation, ( - ) chest discomfort, ( - ) lower extremity swelling Gastrointestinal:  ( - ) nausea, ( - ) heartburn, ( - ) change in bowel habits Skin: ( - ) abnormal skin rashes Lymphatics: ( - ) new lymphadenopathy, ( - ) easy bruising Neurological: ( - ) numbness, ( - ) tingling, ( - ) new weaknesses Behavioral/Psych: ( - ) mood change, ( - ) new changes  All other systems were reviewed with the patient and are negative.  PHYSICAL EXAMINATION:  Vitals:   09/10/23 1011  BP: (!) 150/80  Pulse: 60  Resp: 18  Temp: (!) 97.1 F (36.2 C)  SpO2: 100%       Filed Weights   09/10/23 1011  Weight: 220 lb 14.4 oz (100.2 kg)       GENERAL: well appearing elderly Caucasian male alert, no distress and comfortable SKIN: skin color, texture, turgor are normal, no rashes or significant lesions EYES: conjunctiva are pink and non-injected, sclera clear LUNGS: clear to auscultation and percussion with normal breathing effort HEART: regular rate & rhythm and no murmurs. Trace bilateral lower extremity edema Musculoskeletal: no cyanosis of digits and no clubbing  PSYCH: alert & oriented x 3, fluent speech NEURO: no focal motor/sensory deficits  LABORATORY DATA:  I have reviewed the data as listed    Latest Ref Rng & Units 09/10/2023    9:50 AM 06/11/2023   10:35 AM 05/08/2023   11:24  AM  CBC  WBC 4.0 - 10.5 K/uL 7.7  6.6  7.9   Hemoglobin 13.0 - 17.0 g/dL 36.6   44.0  34.7   Hematocrit 39.0 - 52.0 % 40.4  38.1  39.1   Platelets 150 - 400 K/uL 299  280  384        Latest Ref Rng & Units 09/10/2023    9:50 AM 06/11/2023   10:35 AM 05/08/2023   11:24 AM  CMP  Glucose 70 - 99 mg/dL 425  956  387   BUN 8 - 23 mg/dL 28  31  25    Creatinine 0.61 - 1.24 mg/dL 5.64  3.32  9.51   Sodium 135 - 145 mmol/L 139  140  142   Potassium 3.5 - 5.1 mmol/L 3.9  4.4  4.1   Chloride 98 - 111 mmol/L 98  101  99   CO2 22 - 32 mmol/L 34  33  24   Calcium 8.9 - 10.3 mg/dL 9.4  9.6  9.8   Total Protein 6.5 - 8.1 g/dL 6.8  6.9    Total Bilirubin 0.0 - 1.2 mg/dL 0.8  0.9    Alkaline Phos 38 - 126 U/L 58  54    AST 15 - 41 U/L 14  16    ALT 0 - 44 U/L 10  12      No results found for: "MPROTEIN" No results found for: "KPAFRELGTCHN", "LAMBDASER", "KAPLAMBRATIO"  Pathology:    RADIOGRAPHIC STUDIES: No results found.  ASSESSMENT & PLAN Justin Gibbs 83 y.o. male with medical history significant for polycythemia vera who presents for a follow up visit.   After review of the labs, the records, and discussion with the patient the findings most consistent with a polycythemia vera which is JAK2 V617 F positive.  These results were confirmed with a bone marrow biopsy.  Given the patient's age greater than 30 he is considered a high risk polycythemia vera and therefore would require cytoreductive therapy with hydroxyurea 500 mg twice daily in addition to every 2 week phlebotomies.  Goal hematocrit for this patient is less than 45%.  Additionally he is on Eliquis therapy which will serve as anticoagulation for thromboprophylaxis.  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  Patient at goal HCT goal of <45% --Today Hgb 14.0, MCV 111, white blood cell 7.7, platelets 299 --patient currently at goal HCT, will hold  further phlebotomies unless needed.  --holding ASA 81mg  PO daily as the patient is on eliquis.  Eliquis was temporarily on hold due to blood in the urine.  Patient will be reaching out to his cardiologist to discuss restarting. -- Continue to monitor while being treated for prostate cancer as below.  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  --11/27/2021: widespread metastatic disease noted on PSMA scan.  --Underwent radiation therapy, completed this on 01/31/2021. 12/19/2021: first dose of leuprolide 22.5 mg IM -- Most recent NM PSMA scan Feb 2024 showed stable disease. Next imaging in August 2024 Plan: -- Currently received lupron injections q 3 months. Administered today, Next due today.  --no indication for zometa therapy at this time --continue zytiga 1000 mg PO daily with 5 mg PO prednisone PO daily.  --PSA and testosterone are castrate <3 at last check with PSA was 0.2 --RTC in 3 month  to re-evaluate.   No orders of the defined types were placed in this encounter.   All questions were answered. The patient knows to call the clinic with any problems, questions or concerns.  I have spent a total of 30 minutes minutes of face-to-face and non-face-to-face time, preparing to see the patient, performing a medically appropriate examination, counseling and educating the patient,documenting clinical information in the electronic health record,  and care coordination.   Ulysees Barns, MD Department of Hematology/Oncology [facility] at [facility] Phone: [phone] Pager: [phone] Email: [email or web address]  09/10/2023 2:17 PM

## 2023-09-10 ENCOUNTER — Inpatient Hospital Stay: Payer: Medicare Other | Attending: Hematology and Oncology

## 2023-09-10 ENCOUNTER — Encounter: Payer: Self-pay | Admitting: Hematology and Oncology

## 2023-09-10 ENCOUNTER — Inpatient Hospital Stay: Payer: Medicare Other

## 2023-09-10 ENCOUNTER — Inpatient Hospital Stay: Payer: Medicare Other | Admitting: Hematology and Oncology

## 2023-09-10 VITALS — BP 150/80 | HR 60 | Temp 97.1°F | Resp 18 | Wt 220.9 lb

## 2023-09-10 DIAGNOSIS — C7951 Secondary malignant neoplasm of bone: Secondary | ICD-10-CM | POA: Diagnosis not present

## 2023-09-10 DIAGNOSIS — I480 Paroxysmal atrial fibrillation: Secondary | ICD-10-CM | POA: Diagnosis not present

## 2023-09-10 DIAGNOSIS — C61 Malignant neoplasm of prostate: Secondary | ICD-10-CM | POA: Insufficient documentation

## 2023-09-10 DIAGNOSIS — Z5111 Encounter for antineoplastic chemotherapy: Secondary | ICD-10-CM | POA: Diagnosis not present

## 2023-09-10 DIAGNOSIS — D45 Polycythemia vera: Secondary | ICD-10-CM | POA: Insufficient documentation

## 2023-09-10 DIAGNOSIS — D75839 Thrombocytosis, unspecified: Secondary | ICD-10-CM | POA: Diagnosis not present

## 2023-09-10 LAB — CBC WITH DIFFERENTIAL (CANCER CENTER ONLY)
Abs Immature Granulocytes: 0.02 10*3/uL (ref 0.00–0.07)
Basophils Absolute: 0 10*3/uL (ref 0.0–0.1)
Basophils Relative: 1 %
Eosinophils Absolute: 0 10*3/uL (ref 0.0–0.5)
Eosinophils Relative: 1 %
HCT: 40.4 % (ref 39.0–52.0)
Hemoglobin: 14 g/dL (ref 13.0–17.0)
Immature Granulocytes: 0 %
Lymphocytes Relative: 8 %
Lymphs Abs: 0.6 10*3/uL — ABNORMAL LOW (ref 0.7–4.0)
MCH: 38.5 pg — ABNORMAL HIGH (ref 26.0–34.0)
MCHC: 34.7 g/dL (ref 30.0–36.0)
MCV: 111 fL — ABNORMAL HIGH (ref 80.0–100.0)
Monocytes Absolute: 0.4 10*3/uL (ref 0.1–1.0)
Monocytes Relative: 5 %
Neutro Abs: 6.6 10*3/uL (ref 1.7–7.7)
Neutrophils Relative %: 85 %
Platelet Count: 299 10*3/uL (ref 150–400)
RBC: 3.64 MIL/uL — ABNORMAL LOW (ref 4.22–5.81)
RDW: 13 % (ref 11.5–15.5)
WBC Count: 7.7 10*3/uL (ref 4.0–10.5)
nRBC: 0 % (ref 0.0–0.2)

## 2023-09-10 LAB — CMP (CANCER CENTER ONLY)
ALT: 10 U/L (ref 0–44)
AST: 14 U/L — ABNORMAL LOW (ref 15–41)
Albumin: 4 g/dL (ref 3.5–5.0)
Alkaline Phosphatase: 58 U/L (ref 38–126)
Anion gap: 7 (ref 5–15)
BUN: 28 mg/dL — ABNORMAL HIGH (ref 8–23)
CO2: 34 mmol/L — ABNORMAL HIGH (ref 22–32)
Calcium: 9.4 mg/dL (ref 8.9–10.3)
Chloride: 98 mmol/L (ref 98–111)
Creatinine: 1.14 mg/dL (ref 0.61–1.24)
GFR, Estimated: >60 mL/min (ref >60)
Glucose, Bld: 190 mg/dL — ABNORMAL HIGH (ref 70–99)
Potassium: 3.9 mmol/L (ref 3.5–5.1)
Sodium: 139 mmol/L (ref 135–145)
Total Bilirubin: 0.8 mg/dL (ref 0.0–1.2)
Total Protein: 6.8 g/dL (ref 6.5–8.1)

## 2023-09-10 MED ORDER — METOPROLOL TARTRATE 25 MG PO TABS: 25.0000 mg | ORAL_TABLET | Freq: Two times a day (BID) | ORAL | 1 refills | Status: DC

## 2023-09-10 MED ORDER — LEUPROLIDE ACETATE (3 MONTH) 22.5 MG ~~LOC~~ KIT
22.5000 mg | PACK | Freq: Once | SUBCUTANEOUS | Status: AC
  Administered 2023-09-10: 22.5 mg via SUBCUTANEOUS
  Filled 2023-09-10: qty 22.5

## 2023-09-11 LAB — PROSTATE-SPECIFIC AG, SERUM (LABCORP): Prostate Specific Ag, Serum: 0.1 ng/mL (ref 0.0–4.0)

## 2023-09-11 LAB — TESTOSTERONE: Testosterone: <3 ng/dL — ABNORMAL LOW (ref 264–916)

## 2023-09-17 ENCOUNTER — Other Ambulatory Visit: Payer: Self-pay | Admitting: Hematology and Oncology

## 2023-09-17 ENCOUNTER — Other Ambulatory Visit: Payer: Self-pay

## 2023-09-17 MED ORDER — PREDNISONE 5 MG PO TABS
5.0000 mg | ORAL_TABLET | Freq: Every day | ORAL | 1 refills | Status: DC
  Filled 2023-09-18 – 2023-09-24 (×2): qty 90, 90d supply, fill #0

## 2023-09-17 NOTE — Progress Notes (Signed)
 Specialty Pharmacy Refill Coordination Note  Justin Gibbs is a 83 y.o. male contacted today regarding refills of specialty medication(s) Abiraterone Acetate (ZYTIGA)   Patient requested Delivery   Delivery date: 09/25/23   Verified address: Patient address [address]  [place] [address]   Medication will be filled on 09/24/23.

## 2023-09-17 NOTE — Progress Notes (Signed)
 Specialty Pharmacy Ongoing Clinical Assessment Note  Justin Gibbs is a 83 y.o. male who is being followed by the specialty pharmacy service for RxSp Oncology   Patient's specialty medication(s) reviewed today: Abiraterone Acetate (ZYTIGA)   Missed doses in the last 4 weeks: 0   Patient/Caregiver did not have any additional questions or concerns.   Therapeutic benefit summary: Patient is achieving benefit   Adverse events/side effects summary: No adverse events/side effects   Patient's therapy is appropriate to: Continue    Goals Addressed             This Visit's Progress    Slow Disease Progression       Patient is on track. Patient will maintain adherence. PSA continued to decline, 0.1 on 09/10/23.          Follow up:  6 months  Otto Herb Specialty Pharmacist

## 2023-09-18 ENCOUNTER — Other Ambulatory Visit (HOSPITAL_COMMUNITY): Payer: Self-pay

## 2023-09-24 ENCOUNTER — Other Ambulatory Visit: Payer: Self-pay

## 2023-09-24 ENCOUNTER — Other Ambulatory Visit (HOSPITAL_COMMUNITY): Payer: Self-pay

## 2023-09-25 ENCOUNTER — Other Ambulatory Visit: Payer: Self-pay | Admitting: Nurse Practitioner

## 2023-09-25 DIAGNOSIS — I10 Essential (primary) hypertension: Secondary | ICD-10-CM

## 2023-10-10 ENCOUNTER — Other Ambulatory Visit: Payer: Self-pay

## 2023-10-13 ENCOUNTER — Other Ambulatory Visit: Payer: Self-pay

## 2023-10-13 ENCOUNTER — Other Ambulatory Visit: Payer: Self-pay | Admitting: Nurse Practitioner

## 2023-10-13 DIAGNOSIS — I10 Essential (primary) hypertension: Secondary | ICD-10-CM

## 2023-10-13 NOTE — Telephone Encounter (Signed)
 Last OV 01/17/2023. Last RF 01/17/2023 #180 with 1 refill. Next OV not scheduled

## 2023-10-13 NOTE — Progress Notes (Signed)
 Specialty Pharmacy Refill Coordination Note  Justin Gibbs is a 83 y.o. male contacted today regarding refills of specialty medication(s) Abiraterone Acetate (ZYTIGA)   Patient requested (Patient-Rptd) Delivery   Delivery date: 10/24/23   Verified address: (Patient-Rptd) [address]  [place] [address]   Medication will be filled on 03.27.25.

## 2023-10-22 ENCOUNTER — Ambulatory Visit: Payer: Medicare Other | Admitting: Cardiology

## 2023-10-27 ENCOUNTER — Other Ambulatory Visit: Payer: Self-pay

## 2023-10-27 ENCOUNTER — Other Ambulatory Visit (HOSPITAL_COMMUNITY): Payer: Self-pay

## 2023-10-28 ENCOUNTER — Telehealth: Payer: Self-pay | Admitting: Pharmacy Technician

## 2023-10-28 ENCOUNTER — Other Ambulatory Visit (HOSPITAL_COMMUNITY): Payer: Self-pay

## 2023-10-28 NOTE — Telephone Encounter (Signed)
 Oral Oncology Patient Advocate Encounter   Spoke with pt to advise him that he still has a second grant to cover his copay cost of his medication. He was thankful.

## 2023-10-29 ENCOUNTER — Other Ambulatory Visit: Payer: Self-pay | Admitting: Nurse Practitioner

## 2023-10-29 DIAGNOSIS — I10 Essential (primary) hypertension: Secondary | ICD-10-CM

## 2023-10-30 ENCOUNTER — Other Ambulatory Visit: Payer: Self-pay | Admitting: Nurse Practitioner

## 2023-10-30 ENCOUNTER — Telehealth: Admitting: Family Medicine

## 2023-10-30 DIAGNOSIS — I10 Essential (primary) hypertension: Secondary | ICD-10-CM

## 2023-10-30 DIAGNOSIS — R300 Dysuria: Secondary | ICD-10-CM

## 2023-10-30 MED ORDER — LISINOPRIL-HYDROCHLOROTHIAZIDE 20-12.5 MG PO TABS: 2.0000 | ORAL_TABLET | Freq: Every day | ORAL | 0 refills | Status: DC

## 2023-10-30 NOTE — Progress Notes (Signed)
 E-Visit for Urinary Problems ? ?Based on what you shared with me, I feel your condition warrants further evaluation and I recommend that you be seen for a face to face office visit.  Male bladder infections are not very common.  We worry about prostate or kidney conditions.  The standard of care is to examine the abdomen and kidneys, and to do a urine and blood test to make sure that something more serious is not going on.  We recommend that you see a provider today.  If your doctor's office is closed [place] has the following Urgent Cares: ? ?  ?NOTE: You will not be charged for this e-visit. ? ?If you are having a true medical emergency please call 911.   ? ?  ? For an urgent face to face visit, [place] has six urgent care centers for your convenience:  ?  ? [facility] ?Get Driving Directions ?[phone] ?[phone] ?[place], [place] [address] ?  ? [facility]) ?Get Driving Directions ?[phone] ?[address] ?[place], [place] [address] ? ?[facility]) ?Get Driving Directions ?[phone] ?[address] ?[place],  [place]  [address] ? ?[facility] ?Get Driving Directions ?[phone] ?[address] ?[place], [place] [address] ?  ?[facility] [place] ?Get Driving Directions  ?[phone] ?[address] ?[address] ?[place], [place] [address] ?  ? Urgent Care at [facility] ?Get Driving Directions ?[phone] ?[address] ?[place], [place] [address] ? ?Your MyChart E-visit questionnaire answers were reviewed by a board certified advanced clinical practitioner to complete your personal care plan based on your specific symptoms.  Thank you for using e-Visits. ?

## 2023-10-30 NOTE — Telephone Encounter (Signed)
 MMM pt NTBS 30-d given 09/25/23

## 2023-10-30 NOTE — Telephone Encounter (Signed)
 Copied from CRM [id number]. Topic: Clinical - Medication Refill >> Oct 30, 2023  9:36 AM Gery Pray wrote: Most Recent Primary Care Visit:   Medication: lisinopril-hydrochlorothiazide (ZESTORETIC) 20-12.5 MG tablet   Has the patient contacted their pharmacy? Yes (Agent: If no, request that the patient contact the pharmacy for the refill. If patient does not wish to contact the pharmacy document the reason why and proceed with request.) (Agent: If yes, when and what did the pharmacy advise?) States to call provider  Is this the correct pharmacy for this prescription? Yes If no, delete pharmacy and type the correct one.  This is the patient's preferred pharmacy:  [facility] [address], [place] - [address] [place] [address] [place] [place] [address] Phone: [phone] Fax: [phone]   Has the prescription been filled recently? No  Is the patient out of the medication? Yes  Has the patient been seen for an appointment in the last year OR does the patient have an upcoming appointment? No  Can we respond through MyChart? No  Agent: Please be advised that Rx refills may take up to 3 business days. We ask that you follow-up with your pharmacy.

## 2023-10-30 NOTE — Telephone Encounter (Signed)
 Appt scheduled for 10/31/2023

## 2023-10-31 ENCOUNTER — Encounter: Payer: Self-pay | Admitting: Nurse Practitioner

## 2023-10-31 ENCOUNTER — Ambulatory Visit: Admitting: Nurse Practitioner

## 2023-10-31 VITALS — BP 126/61 | HR 77 | Temp 98.2°F | Ht 70.0 in | Wt 223.0 lb

## 2023-10-31 DIAGNOSIS — Z6832 Body mass index (BMI) 32.0-32.9, adult: Secondary | ICD-10-CM | POA: Diagnosis not present

## 2023-10-31 DIAGNOSIS — F411 Generalized anxiety disorder: Secondary | ICD-10-CM | POA: Diagnosis not present

## 2023-10-31 DIAGNOSIS — C61 Malignant neoplasm of prostate: Secondary | ICD-10-CM

## 2023-10-31 DIAGNOSIS — I480 Paroxysmal atrial fibrillation: Secondary | ICD-10-CM | POA: Diagnosis not present

## 2023-10-31 DIAGNOSIS — I10 Essential (primary) hypertension: Secondary | ICD-10-CM

## 2023-10-31 DIAGNOSIS — E782 Mixed hyperlipidemia: Secondary | ICD-10-CM | POA: Diagnosis not present

## 2023-10-31 MED ORDER — PAROXETINE HCL 10 MG PO TABS: ORAL_TABLET | ORAL | 1 refills | Status: DC

## 2023-10-31 MED ORDER — APIXABAN 5 MG PO TABS: 5.0000 mg | ORAL_TABLET | Freq: Two times a day (BID) | ORAL | 6 refills | Status: DC

## 2023-10-31 MED ORDER — HYDROXYUREA 500 MG PO CAPS
ORAL_CAPSULE | ORAL | 0 refills | Status: DC
Start: 2023-10-31 — End: 2024-04-05

## 2023-10-31 MED ORDER — METOPROLOL TARTRATE 25 MG PO TABS
25.0000 mg | ORAL_TABLET | Freq: Two times a day (BID) | ORAL | 1 refills | Status: DC
Start: 2023-10-31 — End: 2024-04-16

## 2023-10-31 NOTE — Addendum Note (Signed)
 Addended by: Bennie Pierini on: 10/31/2023 09:31 AM   Modules accepted: Orders

## 2023-10-31 NOTE — Progress Notes (Signed)
 Subjective:    Patient ID: Justin Gibbs, male    DOB: June 01, 1941, 83 y.o.   MRN: [id number]  Chief Complaint: medical management of chronic issues     HPI:  1. Essential hypertension No c/o chest pain, sob or headaches. He occasionally checks his blood pressure at home. Usually 130/80's BP Readings from Last 3 Encounters:  10/31/23 126/61  09/10/23 (!) 150/80  06/18/23 130/88      2. Paroxysmal atrial fibrillation (HCC) Is on eliquis with no issues. Denies palpitations or heart racing  3. Mixed hyperlipidemia Does not watch diet and does no exercise  4. GAD (generalized anxiety disorder) Has been on paxil for several years. Is doing well.    10/31/2023    9:14 AM 05/08/2023   10:55 AM 03/04/2023    9:20 AM 01/17/2023    9:41 AM  GAD 7 : Generalized Anxiety Score  Nervous, Anxious, on Edge 0 0 0 0  Control/stop worrying 0 0 0 0  Worry too much - different things 0 0 0 0  Trouble relaxing 0 1 0 0  Restless 0 0 0 0  Easily annoyed or irritable 0 0 0 0  Afraid - awful might happen 0 0 0 0  Total GAD 7 Score 0 1 0 0  Anxiety Difficulty Not difficult at all Not difficult at all Not difficult at all Not difficult at all     5. Prostate cancer [facility]) Just had  radiation treatment. Is voiding well. Just has to void often Lab Results  Component Value Date   PSA1 0.1 09/10/2023   PSA1 0.2 06/11/2023   PSA1 0.2 03/19/2023      6. 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 Take on an empty stomach 1 hour before or 2 hours after a meal   apixaban (ELIQUIS) 5 MG TABS  tablet Take 1 tablet (5 mg total) by mouth in the morning and at bedtime.   hydroxyurea (HYDREA) 500 MG capsule TAKE 1 CAPSULE BY MOUTH TWICE DAILY WITH FOOD TO  MINIMIZE  GI  SIDE  EFFECTS   lisinopril-hydrochlorothiazide (ZESTORETIC) 20-12.5 MG tablet Take 2 tablets by mouth daily.   metoprolol tartrate (LOPRESSOR) 25 MG tablet Take 1 tablet (25 mg total) by mouth 2 (two) times daily. **NEEDS TO BE SEEN BEFORE NEXT REFILL**   Multiple Vitamin (MULTIVITAMIN) capsule Take 1 capsule by mouth daily with breakfast.   PARoxetine (PAXIL) 10 MG tablet Take 1/2 (one-half) tablet by mouth twice daily (Patient taking differently: Take 5 mg by mouth 2 (two) times daily.)   predniSONE (DELTASONE) 5 MG tablet Take 1 tablet (5 mg total) by mouth daily with breakfast.   No facility-administered encounter medications on file as of 10/31/2023.    Past Surgical History:  Procedure Laterality Date   BIOPSY  08/27/2021   Procedure: BIOPSY;  Surgeon: Lanelle Bal, DO;  [facility]: [facility];  Service: Endoscopy;;   COLONOSCOPY WITH PROPOFOL N/A 08/27/2021   Procedure: COLONOSCOPY WITH PROPOFOL;  Surgeon: Lanelle Bal, DO;  Location: [facility];  Service: Endoscopy;  Laterality: N/A;  8:00am   HERNIA REPAIR Bilateral    x 2    POLYPECTOMY  08/27/2021   Procedure: POLYPECTOMY;  Surgeon: Lanelle Bal, DO;  Location: [facility];  Service: Endoscopy;;   PROSTATECTOMY      Family History  Problem Relation Age of Onset   Alzheimer's disease Mother    Alcohol abuse Father    Cancer Father        leukemia   Cirrhosis Father    Stomach cancer Father    Stroke Brother    Diabetes Daughter    Cancer Maternal Grandmother    Cancer Paternal Grandfather    Prostate cancer Brother    Brain cancer Brother    Thyroid cancer Sister     New complaints: None today  Social history: Lives with his wife  Controlled substance contract: n/a     Review of Systems  Constitutional:  Negative  for diaphoresis.  Eyes:  Negative for pain.  Respiratory:  Negative for shortness of breath.   Cardiovascular:  Negative for chest pain, palpitations and leg swelling.  Gastrointestinal:  Negative for abdominal pain.  Endocrine: Negative for polydipsia.  Skin:  Negative for rash.  Neurological:  Negative for dizziness, weakness and headaches.  Hematological:  Does not bruise/bleed easily.  All other systems reviewed and are negative.      Objective:   Physical Exam Vitals and nursing note reviewed.  Constitutional:      Appearance: Normal appearance. He is well-developed.  HENT:     Head: Normocephalic.     Nose: Nose normal.  Eyes:     Pupils: Pupils are equal, round, and reactive to light.  Neck:     Thyroid: No thyroid mass or thyromegaly.     Vascular: No carotid bruit or JVD.     Trachea: Phonation normal.  Cardiovascular:     Rate and Rhythm: Normal rate and regular rhythm.  Pulmonary:     Effort: Pulmonary effort is normal. No respiratory distress.     Breath sounds: Normal breath sounds.  Abdominal:     General: Bowel sounds are normal.     Palpations: Abdomen is soft.     Tenderness: There is no abdominal tenderness.  Musculoskeletal:        General: Normal range of motion.     Cervical back: Normal range of motion and neck supple.  Lymphadenopathy:     Cervical: No cervical adenopathy.  Skin:    General: Skin is warm and dry.     Comments: Iip excison area is doing well- no signs of infection.  Neurological:     Mental Status: He is alert and oriented to person, place, and time.  Psychiatric:        Behavior: Behavior normal.        Thought Content: Thought content normal.        Judgment: Judgment normal.     BP (!) 145/88   Pulse 77   Temp 98.2 F (36.8 C) (Temporal)   Ht 5\' 10"  (1.778 m)   Wt 223 lb (101.2 kg)   BMI 32.00 kg/m          Assessment & Plan:  Justin Gibbs comes in today with chief complaint of     Diagnosis and  orders addressed:  1. Essential hypertension Keep diary of blood pressure at home - hydroxyurea (HYDREA) 500 MG capsule; Take 1 capsule (500 mg total) by  mouth 2 (two) times daily. May take with food to minimize GI side effects.  Dispense: 180 capsule; Refill: 1 - lisinopril-hydrochlorothiazide (ZESTORETIC) 20-12.5 MG tablet; Take 2 tablets by mouth daily.  Dispense: 90 tablet; Refill: 1 - CBC with Differential/Platelet - CMP14+EGFR  2. Paroxysmal atrial fibrillation (HCC) Avoid caffeine - metoprolol tartrate (LOPRESSOR) 25 MG tablet; Take 1 tablet (25 mg total) by mouth 2 (two) times daily.  Dispense: 90 tablet; Refill: 1 - apixaban (ELIQUIS) 5 MG TABS tablet; Take 1 tablet (5 mg total) by mouth 2 (two) times daily.  Dispense: 60 tablet; Refill: 11  3. Mixed hyperlipidemia Low fat diet - Lipid panel  4. GAD (generalized anxiety disorder) Stress management - PARoxetine (PAXIL) 10 MG tablet; Take 0.5 tablets (5 mg total) by mouth 2 (two) times daily.  Dispense: 90 tablet; Refill: 1  5. Prostate cancer [facility]) Keep follow up with urology and oncology  6. BMI 32.0-32.9,adult Discussed diet and exercise for person with BMI >25 Will recheck weight in 3-6 months   Labs pending Health Maintenance reviewed Diet and exercise encouraged  Follow up plan: 6 months   Mary-Margaret Daphine Deutscher, FNP

## 2023-10-31 NOTE — Patient Instructions (Signed)
 Fall Prevention in the Home, Adult Falls can cause injuries and can happen to people of all ages. There are many things you can do to make your home safer and to help prevent falls. What actions can I take to prevent falls? General information Use good lighting in all rooms. Make sure to: Replace any light bulbs that burn out. Turn on the lights in dark areas and use night-lights. Keep items that you use often in easy-to-reach places. Lower the shelves around your home if needed. Move furniture so that there are clear paths around it. Do not use throw rugs or other things on the floor that can make you trip. If any of your floors are uneven, fix them. Add color or contrast paint or tape to clearly mark and help you see: Grab bars or handrails. First and last steps of staircases. Where the edge of each step is. If you use a ladder or stepladder: Make sure that it is fully opened. Do not climb a closed ladder. Make sure the sides of the ladder are locked in place. Have someone hold the ladder while you use it. Know where your pets are as you move through your home. What can I do in the bathroom?     Keep the floor dry. Clean up any water on the floor right away. Remove soap buildup in the bathtub or shower. Buildup makes bathtubs and showers slippery. Use non-skid mats or decals on the floor of the bathtub or shower. Attach bath mats securely with double-sided, non-slip rug tape. If you need to sit down in the shower, use a non-slip stool. Install grab bars by the toilet and in the bathtub and shower. Do not use towel bars as grab bars. What can I do in the bedroom? Make sure that you have a light by your bed that is easy to reach. Do not use any sheets or blankets on your bed that hang to the floor. Have a firm chair or bench with side arms that you can use for support when you get dressed. What can I do in the kitchen? Clean up any spills right away. If you need to reach something  above you, use a step stool with a grab bar. Keep electrical cords out of the way. Do not use floor polish or wax that makes floors slippery. What can I do with my stairs? Do not leave anything on the stairs. Make sure that you have a light switch at the top and the bottom of the stairs. Make sure that there are handrails on both sides of the stairs. Fix handrails that are broken or loose. Install non-slip stair treads on all your stairs if they do not have carpet. Avoid having throw rugs at the top or bottom of the stairs. Choose a carpet that does not hide the edge of the steps on the stairs. Make sure that the carpet is firmly attached to the stairs. Fix carpet that is loose or worn. What can I do on the outside of my home? Use bright outdoor lighting. Fix the edges of walkways and driveways and fix any cracks. Clear paths of anything that can make you trip, such as tools or rocks. Add color or contrast paint or tape to clearly mark and help you see anything that might make you trip as you walk through a door, such as a raised step or threshold. Trim any bushes or trees on paths to your home. Check to see if handrails are loose  or broken and that both sides of all steps have handrails. Install guardrails along the edges of any raised decks and porches. Have leaves, snow, or ice cleared regularly. Use sand, salt, or ice melter on paths if you live where there is ice and snow during the winter. Clean up any spills in your garage right away. This includes grease or oil spills. What other actions can I take? Review your medicines with your doctor. Some medicines can cause dizziness or changes in blood pressure, which increase your risk of falling. Wear shoes that: Have a low heel. Do not wear high heels. Have rubber bottoms and are closed at the toe. Feel good on your feet and fit well. Use tools that help you move around if needed. These include: Canes. Walkers. Scooters. Crutches. Ask  your doctor what else you can do to help prevent falls. This may include seeing a physical therapist to learn to do exercises to move better and get stronger. Where to find more information Centers for Disease Control and Prevention, [employer]AD[employer]: TonerPromos.no [employer] on Aging: BaseRingTones.pl National Institute on Aging: BaseRingTones.pl Contact a doctor if: You are afraid of falling at home. You feel weak, drowsy, or dizzy at home. You fall at home. Get help right away if you: Lose consciousness or have trouble moving after a fall. Have a fall that causes a head injury. These symptoms may be an emergency. Get help right away. Call 911. Do not wait to see if the symptoms will go away. Do not drive yourself to the hospital. This information is not intended to replace advice given to you by your health care provider. Make sure you discuss any questions you have with your health care provider. Document Revised: 03/18/2022 Document Reviewed: 03/18/2022 Elsevier Patient Education  2024 [employer].

## 2023-11-03 ENCOUNTER — Telehealth: Payer: Self-pay | Admitting: Family Medicine

## 2023-11-03 NOTE — Telephone Encounter (Unsigned)
 Copied from CRM [id number]. Topic: Clinical - Prescription Issue >> Nov 03, 2023  3:18 PM Fuller Mandril wrote: Reason for CRM: Patient called,, request to speak to [facility]. States the last two times he had lisinopril-hydrochlorothiazide (ZESTORETIC) 20-12.5 MG tablet filled it has no refills and he would like to know why. Thank You

## 2023-11-04 ENCOUNTER — Other Ambulatory Visit: Payer: Self-pay | Admitting: Nurse Practitioner

## 2023-11-04 DIAGNOSIS — I10 Essential (primary) hypertension: Secondary | ICD-10-CM

## 2023-11-04 MED ORDER — LISINOPRIL-HYDROCHLOROTHIAZIDE 20-12.5 MG PO TABS
2.0000 | ORAL_TABLET | Freq: Every day | ORAL | 1 refills | Status: DC
Start: 2023-11-04 — End: 2024-04-16

## 2023-11-06 ENCOUNTER — Telehealth: Payer: Self-pay | Admitting: Nurse Practitioner

## 2023-11-06 NOTE — Telephone Encounter (Signed)
 Done

## 2023-11-06 NOTE — Telephone Encounter (Signed)
 Please advise.   Copied from CRM [id number]. Topic: Appointments - Transfer of Care >> Nov 06, 2023 10:14 AM Fuller Mandril wrote: Pt is requesting to transfer FROM: Justin Gibbs  Pt is requesting to transfer TO: Justin Gibbs Reason for requested transfer: Would like to go back to [place]. Did not want to schedule and appt at this time. Will call back to schedule when its time for refill.  It is the responsibility of the team the patient would like to transfer to [facility]) to reach out to the patient if for any reason this transfer is not acceptable.

## 2023-11-17 ENCOUNTER — Other Ambulatory Visit: Payer: Self-pay

## 2023-11-17 ENCOUNTER — Other Ambulatory Visit: Payer: Self-pay | Admitting: Hematology and Oncology

## 2023-11-17 ENCOUNTER — Other Ambulatory Visit: Payer: Self-pay | Admitting: Pharmacy Technician

## 2023-11-17 MED ORDER — PREDNISONE 5 MG PO TABS
5.0000 mg | ORAL_TABLET | Freq: Every day | ORAL | 1 refills | Status: DC
  Filled 2023-11-17 – 2023-12-17 (×2): qty 90, 90d supply, fill #0
  Filled 2024-03-09: qty 90, 90d supply, fill #1

## 2023-11-17 NOTE — Progress Notes (Signed)
 Specialty Pharmacy Refill Coordination Note  Justin Gibbs is a 83 y.o. male contacted today regarding refills of specialty medication(s) No data recorded  Patient requested (Patient-Rptd) Delivery   Delivery date: (Patient-Rptd) 11/27/23   Verified address: (Patient-Rptd) [address]  [place] [address]   Medication will be filled on 11/26/23.

## 2023-11-26 ENCOUNTER — Other Ambulatory Visit: Payer: Self-pay

## 2023-12-09 ENCOUNTER — Other Ambulatory Visit: Payer: Self-pay | Admitting: Physician Assistant

## 2023-12-09 DIAGNOSIS — C61 Malignant neoplasm of prostate: Secondary | ICD-10-CM

## 2023-12-09 DIAGNOSIS — D45 Polycythemia vera: Secondary | ICD-10-CM

## 2023-12-10 ENCOUNTER — Inpatient Hospital Stay: Payer: Medicare Other

## 2023-12-10 ENCOUNTER — Inpatient Hospital Stay: Payer: Medicare Other | Attending: Hematology and Oncology

## 2023-12-10 ENCOUNTER — Inpatient Hospital Stay: Payer: Medicare Other | Admitting: Physician Assistant

## 2023-12-10 VITALS — BP 155/83 | HR 69 | Temp 98.2°F | Resp 13 | Wt 223.2 lb

## 2023-12-10 DIAGNOSIS — D45 Polycythemia vera: Secondary | ICD-10-CM

## 2023-12-10 DIAGNOSIS — C61 Malignant neoplasm of prostate: Secondary | ICD-10-CM

## 2023-12-10 DIAGNOSIS — Z8042 Family history of malignant neoplasm of prostate: Secondary | ICD-10-CM | POA: Insufficient documentation

## 2023-12-10 DIAGNOSIS — Z8379 Family history of other diseases of the digestive system: Secondary | ICD-10-CM | POA: Insufficient documentation

## 2023-12-10 DIAGNOSIS — I10 Essential (primary) hypertension: Secondary | ICD-10-CM | POA: Insufficient documentation

## 2023-12-10 DIAGNOSIS — F419 Anxiety disorder, unspecified: Secondary | ICD-10-CM | POA: Insufficient documentation

## 2023-12-10 DIAGNOSIS — Z923 Personal history of irradiation: Secondary | ICD-10-CM | POA: Insufficient documentation

## 2023-12-10 DIAGNOSIS — Z833 Family history of diabetes mellitus: Secondary | ICD-10-CM | POA: Diagnosis not present

## 2023-12-10 DIAGNOSIS — I4891 Unspecified atrial fibrillation: Secondary | ICD-10-CM | POA: Diagnosis not present

## 2023-12-10 DIAGNOSIS — F17290 Nicotine dependence, other tobacco product, uncomplicated: Secondary | ICD-10-CM | POA: Diagnosis not present

## 2023-12-10 DIAGNOSIS — Z7982 Long term (current) use of aspirin: Secondary | ICD-10-CM | POA: Insufficient documentation

## 2023-12-10 DIAGNOSIS — Z808 Family history of malignant neoplasm of other organs or systems: Secondary | ICD-10-CM | POA: Insufficient documentation

## 2023-12-10 DIAGNOSIS — Z818 Family history of other mental and behavioral disorders: Secondary | ICD-10-CM | POA: Insufficient documentation

## 2023-12-10 DIAGNOSIS — Z7901 Long term (current) use of anticoagulants: Secondary | ICD-10-CM | POA: Diagnosis not present

## 2023-12-10 DIAGNOSIS — Z806 Family history of leukemia: Secondary | ICD-10-CM | POA: Insufficient documentation

## 2023-12-10 DIAGNOSIS — C7951 Secondary malignant neoplasm of bone: Secondary | ICD-10-CM | POA: Diagnosis not present

## 2023-12-10 DIAGNOSIS — D72829 Elevated white blood cell count, unspecified: Secondary | ICD-10-CM | POA: Insufficient documentation

## 2023-12-10 DIAGNOSIS — Z823 Family history of stroke: Secondary | ICD-10-CM | POA: Diagnosis not present

## 2023-12-10 DIAGNOSIS — Z800 Family history of malignant neoplasm of digestive organs: Secondary | ICD-10-CM | POA: Diagnosis not present

## 2023-12-10 DIAGNOSIS — Z5111 Encounter for antineoplastic chemotherapy: Secondary | ICD-10-CM | POA: Diagnosis not present

## 2023-12-10 DIAGNOSIS — Z811 Family history of alcohol abuse and dependence: Secondary | ICD-10-CM | POA: Diagnosis not present

## 2023-12-10 DIAGNOSIS — D75839 Thrombocytosis, unspecified: Secondary | ICD-10-CM | POA: Diagnosis not present

## 2023-12-10 DIAGNOSIS — Z9079 Acquired absence of other genital organ(s): Secondary | ICD-10-CM | POA: Diagnosis not present

## 2023-12-10 DIAGNOSIS — Z7964 Long term (current) use of myelosuppressive agent: Secondary | ICD-10-CM | POA: Diagnosis not present

## 2023-12-10 DIAGNOSIS — Z79899 Other long term (current) drug therapy: Secondary | ICD-10-CM | POA: Insufficient documentation

## 2023-12-10 LAB — CBC WITH DIFFERENTIAL (CANCER CENTER ONLY)
Abs Immature Granulocytes: 0.04 10*3/uL (ref 0.00–0.07)
Basophils Absolute: 0.1 10*3/uL (ref 0.0–0.1)
Basophils Relative: 1 %
Eosinophils Absolute: 0.1 10*3/uL (ref 0.0–0.5)
Eosinophils Relative: 1 %
HCT: 40 % (ref 39.0–52.0)
Hemoglobin: 14.1 g/dL (ref 13.0–17.0)
Immature Granulocytes: 1 %
Lymphocytes Relative: 9 %
Lymphs Abs: 0.8 10*3/uL (ref 0.7–4.0)
MCH: 37.6 pg — ABNORMAL HIGH (ref 26.0–34.0)
MCHC: 35.3 g/dL (ref 30.0–36.0)
MCV: 106.7 fL — ABNORMAL HIGH (ref 80.0–100.0)
Monocytes Absolute: 0.4 10*3/uL (ref 0.1–1.0)
Monocytes Relative: 5 %
Neutro Abs: 7.2 10*3/uL (ref 1.7–7.7)
Neutrophils Relative %: 83 %
Platelet Count: 310 10*3/uL (ref 150–400)
RBC: 3.75 MIL/uL — ABNORMAL LOW (ref 4.22–5.81)
RDW: 12.8 % (ref 11.5–15.5)
WBC Count: 8.5 10*3/uL (ref 4.0–10.5)
nRBC: 0 % (ref 0.0–0.2)

## 2023-12-10 LAB — CMP (CANCER CENTER ONLY)
ALT: 11 U/L (ref 0–44)
AST: 16 U/L (ref 15–41)
Albumin: 4.3 g/dL (ref 3.5–5.0)
Alkaline Phosphatase: 52 U/L (ref 38–126)
Anion gap: 8 (ref 5–15)
BUN: 27 mg/dL — ABNORMAL HIGH (ref 8–23)
CO2: 33 mmol/L — ABNORMAL HIGH (ref 22–32)
Calcium: 9.5 mg/dL (ref 8.9–10.3)
Chloride: 99 mmol/L (ref 98–111)
Creatinine: 1.07 mg/dL (ref 0.61–1.24)
GFR, Estimated: >60 mL/min (ref >60)
Glucose, Bld: 138 mg/dL — ABNORMAL HIGH (ref 70–99)
Potassium: 3.5 mmol/L (ref 3.5–5.1)
Sodium: 140 mmol/L (ref 135–145)
Total Bilirubin: 0.8 mg/dL (ref 0.0–1.2)
Total Protein: 7.2 g/dL (ref 6.5–8.1)

## 2023-12-10 MED ORDER — LEUPROLIDE ACETATE (3 MONTH) 22.5 MG ~~LOC~~ KIT
22.5000 mg | PACK | Freq: Once | SUBCUTANEOUS | Status: AC
Start: 2023-12-10 — End: 2023-12-10
  Administered 2023-12-10: 22.5 mg via SUBCUTANEOUS
  Filled 2023-12-10: qty 22.5

## 2023-12-10 NOTE — Progress Notes (Signed)
 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 Rosaline Coma  10/11/2020: started phebotomy q 2 weeks and hydroxyurea  500mg  BID 11/15/2020: WBC 9.0, Hgb 14.9, HCT 44.5%, Plt 384. Holding phlebotomies, patient at target.   #Hx of Prostate Cancer 2006: robotic resection of prostate.  11/16/2019: PSA 0.21([facility])  08/08/2020: PSA 0.36 [facility])  11/28/2021: NM PSMA scan showed widespread skeletal metastatic disease involving calvarium, ribs, spine, pelvis and bilateral proximal femora 12/19/2021: first dose of leuprolide  22.5 mg IM 01/23/2022: added abiraterone  1000 mg with prednisone .  03/07/2022: PSMA scan showed similar to minimal improvement in osseous metastasis  Interval History:  Justin Gibbs 83 y.o. male with medical history significant for polycythemia vera and prostate cancer who presents for a follow up visit. The patient's last visit was on 09/10/2023. In the interim since the last visit the patient has had no major changes in his health.  On exam today Mr. Enfield reports he has been well overall interim since his last visit.  He reports his energy and appetite are stable. He denies nausea, vomiting or bowel habit changes. He denies easy bruising or signs of bleeding. He denies any new bone  or back pain. He is otherwise feeling well without fevers, chills, sweats, shortness of breath, chest pain or cough.   A full 10 point ROS is listed below.  MEDICAL HISTORY:  Past Medical History:  Diagnosis Date   Anxiety    Arthritis    Atrial fibrillation (HCC) 06/2020   Cancer (HCC)    prostate / removed 2006   Depression    Dysrhythmia    High triglycerides    Hypertension    Polycythemia vera, acquired (HCC)     SURGICAL HISTORY: Past Surgical History:  Procedure Laterality Date   BIOPSY  08/27/2021   Procedure: BIOPSY;  Surgeon: Vinetta Greening, DO;  [facility];  Service: Endoscopy;;   COLONOSCOPY WITH PROPOFOL  N/A 08/27/2021   Procedure: COLONOSCOPY WITH PROPOFOL ;  Surgeon: Vinetta Greening, DO;  [facility];  Service: Endoscopy;  Laterality: N/A;  8:00am   HERNIA REPAIR Bilateral    x 2    POLYPECTOMY  08/27/2021   Procedure: POLYPECTOMY;  Surgeon: Vinetta Greening, DO;  [facility];  Service: Endoscopy;;   PROSTATECTOMY      SOCIAL HISTORY: Social History   Socioeconomic History   Marital status: Married    Spouse name: Dawn   Number of children: 3   Years of education: Not on file   Highest education level: GED or equivalent  Occupational History   Occupation: retired     Comment: 2007  Tobacco Use   Smoking status: Former    Current packs/day: 0.00    Average packs/day: 1 pack/day for 23.0 years (23.0 ttl pk-yrs)    Types: Cigarettes, Cigars    Start date: 06/13/1959    Quit date:  06/12/1982    Years since quitting: 41.5   Smokeless tobacco: Never   Tobacco comments:    I was a light smoker  Vaping Use   Vaping status: Never Used  Substance and Sexual Activity   Alcohol use: Never   Drug use: Never   Sexual activity: Not Currently    Comment: Prostate removal  Nov. 15 2006  Other Topics Concern   Not on file  Social History Narrative   Lives with wife and son. He has a basement with a wood stove.    Social Drivers of Corporate investment banker Strain: Low Risk  (10/30/2023)   Overall Financial Resource Strain (CARDIA)    Difficulty of Paying Living Expenses: Not hard at all  Food Insecurity: No Food Insecurity (10/30/2023)   Hunger Vital Sign    Worried About Running Out of Food in the Last Year: Never true    Ran Out of Food in the Last Year: Never true  Transportation Needs: No Transportation Needs (10/30/2023)   PRAPARE - Administrator, Civil Service (Medical): No    Lack of Transportation (Non-Medical): No  Physical Activity: Insufficiently Active (10/30/2023)   Exercise Vital Sign    Days of Exercise per Week: 2 days    Minutes of Exercise per Session: 10 min  Stress: No Stress Concern Present (10/30/2023)   [employer] of Occupational Health - Occupational Stress Questionnaire    Feeling of Stress : Not at all  Social Connections: Moderately Isolated (10/30/2023)   Social Connection and Isolation Panel [NHANES]    Frequency of Communication with Friends and Family: Three times a week    Frequency of Social Gatherings with Friends and Family: Once a week    Attends Religious Services: Never    Database administrator or Organizations: No    Attends Banker Meetings: Patient unable to answer    Marital Status: Married  Catering manager Violence: Not At Risk (02/05/2023)   Humiliation, Afraid, Rape, and Kick questionnaire    Fear of Current or Ex-Partner: No    Emotionally Abused: No    Physically Abused: No    Sexually Abused: No    FAMILY HISTORY: Family History  Problem Relation Age of Onset   Alzheimer's disease Mother    Alcohol abuse Father    Cancer Father        leukemia   Cirrhosis Father    Stomach cancer Father    Stroke Brother    Diabetes Daughter    Cancer Maternal Grandmother    Cancer Paternal Grandfather    Prostate cancer Brother    Brain cancer Brother    Thyroid  cancer Sister     ALLERGIES:  has no known  allergies.  MEDICATIONS:  Current Outpatient Medications  Medication Sig Dispense Refill   abiraterone  acetate (ZYTIGA ) 250 MG tablet Take 4 tablets (1,000 mg total) by mouth daily. Take on an empty stomach 1 hour before or 2 hours after a meal 360 tablet 2   apixaban  (ELIQUIS ) 5 MG TABS tablet Take 1 tablet (5 mg total) by mouth in the morning and at bedtime. 60 tablet 6   hydroxyurea  (HYDREA ) 500 MG capsule TAKE 1 CAPSULE BY MOUTH TWICE DAILY WITH FOOD TO MINIMIZE GI SIDE EFFECTS 180 capsule 0   lisinopril -hydrochlorothiazide  (ZESTORETIC ) 20-12.5 MG tablet Take 2 tablets by mouth daily. 180 tablet 1   metoprolol  tartrate (LOPRESSOR ) 25 MG tablet Take 1 tablet (25 mg total) by mouth 2 (  two) times daily. **NEEDS TO BE SEEN BEFORE NEXT REFILL** 180 tablet 1   Multiple Vitamin (MULTIVITAMIN) capsule Take 1 capsule by mouth daily with breakfast.     PARoxetine  (PAXIL ) 10 MG tablet Take 1/2 (one-half) tablet by mouth twice daily 90 tablet 1   predniSONE  (DELTASONE ) 5 MG tablet Take 1 tablet (5 mg total) by mouth daily with breakfast. 90 tablet 1   No current facility-administered medications for this visit.    REVIEW OF SYSTEMS:   Constitutional: ( - ) fevers, ( - )  chills , ( - ) night sweats Eyes: ( - ) blurriness of vision, ( - ) double vision, ( - ) watery eyes Ears, nose, mouth, throat, and face: ( - ) mucositis, ( - ) sore throat Respiratory: ( - ) cough, ( - ) dyspnea, ( - ) wheezes Cardiovascular: ( - ) palpitation, ( - ) chest discomfort, ( - ) lower extremity swelling Gastrointestinal:  ( - ) nausea, ( - ) heartburn, ( - ) change in bowel habits Skin: ( - ) abnormal skin rashes Lymphatics: ( - ) new lymphadenopathy, ( - ) easy bruising Neurological: ( - ) numbness, ( - ) tingling, ( - ) new weaknesses Behavioral/Psych: ( - ) mood change, ( - ) new changes  All other systems were reviewed with the patient and are negative.  PHYSICAL EXAMINATION:  Vitals:   12/10/23 1139  BP:  (!) 155/83  Pulse: 69  Resp: 13  Temp: 98.2 F (36.8 C)  SpO2: 98%       Filed Weights   12/10/23 1139  Weight: 223 lb 3.2 oz (101.2 kg)    GENERAL: well appearing elderly Caucasian male alert, no distress and comfortable SKIN: skin color, texture, turgor are normal, no rashes or significant lesions EYES: conjunctiva are pink and non-injected, sclera clear LUNGS: clear to auscultation and percussion with normal breathing effort HEART: regular rate & rhythm and no murmurs.  Musculoskeletal: no cyanosis of digits and no clubbing  PSYCH: alert & oriented x 3, fluent speech NEURO: no focal motor/sensory deficits  LABORATORY DATA:  I have reviewed the data as listed    Latest Ref Rng & Units 12/10/2023   10:16 AM 09/10/2023    9:50 AM 06/11/2023   10:35 AM  CBC  WBC 4.0 - 10.5 K/uL 8.5  7.7  6.6   Hemoglobin 13.0 - 17.0 g/dL 16.1  09.6  04.5   Hematocrit 39.0 - 52.0 % 40.0  40.4  38.1   Platelets 150 - 400 K/uL 310  299  280        Latest Ref Rng & Units 12/10/2023   10:16 AM 09/10/2023    9:50 AM 06/11/2023   10:35 AM  CMP  Glucose 70 - 99 mg/dL 409  811  914   BUN 8 - 23 mg/dL 27  28  31    Creatinine 0.61 - 1.24 mg/dL 7.82  9.56  2.13   Sodium 135 - 145 mmol/L 140  139  140   Potassium 3.5 - 5.1 mmol/L 3.5  3.9  4.4   Chloride 98 - 111 mmol/L 99  98  101   CO2 22 - 32 mmol/L 33  34  33   Calcium 8.9 - 10.3 mg/dL 9.5  9.4  9.6   Total Protein 6.5 - 8.1 g/dL 7.2  6.8  6.9   Total Bilirubin 0.0 - 1.2 mg/dL 0.8  0.8  0.9   Alkaline Phos 38 - 126  U/L 52  58  54   AST 15 - 41 U/L 16  14  16    ALT 0 - 44 U/L 11  10  12      No results found for: "MPROTEIN" No results found for: "KPAFRELGTCHN", "LAMBDASER", "KAPLAMBRATIO"  Pathology:    RADIOGRAPHIC STUDIES: No results found.  ASSESSMENT & PLAN Justin Gibbs 84 y.o. male with medical history significant for polycythemia vera who presents for a follow up visit.   After review of the labs, the records, and  discussion with the patient the findings most consistent with a polycythemia vera which is JAK2 V617 F positive.  These results were confirmed with a bone marrow biopsy.  Given the patient's age greater than 9 he is considered a high risk polycythemia vera and therefore would require cytoreductive therapy with hydroxyurea  500 mg twice daily in addition to every 2 week phlebotomies.  Goal hematocrit for this patient is less than 45%.  Additionally he is on Eliquis  therapy which will serve as anticoagulation for thromboprophylaxis.  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  Patient at goal HCT goal of <45% --Today Hgb 14.1,Hct 40.0%. WBC 8.5, Plt 310.  --patient currently at goal HCT, no need for phlebotomies at this time.  --holding ASA 81mg  PO daily as the patient is on eliquis .   -- Continue to monitor while being treated for prostate cancer as below.  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  --11/27/2021: widespread metastatic disease noted on PSMA scan.  --Underwent radiation therapy, completed this on 01/31/2021. 12/19/2021: first dose of leuprolide  22.5 mg IM -- Most recent NM PSMA scan Feb 2024 showed stable disease. Next imaging in August 2024 Plan: -- Currently received lupron  injections q 3 months. Next due today.  --no indication for zometa therapy at this time --continue zytiga  1000 mg PO daily with 5 mg PO prednisone  PO daily.  --Most recent PSA from 09/10/2023 was 0.1 and testosterone  <3.  --RTC in 3 month to re-evaluate.   No orders of the defined types were placed in this encounter.   All questions were answered. The patient knows to call the clinic with any problems, questions or concerns.  I have spent a total of 30 minutes minutes of  face-to-face and non-face-to-face time, preparing to see the patient, performing a medically appropriate examination, counseling and educating the patient,documenting clinical information in the electronic health record,  and care coordination.   Wyline Hearing PA-C Dept of Hematology and Oncology [facility] Phone: [phone]   12/10/2023 10:53 PM

## 2023-12-12 LAB — PROSTATE-SPECIFIC AG, SERUM (LABCORP): Prostate Specific Ag, Serum: 0.1 ng/mL (ref 0.0–4.0)

## 2023-12-12 LAB — TESTOSTERONE: Testosterone: <3 ng/dL — ABNORMAL LOW (ref 264–916)

## 2023-12-15 ENCOUNTER — Ambulatory Visit: Payer: Self-pay | Admitting: Physician Assistant

## 2023-12-17 ENCOUNTER — Other Ambulatory Visit: Payer: Self-pay

## 2023-12-17 ENCOUNTER — Other Ambulatory Visit: Payer: Self-pay | Admitting: Hematology and Oncology

## 2023-12-17 DIAGNOSIS — C61 Malignant neoplasm of prostate: Secondary | ICD-10-CM

## 2023-12-17 MED ORDER — ABIRATERONE ACETATE 250 MG PO TABS
1000.0000 mg | ORAL_TABLET | Freq: Every day | ORAL | 2 refills | Status: DC
  Filled 2023-12-17: qty 120, 30d supply, fill #0
  Filled 2024-01-16: qty 120, 30d supply, fill #1
  Filled 2024-02-12: qty 120, 30d supply, fill #2
  Filled 2024-03-02 – 2024-03-09 (×2): qty 120, 30d supply, fill #3
  Filled 2024-04-16 – 2024-04-19 (×2): qty 120, 30d supply, fill #4
  Filled 2024-05-17: qty 120, 30d supply, fill #5
  Filled 2024-06-15: qty 120, 30d supply, fill #6
  Filled 2024-07-08: qty 120, 30d supply, fill #7
  Filled 2024-08-06: qty 120, 30d supply, fill #8

## 2023-12-17 NOTE — Progress Notes (Signed)
 Specialty Pharmacy Refill Coordination Note  Justin Gibbs is a 83 y.o. male contacted today regarding refills of specialty medication(s) Abiraterone  Acetate (ZYTIGA )   Patient requested (Patient-Rptd) Delivery   Delivery date: (Patient-Rptd) 12/26/23   Verified address: (Patient-Rptd) [address] [place] [address]   Medication will be filled on 12/25/23.   This fill date is pending response to refill request from provider. Patient was made aware via a voice mail that  if they have not received fill by intended date they must follow up with pharmacy.

## 2023-12-19 ENCOUNTER — Other Ambulatory Visit: Payer: Self-pay

## 2023-12-25 ENCOUNTER — Other Ambulatory Visit: Payer: Self-pay

## 2024-01-16 ENCOUNTER — Encounter (INDEPENDENT_AMBULATORY_CARE_PROVIDER_SITE_OTHER): Payer: Self-pay

## 2024-01-16 ENCOUNTER — Other Ambulatory Visit: Payer: Self-pay

## 2024-01-16 NOTE — Progress Notes (Signed)
 Specialty Pharmacy Refill Coordination Note  Justin Gibbs is a 83 y.o. male contacted today regarding refills of specialty medication(s) Abiraterone  Acetate (ZYTIGA )   Patient requested Delivery   Delivery date: 01/20/24   Verified address: [address] [place]   Medication will be filled on 01/19/24.

## 2024-02-03 ENCOUNTER — Other Ambulatory Visit: Payer: Self-pay

## 2024-02-06 ENCOUNTER — Ambulatory Visit (INDEPENDENT_AMBULATORY_CARE_PROVIDER_SITE_OTHER): Payer: Medicare Other

## 2024-02-06 VITALS — BP 155/83 | HR 69 | Ht 70.0 in | Wt 223.0 lb

## 2024-02-06 DIAGNOSIS — Z0000 Encounter for general adult medical examination without abnormal findings: Secondary | ICD-10-CM

## 2024-02-06 NOTE — Patient Instructions (Signed)
 Justin Gibbs , Thank you for taking time out of your busy schedule to complete your Annual Wellness Visit with me. I enjoyed our conversation and look forward to speaking with you again next year. I, as well as your care team,  appreciate your ongoing commitment to your health goals. Please review the following plan we discussed and let me know if I can assist you in the future. Your Game plan/ To Do List    Follow up Visits: Next Medicare AWV with our clinical staff: 02/08/25 at 3:10p.m. Next Office Visit with your provider: 04/16/24 at 10:35a.m.  Clinician Recommendations:  Aim for 30 minutes of exercise or brisk walking, 6-8 glasses of water , and 5 servings of fruits and vegetables each day.       This is a list of the screening recommended for you and due dates:  Health Maintenance  Topic Date Due   Zoster (Shingles) Vaccine (1 of 2) Never done   Medicare Annual Wellness Visit  02/05/2024   DTaP/Tdap/Td vaccine (1 - Tdap) 10/30/2024*   COVID-19 Vaccine (3 - Pfizer risk series) 02/21/2025*   Flu Shot  02/27/2024   Colon Cancer Screening  08/28/2031   Pneumococcal Vaccine for age over 61  Completed   Hepatitis B Vaccine  Aged Out   HPV Vaccine  Aged Out   Meningitis B Vaccine  Aged Out  *Topic was postponed. The date shown is not the original due date.    Advanced directives: (Copy Requested) Please bring a copy of your health care power of attorney and living will to the office to be added to your chart at your convenience. You can mail to [facility] [address] [place], [place] [address] or email to [email or web address] Advance Care Planning is important because it:  [x]  Makes sure you receive the medical care that is consistent with your values, goals, and preferences  [x]  It provides guidance to your family and loved ones and reduces their decisional burden about whether or not they are making the right decisions based on your wishes.  Follow the link  provided in your after visit summary or read over the paperwork we have mailed to you to help you started getting your Advance Directives in place. If you need assistance in completing these, please reach out to us  so that we can help you!  See attachments for Preventive Care and Fall Prevention Tips.

## 2024-02-06 NOTE — Progress Notes (Signed)
 Subjective:   Justin Gibbs is a 83 y.o. who presents for a Medicare Wellness preventive visit.  As a reminder, Annual Wellness Visits don't include a physical exam, and some assessments may be limited, especially if this visit is performed virtually. We may recommend an in-person follow-up visit with your provider if needed.  Visit Complete: Virtual I connected with  Justin Gibbs on 02/06/24 by a audio enabled telemedicine application and verified that I am speaking with the correct person using two identifiers.  Patient Location: Home  Provider Location: Home Office  I discussed the limitations of evaluation and management by telemedicine. The patient expressed understanding and agreed to proceed.  Vital Signs: Because this visit was a virtual/telehealth visit, some criteria may be missing or patient reported. Any vitals not documented were not able to be obtained and vitals that have been documented are patient reported.  VideoDeclined- This patient declined Librarian, academic. Therefore the visit was completed with audio only.  Persons Participating in Visit: Patient.  AWV Questionnaire: No: Patient Medicare AWV questionnaire was not completed prior to this visit.  Cardiac Risk Factors include: advanced age (>79men, >52 women);dyslipidemia;hypertension;male gender;obesity (BMI >30kg/m2)     Objective:    Today's Vitals   02/06/24 0945  BP: (!) 155/83  Pulse: 69  Weight: 223 lb (101.2 kg)  Height: 5' 10 (1.778 m)   Body mass index is 32 kg/m.     02/06/2024    9:56 AM 02/05/2023    1:53 PM 02/01/2022    3:08 PM 08/27/2021    6:55 AM 08/23/2021   10:29 AM 08/21/2021   10:09 AM 03/19/2021   10:37 AM  Advanced Directives  Does Patient Have a Medical Advance Directive? Yes Yes No No No No Yes  Type of Estate agent of [place];Living will Healthcare Power of [place];Living will     Living will  Copy of Healthcare Power  of Attorney in Chart? No - copy requested No - copy requested       Would patient like information on creating a medical advance directive?   No - Patient declined No - Patient declined No - Patient declined      Current Medications (verified) Outpatient Encounter Medications as of 02/06/2024  Medication Sig   abiraterone  acetate (ZYTIGA ) 250 MG tablet Take 4 tablets (1,000 mg total) by mouth daily. Take on an empty stomach 1 hour before or 2 hours after a meal   apixaban  (ELIQUIS ) 5 MG TABS tablet Take 1 tablet (5 mg total) by mouth in the morning and at bedtime.   hydroxyurea  (HYDREA ) 500 MG capsule TAKE 1 CAPSULE BY MOUTH TWICE DAILY WITH FOOD TO MINIMIZE GI SIDE EFFECTS   lisinopril -hydrochlorothiazide  (ZESTORETIC ) 20-12.5 MG tablet Take 2 tablets by mouth daily.   metoprolol  tartrate (LOPRESSOR ) 25 MG tablet Take 1 tablet (25 mg total) by mouth 2 (two) times daily. **NEEDS TO BE SEEN BEFORE NEXT REFILL**   Multiple Vitamin (MULTIVITAMIN) capsule Take 1 capsule by mouth daily with breakfast.   PARoxetine  (PAXIL ) 10 MG tablet Take 1/2 (one-half) tablet by mouth twice daily   predniSONE  (DELTASONE ) 5 MG tablet Take 1 tablet (5 mg total) by mouth daily with breakfast.   No facility-administered encounter medications on file as of 02/06/2024.    Allergies (verified) Patient has no known allergies.   History: Past Medical History:  Diagnosis Date   Anxiety    Arthritis    Atrial fibrillation (HCC) 06/2020  Cancer [facility])    prostate / removed 2006   Depression    Dysrhythmia    High triglycerides    Hypertension    Polycythemia vera, acquired (HCC)    Prostate cancer [facility])    Past Surgical History:  Procedure Laterality Date   BIOPSY  08/27/2021   Procedure: BIOPSY;  Surgeon: Cindie Carlin POUR, DO;  [facility];  Service: Endoscopy;;   COLONOSCOPY WITH PROPOFOL  N/A 08/27/2021   Procedure: COLONOSCOPY WITH PROPOFOL ;  Surgeon: Cindie Carlin POUR, DO;  [facility];  Service: Endoscopy;  Laterality: N/A;  8:00am   HERNIA REPAIR Bilateral    x 2    POLYPECTOMY  08/27/2021   Procedure: POLYPECTOMY;  Surgeon: Cindie Carlin POUR, DO;  Location: [facility];  Service: Endoscopy;;   PROSTATECTOMY     Family History  Problem Relation Age of Onset   Alzheimer's disease Mother    Alcohol abuse Father    Cancer Father        leukemia   Cirrhosis Father    Stomach cancer Father    Stroke Brother    Diabetes Daughter    Cancer Maternal Grandmother    Cancer Paternal Grandfather    Prostate cancer Brother    Brain cancer Brother    Thyroid  cancer Sister    Social History   Socioeconomic History   Marital status: Married    Spouse name: Dawn   Number of children: 3   Years of education: Not on file   Highest education level: GED or equivalent  Occupational History   Occupation: retired     Comment: 2007  Tobacco Use   Smoking status: Former    Current packs/day: 0.00    Average packs/day: 1 pack/day for 23.0 years (23.0 ttl pk-yrs)    Types: Cigarettes, Cigars    Start date: 06/13/1959    Quit date: 06/12/1982    Years since quitting: 41.6   Smokeless tobacco: Never   Tobacco comments:    I was a light smoker  Vaping Use   Vaping status: Never Used  Substance and Sexual Activity   Alcohol use: Never   Drug use: Never   Sexual activity: Not Currently    Comment: Prostate removal  Nov. 15 2006  Other Topics Concern   Not on file  Social History Narrative   Lives with wife and son. He has a basement with a wood stove.   Social Drivers of Corporate investment banker Strain: Low Risk  (02/06/2024)   Overall Financial Resource Strain (CARDIA)    Difficulty of Paying Living Expenses: Not hard at all  Food Insecurity: No Food Insecurity (02/06/2024)   Hunger Vital Sign    Worried About Running Out of Food in the Last Year: Never true    Ran Out of Food in the Last Year: Never true  Transportation Needs: No Transportation Needs  (02/06/2024)   PRAPARE - Administrator, Civil Service (Medical): No    Lack of Transportation (Non-Medical): No  Physical Activity: Insufficiently Active (02/06/2024)   Exercise Vital Sign    Days of Exercise per Week: 7 days    Minutes of Exercise per Session: 10 min  Stress: No Stress Concern Present (02/06/2024)   [employer] of Occupational Health - Occupational Stress Questionnaire    Feeling of Stress: Not at all  Social Connections: Moderately Isolated (02/06/2024)   Social Connection and Isolation Panel    Frequency of Communication with  Friends and Family: More than three times a week    Frequency of Social Gatherings with Friends and Family: More than three times a week    Attends Religious Services: Never    Database administrator or Organizations: No    Attends Engineer, structural: Never    Marital Status: Married    Tobacco Counseling Counseling given: Yes Tobacco comments: I was a light smoker    Clinical Intake:  Pre-visit preparation completed: Yes  Pain : No/denies pain     BMI - recorded: 32 Nutritional Status: BMI > 30  Obese Nutritional Risks: None Diabetes: No  No results found for: HGBA1C   How often do you need to have someone help you when you read instructions, pamphlets, or other written materials from your doctor or pharmacy?: 1 - Never  Interpreter Needed?: No  Information entered by :: alia t/cma   Activities of Daily Living     02/06/2024    9:51 AM  In your present state of health, do you have any difficulty performing the following activities:  Hearing? 1  Vision? 0  Difficulty concentrating or making decisions? 0  Walking or climbing stairs? 0  Dressing or bathing? 0  Doing errands, shopping? 0  Preparing Food and eating ? N  Using the Toilet? N  In the past six months, have you accidently leaked urine? Y  Do you have problems with loss of bowel control? N  Managing your Medications? N  Managing  your Finances? N  Housekeeping or managing your Housekeeping? N    Patient Care Team: Gladis Mustard, FNP as PCP - General (Family Medicine) Lavona Agent, MD as PCP - Cardiology (Cardiology) Patrcia Cough, MD as Consulting Physician (Radiation Oncology) Federico Norleen ONEIDA MADISON, MD as Consulting Physician (Hematology and Oncology) Crawford Morna Pickle, NP as Nurse Practitioner (Hematology and Oncology) Dove, Natro D, RN as Registered Nurse  I have updated your Care Teams any recent Medical Services you may have received from other providers in the past year.     Assessment:   This is a routine wellness examination for Justin Gibbs.  Hearing/Vision screen Hearing Screening - Comments:: Pt wear hearing aids Vision Screening - Comments:: Pt denies vision dif/pt goes to [facility] in [place], [place]/pt last ov has been yrs ago   Goals Addressed             This Visit's Progress    Exercise 3x per week (30 min per time)   On track    02/01/2022 - Hopes to stay active and independent        Depression Screen     02/06/2024    9:58 AM 10/31/2023    9:14 AM 05/08/2023   10:55 AM 03/04/2023    9:20 AM 02/05/2023    1:52 PM 01/17/2023    9:40 AM 02/01/2022    2:56 PM  PHQ 2/9 Scores  PHQ - 2 Score 0 0 0 0 0 0 0  PHQ- 9 Score 0  3 6 0 4 1    Fall Risk     02/06/2024    9:47 AM 10/31/2023    9:14 AM 05/08/2023   10:54 AM 03/04/2023    9:19 AM 02/05/2023    1:51 PM  Fall Risk   Falls in the past year? 1 1 1 1  0  Number falls in past yr: 1 0 1 1 0  Injury with Fall? 0 1 0 0 0  Risk for fall due to :  Impaired balance/gait;Impaired mobility;Impaired vision History of fall(s) History of fall(s) History of fall(s) No Fall Risks  Follow up Falls evaluation completed;Education provided;Falls prevention discussed Education provided Falls evaluation completed Education provided Falls prevention discussed    MEDICARE RISK AT HOME:  Medicare Risk at Home Any stairs in or around the home?:  Yes If so, are there any without handrails?: Yes Home free of loose throw rugs in walkways, pet beds, electrical cords, etc?: Yes Adequate lighting in your home to reduce risk of falls?: Yes Life alert?: No Use of a cane, walker or w/c?: Yes (cane) Grab bars in the bathroom?: No Shower chair or bench in shower?: No Elevated toilet seat or a handicapped toilet?: Yes  TIMED UP AND GO:  Was the test performed?  no  Cognitive Function: 6CIT completed        02/06/2024   10:08 AM 02/05/2023    1:54 PM 02/01/2022    3:08 PM 01/31/2021    3:00 PM  6CIT Screen  What Year? 0 points 0 points 0 points 0 points  What month? 0 points 0 points 0 points 0 points  What time? 0 points 0 points 0 points 0 points  Count back from 20 0 points 0 points 0 points 0 points  Months in reverse 0 points 0 points 0 points 2 points  Repeat phrase 2 points 0 points 2 points 2 points  Total Score 2 points 0 points 2 points 4 points    Immunizations Immunization History  Administered Date(s) Administered   Fluad Quad(high Dose 65+) 05/02/2021   Influenza,inj,Quad PF,6+ Mos 05/18/2019   Influenza-Unspecified 06/08/2020   PFIZER(Purple Top)SARS-COV-2 Vaccination 11/11/2019, 12/03/2019   Pneumococcal Conjugate-13 07/05/2019   Pneumococcal Polysaccharide-23 08/10/2020    Screening Tests Health Maintenance  Topic Date Due   Zoster Vaccines- Shingrix (1 of 2) Never done   DTaP/Tdap/Td (1 - Tdap) 10/30/2024 (Originally 02/03/1960)   COVID-19 Vaccine (3 - Pfizer risk series) 02/21/2025 (Originally 12/31/2019)   INFLUENZA VACCINE  02/27/2024   Medicare Annual Wellness (AWV)  02/05/2025   Colonoscopy  08/28/2031   Pneumococcal Vaccine: 50+ Years  Completed   Hepatitis B Vaccines  Aged Out   HPV VACCINES  Aged Out   Meningococcal B Vaccine  Aged Out    Health Maintenance  Health Maintenance Due  Topic Date Due   Zoster Vaccines- Shingrix (1 of 2) Never done   Health Maintenance Items Addressed: See  Nurse Notes at the end of this note  Additional Screening:  Vision Screening: Recommended annual ophthalmology exams for early detection of glaucoma and other disorders of the eye. Would you like a referral to an eye doctor? No    Dental Screening: Recommended annual dental exams for proper oral hygiene  Community Resource Referral / Chronic Care Management: CRR required this visit?  No   CCM required this visit?  No   Plan:    I have personally reviewed and noted the following in the patient's chart:   Medical and social history Use of alcohol, tobacco or illicit drugs  Current medications and supplements including opioid prescriptions. Patient is not currently taking opioid prescriptions. Functional ability and status Nutritional status Physical activity Advanced directives List of other physicians Hospitalizations, surgeries, and ER visits in previous 12 months Vitals Screenings to include cognitive, depression, and falls Referrals and appointments  In addition, I have reviewed and discussed with patient certain preventive protocols, quality metrics, and best practice recommendations. A written personalized care plan for preventive services  as well as general preventive health recommendations were provided to patient.   Ozie Ned, CMA   02/06/2024   After Visit Summary: (MyChart) Due to this being a telephonic visit, the after visit summary with patients personalized plan was offered to patient via MyChart   Notes: Patient is aware and due for the Shingles vaccine.

## 2024-02-12 ENCOUNTER — Other Ambulatory Visit: Payer: Self-pay

## 2024-02-12 ENCOUNTER — Other Ambulatory Visit: Payer: Self-pay | Admitting: Pharmacy Technician

## 2024-02-12 ENCOUNTER — Encounter (INDEPENDENT_AMBULATORY_CARE_PROVIDER_SITE_OTHER): Payer: Self-pay

## 2024-02-12 NOTE — Progress Notes (Signed)
 Specialty Pharmacy Refill Coordination Note  Justin Gibbs is a 83 y.o. male contacted today regarding refills of specialty medication(s) Abiraterone  Acetate (ZYTIGA )   Patient requested Delivery   Delivery date: 02/16/24   Verified address: [address] [place] [address]   Medication will be filled on 02/13/24.

## 2024-02-13 ENCOUNTER — Other Ambulatory Visit: Payer: Self-pay

## 2024-03-02 ENCOUNTER — Other Ambulatory Visit: Payer: Self-pay

## 2024-03-09 ENCOUNTER — Other Ambulatory Visit: Payer: Self-pay

## 2024-03-09 NOTE — Progress Notes (Signed)
 Specialty Pharmacy Ongoing Clinical Assessment Note  Justin Gibbs is a 83 y.o. male who is being followed by the specialty pharmacy service for RxSp Oncology   Patient's specialty medication(s) reviewed today: Abiraterone  Acetate (ZYTIGA )   Missed doses in the last 4 weeks: 0   Patient/Caregiver did not have any additional questions or concerns.   Therapeutic benefit summary: Patient is achieving benefit   Adverse events/side effects summary: Experienced adverse events/side effects (Patient reports daytime swelling that improves overnight. He has an office visit scheduled for tomorrow and will have his provider evaluate the edema at that time.)   Patient's therapy is appropriate to: Continue    Goals Addressed             This Visit's Progress    Slow Disease Progression   On track    Patient is on track. Patient will maintain adherence. PSA remains at 0.1 ng/mL as of 12/10/23.          Follow up: 6 months  Silvano LOISE Dolly Specialty Pharmacist

## 2024-03-09 NOTE — Progress Notes (Signed)
 Specialty Pharmacy Refill Coordination Note  Justin Gibbs is a 83 y.o. male contacted today regarding refills of specialty medication(s) Abiraterone  Acetate (ZYTIGA )   Patient requested Delivery   Delivery date: 03/25/24   Verified address: [address] [place] [address]   Medication will be filled on 03/24/24.

## 2024-03-10 ENCOUNTER — Inpatient Hospital Stay: Payer: Medicare Other | Admitting: Hematology and Oncology

## 2024-03-10 ENCOUNTER — Inpatient Hospital Stay: Payer: Medicare Other

## 2024-03-10 ENCOUNTER — Other Ambulatory Visit: Payer: Self-pay | Admitting: Hematology and Oncology

## 2024-03-10 ENCOUNTER — Inpatient Hospital Stay: Payer: Medicare Other | Attending: Hematology and Oncology

## 2024-03-10 VITALS — BP 172/109 | HR 77 | Temp 97.1°F | Resp 19 | Wt 222.1 lb

## 2024-03-10 DIAGNOSIS — Z7952 Long term (current) use of systemic steroids: Secondary | ICD-10-CM | POA: Insufficient documentation

## 2024-03-10 DIAGNOSIS — Z7901 Long term (current) use of anticoagulants: Secondary | ICD-10-CM | POA: Diagnosis not present

## 2024-03-10 DIAGNOSIS — Z7964 Long term (current) use of myelosuppressive agent: Secondary | ICD-10-CM | POA: Insufficient documentation

## 2024-03-10 DIAGNOSIS — Z5111 Encounter for antineoplastic chemotherapy: Secondary | ICD-10-CM | POA: Diagnosis not present

## 2024-03-10 DIAGNOSIS — Z923 Personal history of irradiation: Secondary | ICD-10-CM | POA: Insufficient documentation

## 2024-03-10 DIAGNOSIS — C61 Malignant neoplasm of prostate: Secondary | ICD-10-CM | POA: Diagnosis present

## 2024-03-10 DIAGNOSIS — D45 Polycythemia vera: Secondary | ICD-10-CM

## 2024-03-10 DIAGNOSIS — C7951 Secondary malignant neoplasm of bone: Secondary | ICD-10-CM | POA: Diagnosis not present

## 2024-03-10 DIAGNOSIS — I4891 Unspecified atrial fibrillation: Secondary | ICD-10-CM | POA: Diagnosis not present

## 2024-03-10 DIAGNOSIS — Z9079 Acquired absence of other genital organ(s): Secondary | ICD-10-CM | POA: Diagnosis not present

## 2024-03-10 DIAGNOSIS — Z7982 Long term (current) use of aspirin: Secondary | ICD-10-CM | POA: Insufficient documentation

## 2024-03-10 DIAGNOSIS — Z79899 Other long term (current) drug therapy: Secondary | ICD-10-CM | POA: Insufficient documentation

## 2024-03-10 DIAGNOSIS — D75839 Thrombocytosis, unspecified: Secondary | ICD-10-CM | POA: Diagnosis not present

## 2024-03-10 LAB — CMP (CANCER CENTER ONLY)
ALT: 15 U/L (ref 0–44)
AST: 17 U/L (ref 15–41)
Albumin: 4 g/dL (ref 3.5–5.0)
Alkaline Phosphatase: 53 U/L (ref 38–126)
Anion gap: 4 — ABNORMAL LOW (ref 5–15)
BUN: 24 mg/dL — ABNORMAL HIGH (ref 8–23)
CO2: 33 mmol/L — ABNORMAL HIGH (ref 22–32)
Calcium: 9 mg/dL (ref 8.9–10.3)
Chloride: 102 mmol/L (ref 98–111)
Creatinine: 1.05 mg/dL (ref 0.61–1.24)
GFR, Estimated: >60 mL/min (ref >60)
Glucose, Bld: 124 mg/dL — ABNORMAL HIGH (ref 70–99)
Potassium: 3.9 mmol/L (ref 3.5–5.1)
Sodium: 139 mmol/L (ref 135–145)
Total Bilirubin: 0.8 mg/dL (ref 0.0–1.2)
Total Protein: 6.5 g/dL (ref 6.5–8.1)

## 2024-03-10 LAB — CBC WITH DIFFERENTIAL (CANCER CENTER ONLY)
Abs Immature Granulocytes: 0.02 K/uL (ref 0.00–0.07)
Basophils Absolute: 0 K/uL (ref 0.0–0.1)
Basophils Relative: 1 %
Eosinophils Absolute: 0 K/uL (ref 0.0–0.5)
Eosinophils Relative: 1 %
HCT: 38.2 % — ABNORMAL LOW (ref 39.0–52.0)
Hemoglobin: 13.6 g/dL (ref 13.0–17.0)
Immature Granulocytes: 0 %
Lymphocytes Relative: 9 %
Lymphs Abs: 0.7 K/uL (ref 0.7–4.0)
MCH: 38.2 pg — ABNORMAL HIGH (ref 26.0–34.0)
MCHC: 35.6 g/dL (ref 30.0–36.0)
MCV: 107.3 fL — ABNORMAL HIGH (ref 80.0–100.0)
Monocytes Absolute: 0.5 K/uL (ref 0.1–1.0)
Monocytes Relative: 6 %
Neutro Abs: 6.8 K/uL (ref 1.7–7.7)
Neutrophils Relative %: 83 %
Platelet Count: 282 K/uL (ref 150–400)
RBC: 3.56 MIL/uL — ABNORMAL LOW (ref 4.22–5.81)
RDW: 12.8 % (ref 11.5–15.5)
WBC Count: 8.1 K/uL (ref 4.0–10.5)
nRBC: 0 % (ref 0.0–0.2)

## 2024-03-10 LAB — FERRITIN: Ferritin: 287 ng/mL (ref 24–336)

## 2024-03-10 MED ORDER — LEUPROLIDE ACETATE (3 MONTH) 22.5 MG ~~LOC~~ KIT
22.5000 mg | PACK | Freq: Once | SUBCUTANEOUS | Status: AC
  Administered 2024-03-10 (×2): 22.5 mg via SUBCUTANEOUS
  Filled 2024-03-10: qty 22.5

## 2024-03-10 NOTE — Progress Notes (Signed)
 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 Federico  10/11/2020: started phebotomy q 2 weeks and hydroxyurea  500mg  BID 11/15/2020: WBC 9.0, Hgb 14.9, HCT 44.5%, Plt 384. Holding phlebotomies, patient at target.   #Hx of Prostate Cancer 2006: robotic resection of prostate.  11/16/2019: PSA 0.21([facility])  08/08/2020: PSA 0.36 ([facility])  11/28/2021: NM PSMA scan showed widespread skeletal metastatic disease involving calvarium, ribs, spine, pelvis and bilateral proximal femora 12/19/2021: first dose of leuprolide  22.5 mg IM 01/23/2022: added abiraterone  1000 mg with prednisone .  03/07/2022: PSMA scan showed similar to minimal improvement in osseous metastasis  Interval History:  Justin Gibbs 83 y.o. male with medical history significant for polycythemia vera and prostate cancer who presents for a follow up visit. The patient's last visit was on 12/10/2023. In the interim since the last visit the patient has had no major changes in his health.  On exam today Justin Gibbs reports he has been well overall in the room since her last visit.  His blood pressure is elevated today and is up to 172/109.  He reports he is not having any lightheadedness, dizziness, or shortness of breath.  He reports he is  taking lisinopril  with hydrochlorothiazide  in the morning and at night.  This has been causing him to have nighttime urinations and is getting up about 2 times per hour.  He notes that he was also having some recent issue with inflammation of his foreskin but this improved once his catheter was removed.  He continues taking his Zytiga  and prednisone  as prescribed as well as his hydroxyurea .  He reports he is not having any side effects from his medications.  He denies any nausea, Oni, or diarrhea.  He reports that he is not having any issues with ulcers in his mouth or on his ankles.  He reports he does have some occasional hot flashes and sweats but he barely notices them at this point.  Overall he feels well and has no additional questions concerns or complaints today.  A full 10 point ROS is otherwise negative.  MEDICAL HISTORY:  Past Medical History:  Diagnosis Date   Anxiety    Arthritis    Atrial fibrillation (HCC) 06/2020   Cancer (HCC)    prostate / removed 2006   Depression    Dysrhythmia    High triglycerides    Hypertension    Polycythemia vera, acquired (HCC)    Prostate cancer (HCC)     SURGICAL HISTORY: Past Surgical History:  Procedure Laterality Date   BIOPSY  08/27/2021   Procedure: BIOPSY;  Surgeon: Cindie Carlin POUR, DO;  [facility];  Service: Endoscopy;;   COLONOSCOPY WITH PROPOFOL  N/A 08/27/2021   Procedure: COLONOSCOPY WITH PROPOFOL ;  Surgeon: Cindie Carlin POUR, DO;  Location: [facility];  Service: Endoscopy;  Laterality: N/A;  8:00am   HERNIA REPAIR Bilateral    x  2    POLYPECTOMY  08/27/2021   Procedure: POLYPECTOMY;  Surgeon: Cindie Carlin POUR, DO;  Location: [facility];  Service: Endoscopy;;   PROSTATECTOMY      SOCIAL HISTORY: Social History   Socioeconomic History   Marital status: Married    Spouse name: Dawn   Number of children: 3   Years of education: Not on file   Highest education level: GED or equivalent  Occupational History    Occupation: retired     Comment: 2007  Tobacco Use   Smoking status: Former    Current packs/day: 0.00    Average packs/day: 1 pack/day for 23.0 years (23.0 ttl pk-yrs)    Types: Cigarettes, Cigars    Start date: 06/13/1959    Quit date: 06/12/1982    Years since quitting: 41.7   Smokeless tobacco: Never   Tobacco comments:    I was a light smoker  Vaping Use   Vaping status: Never Used  Substance and Sexual Activity   Alcohol use: Never   Drug use: Never   Sexual activity: Not Currently    Comment: Prostate removal  Nov. 15 2006  Other Topics Concern   Not on file  Social History Narrative   Lives with wife and son. He has a basement with a wood stove.   Social Drivers of Corporate investment banker Strain: Low Risk  (02/06/2024)   Overall Financial Resource Strain (CARDIA)    Difficulty of Paying Living Expenses: Not hard at all  Food Insecurity: No Food Insecurity (02/06/2024)   Hunger Vital Sign    Worried About Running Out of Food in the Last Year: Never true    Ran Out of Food in the Last Year: Never true  Transportation Needs: No Transportation Needs (02/06/2024)   PRAPARE - Administrator, Civil Service (Medical): No    Lack of Transportation (Non-Medical): No  Physical Activity: Insufficiently Active (02/06/2024)   Exercise Vital Sign    Days of Exercise per Week: 7 days    Minutes of Exercise per Session: 10 min  Stress: No Stress Concern Present (02/06/2024)   [employer] of Occupational Health - Occupational Stress Questionnaire    Feeling of Stress: Not at all  Social Connections: Moderately Isolated (02/06/2024)   Social Connection and Isolation Panel    Frequency of Communication with Friends and Family: More than three times a week    Frequency of Social Gatherings with Friends and Family: More than three times a week    Attends Religious Services: Never    Database administrator or Organizations: No    Attends Banker  Meetings: Never    Marital Status: Married  Catering manager Violence: Not At Risk (02/06/2024)   Humiliation, Afraid, Rape, and Kick questionnaire    Fear of Current or Ex-Partner: No    Emotionally Abused: No    Physically Abused: No    Sexually Abused: No    FAMILY HISTORY: Family History  Problem Relation Age of Onset   Alzheimer's disease Mother    Alcohol abuse Father    Cancer Father        leukemia   Cirrhosis Father    Stomach cancer Father    Stroke Brother    Diabetes Daughter    Cancer Maternal Grandmother    Cancer Paternal Grandfather    Prostate cancer Brother    Brain cancer Brother    Thyroid  cancer Sister     ALLERGIES:  has no known allergies.  MEDICATIONS:  Current Outpatient Medications  Medication Sig Dispense Refill   abiraterone  acetate (ZYTIGA ) 250 MG tablet Take 4 tablets (1,000 mg total) by mouth daily. Take on an empty stomach 1 hour before or 2 hours after a meal 360 tablet 2   apixaban  (ELIQUIS ) 5 MG TABS tablet Take 1 tablet (5 mg total) by mouth in the morning and at bedtime. 60 tablet 6   hydroxyurea  (HYDREA ) 500 MG capsule TAKE 1 CAPSULE BY MOUTH TWICE DAILY WITH FOOD TO MINIMIZE GI SIDE EFFECTS 180 capsule 0   lisinopril -hydrochlorothiazide  (ZESTORETIC ) 20-12.5 MG tablet Take 2 tablets by mouth daily. 180 tablet 1   metoprolol  tartrate (LOPRESSOR ) 25 MG tablet Take 1 tablet (25 mg total) by mouth 2 (two) times daily. **NEEDS TO BE SEEN BEFORE NEXT REFILL** 180 tablet 1   Multiple Vitamin (MULTIVITAMIN) capsule Take 1 capsule by mouth daily with breakfast.     PARoxetine  (PAXIL ) 10 MG tablet Take 1/2 (one-half) tablet by mouth twice daily 90 tablet 1   predniSONE  (DELTASONE ) 5 MG tablet Take 1 tablet (5 mg total) by mouth daily with breakfast. 90 tablet 1   No current facility-administered medications for this visit.    REVIEW OF SYSTEMS:   Constitutional: ( - ) fevers, ( - )  chills , ( - ) night sweats Eyes: ( - ) blurriness of vision,  ( - ) double vision, ( - ) watery eyes Ears, nose, mouth, throat, and face: ( - ) mucositis, ( - ) sore throat Respiratory: ( - ) cough, ( - ) dyspnea, ( - ) wheezes Cardiovascular: ( - ) palpitation, ( - ) chest discomfort, ( - ) lower extremity swelling Gastrointestinal:  ( - ) nausea, ( - ) heartburn, ( - ) change in bowel habits Skin: ( - ) abnormal skin rashes Lymphatics: ( - ) new lymphadenopathy, ( - ) easy bruising Neurological: ( - ) numbness, ( - ) tingling, ( - ) new weaknesses Behavioral/Psych: ( - ) mood change, ( - ) new changes  All other systems were reviewed with the patient and are negative.  PHYSICAL EXAMINATION:  Vitals:   03/10/24 1113 03/10/24 1114  BP: (!) 185/104 (!) 172/109  Pulse: 77   Resp: 19   Temp: (!) 97.1 F (36.2 C)   SpO2: 98%    Filed Weights   03/10/24 1113  Weight: 222 lb 1.6 oz (100.7 kg)   GENERAL: well appearing elderly Caucasian male alert, no distress and comfortable SKIN: skin color, texture, turgor are normal, no rashes or significant lesions EYES: conjunctiva are pink and non-injected, sclera clear LUNGS: clear to auscultation and percussion with normal breathing effort HEART: regular rate & rhythm and no murmurs.  Musculoskeletal: no cyanosis of digits and no clubbing  PSYCH: alert & oriented x 3, fluent speech NEURO: no focal motor/sensory deficits  LABORATORY DATA:  I have reviewed the data as listed    Latest Ref Rng & Units 03/10/2024   10:18 AM 12/10/2023   10:16 AM 09/10/2023    9:50 AM  CBC  WBC 4.0 - 10.5 K/uL 8.1  8.5  7.7   Hemoglobin 13.0 - 17.0 g/dL 86.3  85.8  85.9   Hematocrit 39.0 - 52.0 % 38.2  40.0  40.4   Platelets 150 - 400 K/uL 282  310  299        Latest Ref Rng & Units 03/10/2024   10:18 AM 12/10/2023   10:16 AM 09/10/2023  9:50 AM  CMP  Glucose 70 - 99 mg/dL 875  861  809   BUN 8 - 23 mg/dL 24  27  28    Creatinine 0.61 - 1.24 mg/dL 8.94  8.92  8.85   Sodium 135 - 145 mmol/L 139  140  139    Potassium 3.5 - 5.1 mmol/L 3.9  3.5  3.9   Chloride 98 - 111 mmol/L 102  99  98   CO2 22 - 32 mmol/L 33  33  34   Calcium 8.9 - 10.3 mg/dL 9.0  9.5  9.4   Total Protein 6.5 - 8.1 g/dL 6.5  7.2  6.8   Total Bilirubin 0.0 - 1.2 mg/dL 0.8  0.8  0.8   Alkaline Phos 38 - 126 U/L 53  52  58   AST 15 - 41 U/L 17  16  14    ALT 0 - 44 U/L 15  11  10      No results found for: MPROTEIN No results found for: KPAFRELGTCHN, LAMBDASER, KAPLAMBRATIO  Pathology:    RADIOGRAPHIC STUDIES: No results found.  ASSESSMENT & PLAN Justin Gibbs 83 y.o. male with medical history significant for polycythemia vera who presents for a follow up visit.   After review of the labs, the records, and discussion with the patient the findings most consistent with a polycythemia vera which is JAK2 V617 F positive.  These results were confirmed with a bone marrow biopsy.  Given the patient's age greater than 59 he is considered a high risk polycythemia vera and therefore would require cytoreductive therapy with hydroxyurea  500 mg twice daily in addition to every 2 week phlebotomies.  Goal hematocrit for this patient is less than 45%.  Additionally he is on Eliquis  therapy which will serve as anticoagulation for thromboprophylaxis.  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  Patient at goal HCT goal of <45% --Today Hgb 13.6, WBC 8.1, MCV 107.3, Plt 282  --patient currently at goal HCT, no need for phlebotomies at this time.  --holding ASA 81mg  PO daily as the patient is on eliquis .   -- Continue to monitor while being treated for prostate cancer as below.  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  --11/27/2021: widespread metastatic disease noted on PSMA scan.  --Underwent  radiation therapy, completed this on 01/31/2021. 12/19/2021: first dose of leuprolide  22.5 mg IM -- Most recent NM PSMA scan Feb 2024 showed stable disease. Next imaging in August 2024 Plan: -- Currently received lupron  injections q 3 months. Next due today.  --no indication for zometa therapy at this time --continue zytiga  1000 mg PO daily with 5 mg PO prednisone  PO daily.  --Most recent PSA from 03/10/2024 was 0.1 and testosterone  <3.  --RTC in 3 month to re-evaluate.   No orders of the defined types were placed in this encounter.   All questions were answered. The patient knows to call the clinic with any problems, questions or concerns.  I have spent a total of 30 minutes minutes of face-to-face and non-face-to-face time, preparing to see the patient, performing a medically appropriate examination, counseling and educating the patient,documenting clinical information in the electronic health record,  and care coordination.   Norleen IVAR Kidney, MD Department of Hematology/Oncology [facility] Phone: [phone] Pager: [phone] Email:  [email or web address]  03/14/2024 4:45 PM

## 2024-03-11 LAB — PROSTATE-SPECIFIC AG, SERUM (LABCORP): Prostate Specific Ag, Serum: <0.1 ng/mL (ref 0.0–4.0)

## 2024-03-11 LAB — TESTOSTERONE: Testosterone: <3 ng/dL — ABNORMAL LOW (ref 264–916)

## 2024-03-12 ENCOUNTER — Ambulatory Visit: Payer: Self-pay

## 2024-03-12 NOTE — Telephone Encounter (Signed)
-----   Message from Norleen ONEIDA Kidney IV sent at 03/12/2024  8:59 AM EDT ----- Please let Mr. Wiginton know that his PSA remains undetectable.  He responding very well to treatment.  Will plan to see him back as scheduled in 3 months time. ----- Message ----- From: Rebecka, Lab In [place] Sent: 03/10/2024  10:29 AM EDT To: Norleen ONEIDA Kidney MADISON, MD

## 2024-03-12 NOTE — Telephone Encounter (Signed)
 Called pt per MD and s/w his wife, Stephane. She was given results and is agreeable to 3 mo f/u plan.

## 2024-03-14 ENCOUNTER — Encounter: Payer: Self-pay | Admitting: Hematology and Oncology

## 2024-03-24 ENCOUNTER — Other Ambulatory Visit: Payer: Self-pay

## 2024-03-25 ENCOUNTER — Telehealth: Payer: Self-pay

## 2024-03-25 NOTE — Telephone Encounter (Signed)
 Copied from CRM #[id number]. Topic: General - Other >> Mar 25, 2024 10:59 AM Mia F wrote: Reason for CRM: Massie from [facility] verification unit calling to check pt dx to get him enrolled for insurance plan. Please call back at [phone] [id number]#[id number]

## 2024-04-04 ENCOUNTER — Other Ambulatory Visit: Payer: Self-pay | Admitting: Nurse Practitioner

## 2024-04-04 DIAGNOSIS — I10 Essential (primary) hypertension: Secondary | ICD-10-CM

## 2024-04-16 ENCOUNTER — Other Ambulatory Visit: Payer: Self-pay

## 2024-04-16 ENCOUNTER — Encounter (INDEPENDENT_AMBULATORY_CARE_PROVIDER_SITE_OTHER): Payer: Self-pay

## 2024-04-16 ENCOUNTER — Ambulatory Visit: Admitting: Nurse Practitioner

## 2024-04-16 ENCOUNTER — Ambulatory Visit: Admitting: Family Medicine

## 2024-04-16 ENCOUNTER — Encounter: Payer: Self-pay | Admitting: Family Medicine

## 2024-04-16 ENCOUNTER — Other Ambulatory Visit: Payer: Self-pay | Admitting: Hematology and Oncology

## 2024-04-16 VITALS — BP 162/92 | HR 84 | Temp 95.7°F | Ht 70.0 in | Wt 219.8 lb

## 2024-04-16 DIAGNOSIS — C61 Malignant neoplasm of prostate: Secondary | ICD-10-CM

## 2024-04-16 DIAGNOSIS — R9721 Rising PSA following treatment for malignant neoplasm of prostate: Secondary | ICD-10-CM

## 2024-04-16 DIAGNOSIS — R9431 Abnormal electrocardiogram [ECG] [EKG]: Secondary | ICD-10-CM | POA: Diagnosis not present

## 2024-04-16 DIAGNOSIS — D45 Polycythemia vera: Secondary | ICD-10-CM | POA: Diagnosis not present

## 2024-04-16 DIAGNOSIS — F411 Generalized anxiety disorder: Secondary | ICD-10-CM

## 2024-04-16 DIAGNOSIS — J069 Acute upper respiratory infection, unspecified: Secondary | ICD-10-CM | POA: Diagnosis not present

## 2024-04-16 DIAGNOSIS — E782 Mixed hyperlipidemia: Secondary | ICD-10-CM

## 2024-04-16 DIAGNOSIS — I480 Paroxysmal atrial fibrillation: Secondary | ICD-10-CM

## 2024-04-16 DIAGNOSIS — I10 Essential (primary) hypertension: Secondary | ICD-10-CM

## 2024-04-16 DIAGNOSIS — Z191 Hormone sensitive malignancy status: Secondary | ICD-10-CM | POA: Diagnosis not present

## 2024-04-16 LAB — LIPID PANEL

## 2024-04-16 MED ORDER — LISINOPRIL-HYDROCHLOROTHIAZIDE 20-12.5 MG PO TABS: 2.0000 | ORAL_TABLET | Freq: Every day | ORAL | 1 refills | Status: AC

## 2024-04-16 MED ORDER — METOPROLOL TARTRATE 25 MG PO TABS: 25.0000 mg | ORAL_TABLET | Freq: Two times a day (BID) | ORAL | 1 refills | Status: AC

## 2024-04-16 MED ORDER — APIXABAN 5 MG PO TABS: 5.0000 mg | ORAL_TABLET | Freq: Two times a day (BID) | ORAL | 6 refills | Status: AC

## 2024-04-16 MED ORDER — PAROXETINE HCL 10 MG PO TABS: ORAL_TABLET | ORAL | 1 refills | Status: AC

## 2024-04-16 MED ORDER — AMOXICILLIN-POT CLAVULANATE 875-125 MG PO TABS: 1.0000 | ORAL_TABLET | Freq: Two times a day (BID) | ORAL | 0 refills | Status: DC

## 2024-04-16 NOTE — Patient Instructions (Addendum)
Coricidin HBP  Plain Mucinex

## 2024-04-16 NOTE — Progress Notes (Signed)
 Subjective:  Patient ID: Justin Gibbs, male    DOB: 1941/06/11, 83 y.o.   MRN: [id number]  Patient Care Team: Justin Rock HERO, FNP as PCP - General (Family Medicine) Justin Agent, MD as PCP - Cardiology (Cardiology) Justin Cough, MD as Consulting Physician (Radiation Oncology) Justin Norleen ONEIDA MADISON, MD as Consulting Physician (Hematology and Oncology) Justin Gibbs, Justin Pickle, NP as Nurse Practitioner (Hematology and Oncology) Justin Wendelyn BIRCH, RN as Registered Nurse   Chief Complaint:  Establish Care (Previous MMM patient )   HPI: Justin Gibbs is a 82 y.o. male presenting on 04/16/2024 for Establish Care (Previous MMM patient )   Justin Gibbs is an 83 year old male with prostate cancer and polycythemia vera who presents with urinary bleeding and congestion.  He has a recurrence of prostate cancer, initially treated with radiation therapy, and is currently managed with abiraterone  acetate. His prostate cancer is being monitored alongside his polycythemia vera.  He was diagnosed with polycythemia vera, a condition characterized by excessive production of blood cells by the bone marrow. He manages this condition with hydroxyurea , taking 250 mg twice daily, which effectively controls his blood cell levels.  He experiences urinary bleeding associated with Eliquis  use, which he takes for atrial fibrillation. During bleeding episodes, he pauses the medication until the bleeding resolves. He recalls a significant episode of urinary bleeding in October 2021, leading to an emergency room visit and Foley catheter insertion, resulting in prolonged bleeding until its removal.  He has a history of atrial fibrillation, managed with metoprolol  and eliquis  No palpitations or irregular heartbeats are reported.   He takes paroxetine  (Paxil ) for anxiety, at a dose of 5 mg daily, which he finds effective. He also takes prednisone  daily.  He reports recent congestion over the past two to  three days, for which he has taken Nyquil. He experiences consistent shortness of breath and attributes recent illness to exposure from his son's wife, who works as a Midwife.  No cholesterol issues and is not on statins due to allergies, recalling a previous adverse reaction to atorvastatin.         04/16/2024   10:37 AM 02/06/2024    9:58 AM 10/31/2023    9:14 AM 05/08/2023   10:55 AM 03/04/2023    9:20 AM  Depression screen PHQ 2/9  Decreased Interest 0 0 0 0 0  Down, Depressed, Hopeless 0 0 0 0 0  PHQ - 2 Score 0 0 0 0 0  Altered sleeping 2 0  0 0  Tired, decreased energy 2 0  2 2  Change in appetite 0 0  0 0  Feeling bad or failure about yourself  0 0  0 0  Trouble concentrating 0 0  1 2  Moving slowly or fidgety/restless 0 0  0 2  Suicidal thoughts 0 0  0 0  PHQ-9 Score 4 0  3 6  Difficult doing work/chores Not difficult at all Not difficult at all  Not difficult at all Not difficult at all      04/16/2024   10:38 AM 10/31/2023    9:14 AM 05/08/2023   10:55 AM 03/04/2023    9:20 AM  GAD 7 : Generalized Anxiety Score  Nervous, Anxious, on Edge 0 0 0 0  Control/stop worrying 0 0 0 0  Worry too much - different things 0 0 0 0  Trouble relaxing 1 0 1 0  Restless 0 0 0 0  Easily annoyed or irritable 0 0 0 0  Afraid - awful might happen 0 0 0 0  Total GAD 7 Score 1 0 1 0  Anxiety Difficulty Not difficult at all Not difficult at all Not difficult at all Not difficult at all        Relevant past medical, surgical, family, and social history reviewed and updated as indicated.  Allergies and medications reviewed and updated. Data reviewed: Chart in Epic.   Past Medical History:  Diagnosis Date   Anxiety    Arthritis    Atrial fibrillation (HCC) 06/2020   Cancer [facility])    prostate / removed 2006   Depression    Dysrhythmia    High triglycerides    Hypertension    Polycythemia vera, acquired (HCC)    Prostate cancer [facility])     Past Surgical History:   Procedure Laterality Date   BIOPSY  08/27/2021   Procedure: BIOPSY;  Surgeon: Justin Gibbs;  [facility];  Service: Endoscopy;;   COLONOSCOPY WITH PROPOFOL  N/A 08/27/2021   Procedure: COLONOSCOPY WITH PROPOFOL ;  Surgeon: Justin Gibbs;  [facility];  Service: Endoscopy;  Laterality: N/A;  8:00am   HERNIA REPAIR Bilateral    x 2    POLYPECTOMY  08/27/2021   Procedure: POLYPECTOMY;  Surgeon: Justin Gibbs;  Location: [facility];  Service: Endoscopy;;   PROSTATECTOMY      Social History   Socioeconomic History   Marital status: Married    Spouse name: Justin Gibbs   Number of children: 3   Years of education: Not on file   Highest education level: GED or equivalent  Occupational History   Occupation: retired     Comment: 2007  Tobacco Use   Smoking status: Former    Current packs/day: 0.00    Average packs/day: 1 pack/day for 23.0 years (23.0 ttl pk-yrs)    Types: Cigarettes, Cigars    Start date: 06/13/1959    Quit date: 06/12/1982    Years since quitting: 41.8   Smokeless tobacco: Never   Tobacco comments:    I was a light smoker  Vaping Use   Vaping status: Never Used  Substance and Sexual Activity   Alcohol use: Never   Drug use: Never   Sexual activity: Not Currently    Comment: Prostate removal  Nov. 15 2006  Other Topics Concern   Not on file  Social History Narrative   Lives with wife and son. He has a basement with a wood stove.   Social Drivers of Corporate investment banker Strain: Low Risk  (04/14/2024)   Overall Financial Resource Strain (CARDIA)    Difficulty of Paying Living Expenses: Not hard at all  Food Insecurity: No Food Insecurity (04/14/2024)   Hunger Vital Sign    Worried About Running Out of Food in the Last Year: Never true    Ran Out of Food in the Last Year: Never true  Transportation Needs: No Transportation Needs (04/14/2024)   PRAPARE - Administrator, Civil Service (Medical): No     Lack of Transportation (Non-Medical): No  Physical Activity: Insufficiently Active (04/14/2024)   Exercise Vital Sign    Days of Exercise per Week: 1 day    Minutes of Exercise per Session: 20 min  Stress: No Stress Concern Present (04/14/2024)   [employer] of Occupational Health - Occupational Stress Questionnaire    Feeling of Stress: Only a little  Social Connections: Moderately Isolated (04/14/2024)   Social Connection and Isolation Panel    Frequency of Communication with Friends and Family: More than three times a week    Frequency of Social Gatherings with Friends and Family: Once a week    Attends Religious Services: Never    Database administrator or Organizations: No    Attends Engineer, structural: Not on file    Marital Status: Married  Catering manager Violence: Not At Risk (02/06/2024)   Humiliation, Afraid, Rape, and Kick questionnaire    Fear of Current or Ex-Partner: No    Emotionally Abused: No    Physically Abused: No    Sexually Abused: No    Outpatient Encounter Medications as of 04/16/2024  Medication Sig   abiraterone  acetate (ZYTIGA ) 250 MG tablet Take 4 tablets (1,000 mg total) by mouth daily. Take on an empty stomach 1 hour before or 2 hours after a meal   amoxicillin -clavulanate (AUGMENTIN ) 875-125 MG tablet Take 1 tablet by mouth 2 (two) times daily.   hydroxyurea  (HYDREA ) 500 MG capsule TAKE 1 CAPSULE BY MOUTH TWICE DAILY WITH FOOD TO  MINIMIZE  GI  SIDE  EFFECTS   Multiple Vitamin (MULTIVITAMIN) capsule Take 1 capsule by mouth daily with breakfast.   predniSONE  (DELTASONE ) 5 MG tablet Take 1 tablet (5 mg total) by mouth daily with breakfast.   [DISCONTINUED] apixaban  (ELIQUIS ) 5 MG TABS tablet Take 1 tablet (5 mg total) by mouth in the morning and at bedtime.   [DISCONTINUED] lisinopril -hydrochlorothiazide  (ZESTORETIC ) 20-12.5 MG tablet Take 2 tablets by mouth daily.   [DISCONTINUED] metoprolol  tartrate (LOPRESSOR ) 25 MG tablet Take 1 tablet  (25 mg total) by mouth 2 (two) times daily. **NEEDS TO BE SEEN BEFORE NEXT REFILL**   [DISCONTINUED] PARoxetine  (PAXIL ) 10 MG tablet Take 1/2 (one-half) tablet by mouth twice daily   apixaban  (ELIQUIS ) 5 MG TABS tablet Take 1 tablet (5 mg total) by mouth in the morning and at bedtime.   lisinopril -hydrochlorothiazide  (ZESTORETIC ) 20-12.5 MG tablet Take 2 tablets by mouth daily.   metoprolol  tartrate (LOPRESSOR ) 25 MG tablet Take 1 tablet (25 mg total) by mouth 2 (two) times daily.   PARoxetine  (PAXIL ) 10 MG tablet Take 1/2 (one-half) tablet by mouth twice daily   No facility-administered encounter medications on file as of 04/16/2024.    No Known Allergies  Pertinent ROS per HPI, otherwise unremarkable      Objective:  BP (!) 162/92   Pulse 84   Temp (!) 95.7 F (35.4 C)   Ht 5' 10 (1.778 m)   Wt 219 lb 12.8 oz (99.7 kg)   SpO2 97%   BMI 31.54 kg/m    Wt Readings from Last 3 Encounters:  04/16/24 219 lb 12.8 oz (99.7 kg)  03/10/24 222 lb 1.6 oz (100.7 kg)  02/06/24 223 lb (101.2 kg)    Physical Exam Vitals and nursing note reviewed.  Constitutional:      General: He is not in acute distress.    Appearance: He is obese. He is ill-appearing (chronically ill). He is not toxic-appearing or diaphoretic.  HENT:     Head: Normocephalic and atraumatic.     Right Ear: Decreased hearing noted.     Left Ear: Decreased hearing noted.     Ears:     Comments: Bilateral hearing aids    Nose: Nose normal.     Mouth/Throat:     Lips: Pink.     Mouth: Mucous membranes are moist.  Pharynx: No posterior oropharyngeal erythema or postnasal drip.  Eyes:     Conjunctiva/sclera: Conjunctivae normal.     Pupils: Pupils are equal, round, and reactive to light.  Cardiovascular:     Rate and Rhythm: Normal rate. Rhythm irregularly irregular.  Pulmonary:     Effort: Pulmonary effort is normal.     Breath sounds: Wheezing and rhonchi (clears with Gibbs) present.  Musculoskeletal:      Right lower leg: Edema present.     Left lower leg: Edema present.  Skin:    General: Skin is warm and dry.     Capillary Refill: Capillary refill takes less than 2 seconds.  Neurological:     General: No focal deficit present.     Mental Status: He is alert and oriented to person, place, and time.     Gait: Gait abnormal (using cane).  Psychiatric:        Mood and Affect: Mood normal.        Behavior: Behavior normal.        Thought Content: Thought content normal.        Judgment: Judgment normal.       Results for orders placed or performed in visit on 03/10/24  Prostate-Specific AG, Serum   Collection Time: 03/10/24 10:18 AM  Result Value Ref Range   Prostate Specific Ag, Serum <0.1 0.0 - 4.0 ng/mL  Testosterone    Collection Time: 03/10/24 10:18 AM  Result Value Ref Range   Testosterone  <3 (L) 264 - 916 ng/dL  Ferritin   Collection Time: 03/10/24 10:18 AM  Result Value Ref Range   Ferritin 287 24 - 336 ng/mL  CMP (Cancer Center only)   Collection Time: 03/10/24 10:18 AM  Result Value Ref Range   Sodium 139 135 - 145 mmol/L   Potassium 3.9 3.5 - 5.1 mmol/L   Chloride 102 98 - 111 mmol/L   CO2 33 (H) 22 - 32 mmol/L   Glucose, Bld 124 (H) 70 - 99 mg/dL   BUN 24 (H) 8 - 23 mg/dL   Creatinine 8.94 9.38 - 1.24 mg/dL   Calcium 9.0 8.9 - 89.6 mg/dL   Total Protein 6.5 6.5 - 8.1 g/dL   Albumin 4.0 3.5 - 5.0 g/dL   AST 17 15 - 41 U/L   ALT 15 0 - 44 U/L   Alkaline Phosphatase 53 38 - 126 U/L   Total Bilirubin 0.8 0.0 - 1.2 mg/dL   GFR, Estimated >39 >39 mL/min   Anion gap 4 (L) 5 - 15  CBC with Differential (Cancer Center Only)   Collection Time: 03/10/24 10:18 AM  Result Value Ref Range   WBC Count 8.1 4.0 - 10.5 K/uL   RBC 3.56 (L) 4.22 - 5.81 MIL/uL   Hemoglobin 13.6 13.0 - 17.0 g/dL   HCT 61.7 (L) 60.9 - 47.9 %   MCV 107.3 (H) 80.0 - 100.0 fL   MCH 38.2 (H) 26.0 - 34.0 pg   MCHC 35.6 30.0 - 36.0 g/dL   RDW 87.1 88.4 - 84.4 %   Platelet Count 282 150 - 400  K/uL   nRBC 0.0 0.0 - 0.2 %   Neutrophils Relative % 83 %   Neutro Abs 6.8 1.7 - 7.7 K/uL   Lymphocytes Relative 9 %   Lymphs Abs 0.7 0.7 - 4.0 K/uL   Monocytes Relative 6 %   Monocytes Absolute 0.5 0.1 - 1.0 K/uL   Eosinophils Relative 1 %   Eosinophils Absolute 0.0 0.0 -  0.5 K/uL   Basophils Relative 1 %   Basophils Absolute 0.0 0.0 - 0.1 K/uL   Immature Granulocytes 0 %   Abs Immature Granulocytes 0.02 0.00 - 0.07 K/uL       Pertinent labs & imaging results that were available during my care of the patient were reviewed by me and considered in my medical decision making.  Assessment & Plan:  Gunther Zawadzki was seen today for establish care.  Diagnoses and all orders for this visit:  Paroxysmal atrial fibrillation (HCC) -     metoprolol  tartrate (LOPRESSOR ) 25 MG tablet; Take 1 tablet (25 mg total) by mouth 2 (two) times daily. -     apixaban  (ELIQUIS ) 5 MG TABS tablet; Take 1 tablet (5 mg total) by mouth in the morning and at bedtime. -     Anemia Profile B -     CMP14+EGFR -     Lipid panel -     Thyroid  Panel With TSH  GAD (generalized anxiety disorder) -     PARoxetine  (PAXIL ) 10 MG tablet; Take 1/2 (one-half) tablet by mouth twice daily -     Anemia Profile B -     CMP14+EGFR -     Thyroid  Panel With TSH  Essential hypertension -     lisinopril -hydrochlorothiazide  (ZESTORETIC ) 20-12.5 MG tablet; Take 2 tablets by mouth daily. 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     Recurrent prostate cancer Recurrent prostate cancer, initially treated with radiation therapy. Currently managed with abiraterone  acetate. No chemotherapy administered. 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 Bladder wall abnormalities noted, likely due to radiation, with potential bleeding sites identified. He declined bladder cauterization at this time. - Continue current management of Eliquis  with intermittent cessation during bleeding episodes - Consider bladder cauterization if hematuria persists  Paroxysmal atrial fibrillation Paroxysmal atrial fibrillation managed with metoprolol  and Eliquis . No current symptoms of palpitations or irregular heartbeats. Cardiologist advised intermittent cessation of Eliquis  during bleeding episodes. - Continue metoprolol  as prescribed - Continue Eliquis  with current management strategy  Polycythemia vera Polycythemia vera diagnosed in 2021, managed with hydroxyurea . Blood cell counts are currently stable. 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 Recent upper respiratory infection likely contracted from family member. - Prescribe Augmentin  for upper respiratory infection - Advise to complete full course of antibiotics to prevent superinfection  Essential hypertension Essential hypertension well-controlled with lisinopril  HCTZ and metoprolol . -  Continue lisinopril  HCTZ and metoprolol  as prescribed  Generalized anxiety disorder Generalized anxiety disorder well-managed with paroxetine . He self-adjusts dose to 5 mg daily, which is effective. - Continue paroxetine  at current dose of 5 mg  daily  Mixed hyperlipidemia Mixed hyperlipidemia with intolerance to statins and fish oil. No current treatment due to adverse reactions.  Chronic nasal congestion, URI Gibbs and congestion  Chronic nasal congestion with recent exacerbation. Currently using nasal spray for symptom relief. - Recommend Coricidin HBP for congestion - Recommend plain Mucinex with increased water  intake       Continue all other maintenance medications.  Follow up plan: Return for 4-5 months  chronic follow up.   Continue healthy lifestyle choices, including diet (rich in fruits, vegetables, and lean proteins, and low in salt and simple carbohydrates) and exercise (at least 30 minutes of moderate physical activity daily).  Educational handout given for health maintenance   The above assessment and management plan was discussed with the patient. The patient verbalized understanding of and has agreed to the management plan. Patient is aware to call the clinic if they develop any new symptoms or if symptoms persist or worsen. Patient is aware when to return to the clinic for a follow-up visit. Patient educated on when it is appropriate to go to the emergency department.   Rosaline Bruns, FNP-C [facility] [phone]

## 2024-04-17 LAB — ANEMIA PROFILE B
Basophils Absolute: 0 x10E3/uL (ref 0.0–0.2)
Basos: 1 %
EOS (ABSOLUTE): 0 x10E3/uL (ref 0.0–0.4)
Eos: 1 %
Ferritin: 351 ng/mL (ref 30–400)
Folate: 18.3 ng/mL (ref >3.0)
Hematocrit: 40.1 % (ref 37.5–51.0)
Hemoglobin: 13.6 g/dL (ref 13.0–17.7)
Immature Grans (Abs): 0 x10E3/uL (ref 0.0–0.1)
Immature Granulocytes: 0 %
Iron Saturation: 29 % (ref 15–55)
Iron: 83 ug/dL (ref 38–169)
Lymphocytes Absolute: 0.6 x10E3/uL — ABNORMAL LOW (ref 0.7–3.1)
Lymphs: 11 %
MCH: 38.1 pg — ABNORMAL HIGH (ref 26.6–33.0)
MCHC: 33.9 g/dL (ref 31.5–35.7)
MCV: 112 fL — ABNORMAL HIGH (ref 79–97)
Monocytes Absolute: 0.3 x10E3/uL (ref 0.1–0.9)
Monocytes: 5 %
Neutrophils Absolute: 4.7 x10E3/uL (ref 1.4–7.0)
Neutrophils: 81 %
Platelets: 288 x10E3/uL (ref 150–450)
RBC: 3.57 x10E6/uL — ABNORMAL LOW (ref 4.14–5.80)
RDW: 12.2 % (ref 11.6–15.4)
Retic Ct Pct: 1.8 % (ref 0.6–2.6)
Total Iron Binding Capacity: 283 ug/dL (ref 250–450)
UIBC: 200 ug/dL (ref 111–343)
Vitamin B-12: 471 pg/mL (ref 232–1245)
WBC: 5.7 x10E3/uL (ref 3.4–10.8)

## 2024-04-17 LAB — CMP14+EGFR
ALT: 16 IU/L (ref 0–44)
AST: 20 IU/L (ref 0–40)
Albumin: 3.9 g/dL (ref 3.7–4.7)
Alkaline Phosphatase: 62 IU/L (ref 48–129)
BUN/Creatinine Ratio: 22 (ref 10–24)
BUN: 28 mg/dL — ABNORMAL HIGH (ref 8–27)
Bilirubin Total: 0.5 mg/dL (ref 0.0–1.2)
CO2: 27 mmol/L (ref 20–29)
Calcium: 9.7 mg/dL (ref 8.6–10.2)
Chloride: 96 mmol/L (ref 96–106)
Creatinine, Ser: 1.25 mg/dL (ref 0.76–1.27)
Globulin, Total: 2.4 g/dL (ref 1.5–4.5)
Glucose: 197 mg/dL — ABNORMAL HIGH (ref 70–99)
Potassium: 3.9 mmol/L (ref 3.5–5.2)
Sodium: 138 mmol/L (ref 134–144)
Total Protein: 6.3 g/dL (ref 6.0–8.5)
eGFR: 58 mL/min/1.73 — ABNORMAL LOW (ref >59)

## 2024-04-17 LAB — LIPID PANEL
Chol/HDL Ratio: 3.5 ratio (ref 0.0–5.0)
Cholesterol, Total: 159 mg/dL (ref 100–199)
HDL: 45 mg/dL (ref >39)
LDL Chol Calc (NIH): 91 mg/dL (ref 0–99)
Triglycerides: 126 mg/dL (ref 0–149)
VLDL Cholesterol Cal: 23 mg/dL (ref 5–40)

## 2024-04-17 LAB — THYROID PANEL WITH TSH
Free Thyroxine Index: 1.9 (ref 1.2–4.9)
T3 Uptake Ratio: 26 % (ref 24–39)
T4, Total: 7.2 ug/dL (ref 4.5–12.0)
TSH: 3.68 u[IU]/mL (ref 0.450–4.500)

## 2024-04-18 ENCOUNTER — Ambulatory Visit: Payer: Self-pay | Admitting: Family Medicine

## 2024-04-19 ENCOUNTER — Telehealth: Payer: Self-pay | Admitting: Family Medicine

## 2024-04-19 ENCOUNTER — Other Ambulatory Visit: Payer: Self-pay

## 2024-04-19 NOTE — Progress Notes (Signed)
 Specialty Pharmacy Refill Coordination Note  Justin Gibbs is a 83 y.o. male contacted today regarding refills of specialty medication(s) Abiraterone  Acetate (ZYTIGA )   Patient requested No data recorded  Delivery date: 04/22/24   Verified address: [address] , [place] [address]   Medication will be filled on 04/21/24.

## 2024-04-19 NOTE — Telephone Encounter (Signed)
 Copied from CRM [id number]. Topic: Clinical - Lab/Test Results >> Apr 19, 2024 10:38 AM Willma R wrote: Reason for CRM: Relayed lab results. Patient states the day he had the lab results taken he had a bowl of Maple and brown sugar oatmeal at 7:30am.  No additional questions.

## 2024-04-20 ENCOUNTER — Other Ambulatory Visit: Payer: Self-pay

## 2024-05-15 ENCOUNTER — Encounter (INDEPENDENT_AMBULATORY_CARE_PROVIDER_SITE_OTHER): Payer: Self-pay

## 2024-05-17 ENCOUNTER — Other Ambulatory Visit: Payer: Self-pay

## 2024-05-17 NOTE — Progress Notes (Signed)
 Specialty Pharmacy Refill Coordination Note  Justin Gibbs is a 83 y.o. male contacted today regarding refills of specialty medication(s) Abiraterone  Acetate (ZYTIGA )   Patient requested Delivery   Delivery date: 05/19/24   Verified address: [address] [place], [place].     [id number]   Medication will be filled on 05/18/24.

## 2024-06-01 ENCOUNTER — Other Ambulatory Visit: Payer: Self-pay | Admitting: Physician Assistant

## 2024-06-01 ENCOUNTER — Other Ambulatory Visit: Payer: Self-pay | Admitting: Hematology and Oncology

## 2024-06-01 DIAGNOSIS — D45 Polycythemia vera: Secondary | ICD-10-CM

## 2024-06-01 DIAGNOSIS — C61 Malignant neoplasm of prostate: Secondary | ICD-10-CM

## 2024-06-01 NOTE — Progress Notes (Unsigned)
 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 Federico  10/11/2020: started phebotomy q 2 weeks and hydroxyurea  500mg  BID 11/15/2020: WBC 9.0, Hgb 14.9, HCT 44.5%, Plt 384. Holding phlebotomies, patient at target.   #Hx of Prostate Cancer 2006: robotic resection of prostate.  11/16/2019: PSA 0.21([facility])  08/08/2020: PSA 0.36 ([facility])  11/28/2021: NM PSMA scan showed widespread skeletal metastatic disease involving calvarium, ribs, spine, pelvis and bilateral proximal femora 12/19/2021: first dose of leuprolide  22.5 mg IM 01/23/2022: added abiraterone  1000 mg with prednisone .  03/07/2022: PSMA scan showed similar to minimal improvement in osseous metastasis  Interval History:  Justin Gibbs 83 y.o. male with medical history significant for polycythemia vera and prostate cancer who presents for a follow up visit. The patient's last visit was on 03/10/2024. In the interim since the last visit the patient has had no major changes in his health.  On exam today Justin Gibbs reports he is doing well without any new or concerning symptoms.  His energy is stable.  He is tolerating his Hydrea  therapy and Zytiga /prednisone  therapy.  He denies nausea, vomiting or bowel habit changes.  He does have some upper  respiratory congestion and postnasal drip.  He currently sprays nasal spray with improvement of symptoms.  He denies easy bruising or signs of active bleeding.  He denies fevers, chills, night sweats, shortness of breath, chest pain or Gibbs.  He has no other complaints.  Rest of the 10 point ROS as below.  MEDICAL HISTORY:  Past Medical History:  Diagnosis Date   Anxiety    Arthritis    Atrial fibrillation (HCC) 06/2020   Cancer (HCC)    prostate / removed 2006   Depression    Dysrhythmia    High triglycerides    Hypertension    Polycythemia vera, acquired (HCC)    Prostate cancer (HCC)     SURGICAL HISTORY: Past Surgical History:  Procedure Laterality Date   BIOPSY  08/27/2021   Procedure: BIOPSY;  Surgeon: Justin Carlin POUR, DO;  [facility];  Service: Endoscopy;;   COLONOSCOPY WITH PROPOFOL  N/A 08/27/2021   Procedure: COLONOSCOPY WITH PROPOFOL ;  Surgeon: Justin Carlin POUR, DO;  [facility];  Service: Endoscopy;  Laterality: N/A;  8:00am   HERNIA REPAIR Bilateral    x 2    POLYPECTOMY  08/27/2021   Procedure: POLYPECTOMY;  Surgeon: Justin Carlin POUR, DO;  [facility];  Service: Endoscopy;;   PROSTATECTOMY      SOCIAL HISTORY: Social History   Socioeconomic History   Marital status: Married    Spouse name: Justin Gibbs   Number of children: 3   Years of education: Not on file   Highest education level: GED or equivalent  Occupational History   Occupation: retired     Comment: 2007  Tobacco Use   Smoking status: Former    Current  packs/day: 0.00    Average packs/day: 1 pack/day for 23.0 years (23.0 ttl pk-yrs)    Types: Cigarettes, Cigars    Start date: 06/13/1959    Quit date: 06/12/1982    Years since quitting: 42.0   Smokeless tobacco: Never   Tobacco comments:    I was a light smoker  Vaping Use   Vaping status: Never Used  Substance and Sexual Activity   Alcohol use: Never   Drug use: Never   Sexual activity: Not Currently     Comment: Prostate removal  Nov. 15 2006  Other Topics Concern   Not on file  Social History Narrative   Lives with wife and son. He has a basement with a wood stove.   Social Drivers of Corporate Investment Banker Strain: Low Risk  (04/14/2024)   Overall Financial Resource Strain (CARDIA)    Difficulty of Paying Living Expenses: Not hard at all  Food Insecurity: No Food Insecurity (04/14/2024)   Hunger Vital Sign    Worried About Running Out of Food in the Last Year: Never true    Ran Out of Food in the Last Year: Never true  Transportation Needs: No Transportation Needs (04/14/2024)   PRAPARE - Administrator, Civil Service (Medical): No    Lack of Transportation (Non-Medical): No  Physical Activity: Insufficiently Active (04/14/2024)   Exercise Vital Sign    Days of Exercise per Week: 1 day    Minutes of Exercise per Session: 20 min  Stress: No Stress Concern Present (04/14/2024)   Justin Gibbs    Feeling of Stress: Only a little  Social Connections: Moderately Isolated (04/14/2024)   Social Connection and Isolation Panel    Frequency of Communication with Friends and Family: More than three times a week    Frequency of Social Gatherings with Friends and Family: Once a week    Attends Religious Services: Never    Database Administrator or Organizations: No    Attends Engineer, Structural: Not on file    Marital Status: Married  Catering Manager Violence: Not At Risk (02/06/2024)   Humiliation, Afraid, Rape, and Kick Gibbs    Fear of Current or Ex-Partner: No    Emotionally Abused: No    Physically Abused: No    Sexually Abused: No    FAMILY HISTORY: Family History  Problem Relation Age of Onset   Alzheimer's disease Mother    Alcohol abuse Father    Cancer Father        leukemia   Cirrhosis Father    Stomach cancer Father    Thyroid  cancer Sister    Stroke Brother    Prostate  cancer Brother    Brain cancer Brother    Diabetes Daughter    Cancer Maternal Grandmother    Cancer Paternal Grandfather     ALLERGIES:  has no known allergies.  MEDICATIONS:  Current Outpatient Medications  Medication Sig Dispense Refill   abiraterone  acetate (ZYTIGA ) 250 MG tablet Take 4 tablets (1,000 mg total) by mouth daily. Take on an empty stomach 1 hour before or 2 hours after a meal 360 tablet 2   apixaban  (ELIQUIS ) 5 MG TABS tablet Take 1 tablet (5 mg total) by mouth in the morning and at bedtime. 60 tablet 6   hydroxyurea  (HYDREA ) 500 MG capsule TAKE 1 CAPSULE BY MOUTH TWICE DAILY WITH FOOD TO  MINIMIZE  GI  SIDE  EFFECTS 180  capsule 0   lisinopril -hydrochlorothiazide  (ZESTORETIC ) 20-12.5 MG tablet Take 2 tablets by mouth daily. 180 tablet 1   metoprolol  tartrate (LOPRESSOR ) 25 MG tablet Take 1 tablet (25 mg total) by mouth 2 (two) times daily. 180 tablet 1   Multiple Vitamin (MULTIVITAMIN) capsule Take 1 capsule by mouth daily with breakfast.     PARoxetine  (PAXIL ) 10 MG tablet Take 1/2 (one-half) tablet by mouth twice daily 90 tablet 1   predniSONE  (DELTASONE ) 5 MG tablet Take 1 tablet (5 mg total) by mouth daily with breakfast. 90 tablet 1   No current facility-administered medications for this visit.    REVIEW OF SYSTEMS:   Constitutional: ( - ) fevers, ( - )  chills , ( - ) night sweats Eyes: ( - ) blurriness of vision, ( - ) double vision, ( - ) watery eyes Ears, nose, mouth, throat, and face: ( - ) mucositis, ( - ) sore throat Respiratory: ( - ) Gibbs, ( - ) dyspnea, ( - ) wheezes Cardiovascular: ( - ) palpitation, ( - ) chest discomfort, ( - ) lower extremity swelling Gastrointestinal:  ( - ) nausea, ( - ) heartburn, ( - ) change in bowel habits Skin: ( - ) abnormal skin rashes Lymphatics: ( - ) new lymphadenopathy, ( - ) easy bruising Neurological: ( - ) numbness, ( - ) tingling, ( - ) new weaknesses Behavioral/Psych: ( - ) mood change, ( - ) new changes  All  other systems were reviewed with the patient and are negative.  PHYSICAL EXAMINATION:  Vitals:   06/02/24 1123 06/02/24 1128  BP: (!) 141/92 (!) 132/93  Pulse: 73   Resp: 18   Temp: (!) 97.5 F (36.4 C)   SpO2: 98%     Filed Weights   06/02/24 1123  Weight: 227 lb (103 kg)    GENERAL: well appearing elderly Caucasian male alert, no distress and comfortable SKIN: skin color, texture, turgor are normal, no rashes or significant lesions EYES: conjunctiva are pink and non-injected, sclera clear LUNGS: clear to auscultation and percussion with normal breathing effort HEART: regular rate & rhythm and no murmurs.  Musculoskeletal: no cyanosis of digits and no clubbing  PSYCH: alert & oriented x 3, fluent speech NEURO: no focal motor/sensory deficits  LABORATORY DATA:  I have reviewed the data as listed    Latest Ref Rng & Units 06/02/2024   10:30 AM 04/16/2024   10:56 AM 03/10/2024   10:18 AM  CBC  WBC 4.0 - 10.5 K/uL 7.3  5.7  8.1   Hemoglobin 13.0 - 17.0 g/dL 85.5  86.3  86.3   Hematocrit 39.0 - 52.0 % 40.2  40.1  38.2   Platelets 150 - 400 K/uL 311  288  282        Latest Ref Rng & Units 06/02/2024   10:30 AM 04/16/2024   10:56 AM 03/10/2024   10:18 AM  CMP  Glucose 70 - 99 mg/dL 885  802  875   BUN 8 - 23 mg/dL 29  28  24    Creatinine 0.61 - 1.24 mg/dL 8.73  8.74  8.94   Sodium 135 - 145 mmol/L 140  138  139   Potassium 3.5 - 5.1 mmol/L 3.5  3.9  3.9   Chloride 98 - 111 mmol/L 100  96  102   CO2 22 - 32 mmol/L 33  27  33   Calcium 8.9 - 10.3 mg/dL 9.4  9.7  9.0   Total  Protein 6.5 - 8.1 g/dL 7.1  6.3  6.5   Total Bilirubin 0.0 - 1.2 mg/dL 0.8  0.5  0.8   Alkaline Phos 38 - 126 U/L 53  62  53   AST 15 - 41 U/L 15  20  17    ALT 0 - 44 U/L 10  16  15      No results found for: MPROTEIN No results found for: KPAFRELGTCHN, LAMBDASER, KAPLAMBRATIO  Pathology:    RADIOGRAPHIC STUDIES: No results found.  ASSESSMENT & PLAN Justin Gibbs is a 83 y.o.  male with medical history significant for polycythemia vera who presents for a follow up visit.   After review of the labs, the records, and discussion with the patient the findings most consistent with a polycythemia vera which is JAK2 V617 F positive.  These results were confirmed with a bone marrow biopsy.  Given the patient's age greater than 54 he is considered a high risk polycythemia vera and therefore would require cytoreductive therapy with hydroxyurea  500 mg twice daily in addition to every 2 week phlebotomies.  Goal hematocrit for this patient is less than 45%.  Additionally he is on Eliquis  therapy which will serve as anticoagulation for thromboprophylaxis.  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  Patient at goal HCT goal of <45% --Labs today show WBC 7.3, hemoglobin 14.4, hematocrit 40.2%, platelets 311, creatinine 1.26, LFTs normal. --patient currently at goal HCT, no need for phlebotomies at this time.  --holding ASA 81mg  PO daily as the patient is on eliquis .   -- Continue to monitor while being treated for prostate cancer as below.  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  --11/27/2021: widespread metastatic disease noted on PSMA scan.  --Underwent radiation therapy, completed this on 01/31/2021. 12/19/2021: first dose of leuprolide  22.5 mg IM -- Most recent NM PSMA scan Feb 2024 showed stable disease. Next imaging in August 2024 Plan: -- Currently received lupron  injections q 3 months. Next due today.  --no indication for zometa therapy at this time --continue zytiga  1000 mg PO daily with 5 mg PO prednisone  PO daily.  --Most recent PSA from 03/10/2024 was 0.1 and testosterone  <3.  --RTC in 3 month to re-evaluate.   No orders of the defined  types were placed in this encounter.   All questions were answered. The patient knows to call the clinic with any problems, questions or concerns.  I have spent a total of 30 minutes minutes of face-to-face and non-face-to-face time, preparing to see the patient, performing a medically appropriate examination, counseling and educating the patient,documenting clinical information in the electronic health record,  and care coordination.   Johnston Police PA-C Dept of Hematology and Oncology [facility] at [facility] Phone: [phone]   06/02/2024 12:17 PM

## 2024-06-02 ENCOUNTER — Inpatient Hospital Stay: Attending: Hematology and Oncology

## 2024-06-02 ENCOUNTER — Inpatient Hospital Stay: Admitting: Physician Assistant

## 2024-06-02 ENCOUNTER — Inpatient Hospital Stay

## 2024-06-02 VITALS — BP 132/93 | HR 73 | Temp 97.5°F | Resp 18 | Ht 70.0 in | Wt 227.0 lb

## 2024-06-02 VITALS — BP 136/86 | HR 69 | Temp 97.7°F | Resp 18

## 2024-06-02 DIAGNOSIS — Z923 Personal history of irradiation: Secondary | ICD-10-CM | POA: Insufficient documentation

## 2024-06-02 DIAGNOSIS — Z8042 Family history of malignant neoplasm of prostate: Secondary | ICD-10-CM | POA: Insufficient documentation

## 2024-06-02 DIAGNOSIS — Z800 Family history of malignant neoplasm of digestive organs: Secondary | ICD-10-CM | POA: Insufficient documentation

## 2024-06-02 DIAGNOSIS — Z79899 Other long term (current) drug therapy: Secondary | ICD-10-CM | POA: Diagnosis not present

## 2024-06-02 DIAGNOSIS — Z806 Family history of leukemia: Secondary | ICD-10-CM | POA: Insufficient documentation

## 2024-06-02 DIAGNOSIS — Z5111 Encounter for antineoplastic chemotherapy: Secondary | ICD-10-CM | POA: Diagnosis present

## 2024-06-02 DIAGNOSIS — Z7964 Long term (current) use of myelosuppressive agent: Secondary | ICD-10-CM | POA: Diagnosis not present

## 2024-06-02 DIAGNOSIS — Z7982 Long term (current) use of aspirin: Secondary | ICD-10-CM | POA: Insufficient documentation

## 2024-06-02 DIAGNOSIS — D45 Polycythemia vera: Secondary | ICD-10-CM

## 2024-06-02 DIAGNOSIS — Z7901 Long term (current) use of anticoagulants: Secondary | ICD-10-CM | POA: Diagnosis not present

## 2024-06-02 DIAGNOSIS — C61 Malignant neoplasm of prostate: Secondary | ICD-10-CM

## 2024-06-02 DIAGNOSIS — Z9079 Acquired absence of other genital organ(s): Secondary | ICD-10-CM | POA: Insufficient documentation

## 2024-06-02 DIAGNOSIS — Z87891 Personal history of nicotine dependence: Secondary | ICD-10-CM | POA: Insufficient documentation

## 2024-06-02 DIAGNOSIS — C7951 Secondary malignant neoplasm of bone: Secondary | ICD-10-CM | POA: Insufficient documentation

## 2024-06-02 DIAGNOSIS — Z7952 Long term (current) use of systemic steroids: Secondary | ICD-10-CM | POA: Diagnosis not present

## 2024-06-02 LAB — CBC WITH DIFFERENTIAL (CANCER CENTER ONLY)
Abs Immature Granulocytes: 0.02 K/uL (ref 0.00–0.07)
Basophils Absolute: 0 K/uL (ref 0.0–0.1)
Basophils Relative: 0 %
Eosinophils Absolute: 0 K/uL (ref 0.0–0.5)
Eosinophils Relative: 1 %
HCT: 40.2 % (ref 39.0–52.0)
Hemoglobin: 14.4 g/dL (ref 13.0–17.0)
Immature Granulocytes: 0 %
Lymphocytes Relative: 10 %
Lymphs Abs: 0.8 K/uL (ref 0.7–4.0)
MCH: 38.7 pg — ABNORMAL HIGH (ref 26.0–34.0)
MCHC: 35.8 g/dL (ref 30.0–36.0)
MCV: 108.1 fL — ABNORMAL HIGH (ref 80.0–100.0)
Monocytes Absolute: 0.4 K/uL (ref 0.1–1.0)
Monocytes Relative: 6 %
Neutro Abs: 6.1 K/uL (ref 1.7–7.7)
Neutrophils Relative %: 83 %
Platelet Count: 311 K/uL (ref 150–400)
RBC: 3.72 MIL/uL — ABNORMAL LOW (ref 4.22–5.81)
RDW: 12.9 % (ref 11.5–15.5)
WBC Count: 7.3 K/uL (ref 4.0–10.5)
nRBC: 0 % (ref 0.0–0.2)

## 2024-06-02 LAB — PSA: Prostatic Specific Antigen: 0.11 ng/mL (ref 0.00–4.00)

## 2024-06-02 LAB — CMP (CANCER CENTER ONLY)
ALT: 10 U/L (ref 0–44)
AST: 15 U/L (ref 15–41)
Albumin: 4.1 g/dL (ref 3.5–5.0)
Alkaline Phosphatase: 53 U/L (ref 38–126)
Anion gap: 7 (ref 5–15)
BUN: 29 mg/dL — ABNORMAL HIGH (ref 8–23)
CO2: 33 mmol/L — ABNORMAL HIGH (ref 22–32)
Calcium: 9.4 mg/dL (ref 8.9–10.3)
Chloride: 100 mmol/L (ref 98–111)
Creatinine: 1.26 mg/dL — ABNORMAL HIGH (ref 0.61–1.24)
GFR, Estimated: 57 mL/min — ABNORMAL LOW (ref >60)
Glucose, Bld: 114 mg/dL — ABNORMAL HIGH (ref 70–99)
Potassium: 3.5 mmol/L (ref 3.5–5.1)
Sodium: 140 mmol/L (ref 135–145)
Total Bilirubin: 0.8 mg/dL (ref 0.0–1.2)
Total Protein: 7.1 g/dL (ref 6.5–8.1)

## 2024-06-02 LAB — FERRITIN: Ferritin: 171 ng/mL (ref 24–336)

## 2024-06-02 MED ORDER — LEUPROLIDE ACETATE (3 MONTH) 22.5 MG ~~LOC~~ KIT
22.5000 mg | PACK | Freq: Once | SUBCUTANEOUS | Status: AC
  Administered 2024-06-02: 22.5 mg via SUBCUTANEOUS
  Filled 2024-06-02: qty 22.5

## 2024-06-03 ENCOUNTER — Ambulatory Visit: Payer: Self-pay | Admitting: Physician Assistant

## 2024-06-03 LAB — TESTOSTERONE: Testosterone: <3 ng/dL — ABNORMAL LOW (ref 264–916)

## 2024-06-09 ENCOUNTER — Other Ambulatory Visit: Payer: Self-pay

## 2024-06-15 ENCOUNTER — Other Ambulatory Visit: Payer: Self-pay

## 2024-06-15 ENCOUNTER — Other Ambulatory Visit: Payer: Self-pay | Admitting: Hematology and Oncology

## 2024-06-15 ENCOUNTER — Encounter (INDEPENDENT_AMBULATORY_CARE_PROVIDER_SITE_OTHER): Payer: Self-pay

## 2024-06-15 MED ORDER — PREDNISONE 5 MG PO TABS
5.0000 mg | ORAL_TABLET | Freq: Every day | ORAL | 1 refills | Status: AC
  Filled 2024-06-15: qty 90, 90d supply, fill #0

## 2024-06-15 NOTE — Progress Notes (Signed)
 Specialty Pharmacy Refill Coordination Note  Justin Gibbs is a 83 y.o. male contacted today regarding refills of specialty medication(s) Abiraterone  Acetate (ZYTIGA )   Patient requested (Patient-Rptd) Delivery   Delivery date: 06/18/24   Verified address: (Patient-Rptd) [address] [place] [address]   Medication will be filled on: 06/17/24

## 2024-06-16 ENCOUNTER — Other Ambulatory Visit: Payer: Self-pay

## 2024-06-29 ENCOUNTER — Observation Stay (HOSPITAL_COMMUNITY)
Admission: EM | Admit: 2024-06-29 | Discharge: 2024-06-30 | Disposition: A | Discharge status: Home / Self Care | Attending: Family Medicine | Admitting: Family Medicine

## 2024-06-29 ENCOUNTER — Emergency Department (HOSPITAL_COMMUNITY)

## 2024-06-29 ENCOUNTER — Encounter (HOSPITAL_COMMUNITY): Payer: Self-pay | Admitting: Emergency Medicine

## 2024-06-29 ENCOUNTER — Other Ambulatory Visit: Payer: Self-pay

## 2024-06-29 DIAGNOSIS — Z923 Personal history of irradiation: Secondary | ICD-10-CM | POA: Diagnosis not present

## 2024-06-29 DIAGNOSIS — Z87891 Personal history of nicotine dependence: Secondary | ICD-10-CM | POA: Insufficient documentation

## 2024-06-29 DIAGNOSIS — Z79899 Other long term (current) drug therapy: Secondary | ICD-10-CM | POA: Diagnosis not present

## 2024-06-29 DIAGNOSIS — I639 Cerebral infarction, unspecified: Principal | ICD-10-CM | POA: Diagnosis present

## 2024-06-29 DIAGNOSIS — R471 Dysarthria and anarthria: Secondary | ICD-10-CM | POA: Diagnosis not present

## 2024-06-29 DIAGNOSIS — R4701 Aphasia: Secondary | ICD-10-CM | POA: Diagnosis present

## 2024-06-29 DIAGNOSIS — Z7901 Long term (current) use of anticoagulants: Secondary | ICD-10-CM | POA: Insufficient documentation

## 2024-06-29 DIAGNOSIS — E782 Mixed hyperlipidemia: Secondary | ICD-10-CM | POA: Diagnosis present

## 2024-06-29 DIAGNOSIS — I10 Essential (primary) hypertension: Secondary | ICD-10-CM | POA: Diagnosis not present

## 2024-06-29 DIAGNOSIS — R29810 Facial weakness: Secondary | ICD-10-CM | POA: Diagnosis present

## 2024-06-29 DIAGNOSIS — E669 Obesity, unspecified: Secondary | ICD-10-CM | POA: Diagnosis not present

## 2024-06-29 DIAGNOSIS — I4811 Longstanding persistent atrial fibrillation: Secondary | ICD-10-CM

## 2024-06-29 DIAGNOSIS — Z6833 Body mass index (BMI) 33.0-33.9, adult: Secondary | ICD-10-CM | POA: Diagnosis not present

## 2024-06-29 DIAGNOSIS — E66811 Obesity, class 1: Secondary | ICD-10-CM | POA: Insufficient documentation

## 2024-06-29 DIAGNOSIS — D45 Polycythemia vera: Secondary | ICD-10-CM | POA: Diagnosis present

## 2024-06-29 DIAGNOSIS — I482 Chronic atrial fibrillation, unspecified: Secondary | ICD-10-CM | POA: Diagnosis not present

## 2024-06-29 DIAGNOSIS — I6389 Other cerebral infarction: Secondary | ICD-10-CM | POA: Diagnosis not present

## 2024-06-29 LAB — PROTIME-INR
INR: 1 (ref 0.8–1.2)
Prothrombin Time: 13.4 s (ref 11.4–15.2)

## 2024-06-29 LAB — CBC WITH DIFFERENTIAL/PLATELET
Abs Immature Granulocytes: 0.03 K/uL (ref 0.00–0.07)
Basophils Absolute: 0 K/uL (ref 0.0–0.1)
Basophils Relative: 1 %
Eosinophils Absolute: 0.1 K/uL (ref 0.0–0.5)
Eosinophils Relative: 1 %
HCT: 42 % (ref 39.0–52.0)
Hemoglobin: 14.4 g/dL (ref 13.0–17.0)
Immature Granulocytes: 0 %
Lymphocytes Relative: 11 %
Lymphs Abs: 0.8 K/uL (ref 0.7–4.0)
MCH: 38.3 pg — ABNORMAL HIGH (ref 26.0–34.0)
MCHC: 34.3 g/dL (ref 30.0–36.0)
MCV: 111.7 fL — ABNORMAL HIGH (ref 80.0–100.0)
Monocytes Absolute: 0.4 K/uL (ref 0.1–1.0)
Monocytes Relative: 6 %
Neutro Abs: 6.1 K/uL (ref 1.7–7.7)
Neutrophils Relative %: 81 %
Platelets: 313 K/uL (ref 150–400)
RBC: 3.76 MIL/uL — ABNORMAL LOW (ref 4.22–5.81)
RDW: 13.2 % (ref 11.5–15.5)
Smear Review: NORMAL
WBC: 7.4 K/uL (ref 4.0–10.5)
nRBC: 0 % (ref 0.0–0.2)

## 2024-06-29 LAB — COMPREHENSIVE METABOLIC PANEL WITH GFR
ALT: 13 U/L (ref 0–44)
AST: 25 U/L (ref 15–41)
Albumin: 4.6 g/dL (ref 3.5–5.0)
Alkaline Phosphatase: 60 U/L (ref 38–126)
Anion gap: 15 (ref 5–15)
BUN: 25 mg/dL — ABNORMAL HIGH (ref 8–23)
CO2: 27 mmol/L (ref 22–32)
Calcium: 9.5 mg/dL (ref 8.9–10.3)
Chloride: 98 mmol/L (ref 98–111)
Creatinine, Ser: 0.97 mg/dL (ref 0.61–1.24)
GFR, Estimated: >60 mL/min (ref >60)
Glucose, Bld: 132 mg/dL — ABNORMAL HIGH (ref 70–99)
Potassium: 3.8 mmol/L (ref 3.5–5.1)
Sodium: 140 mmol/L (ref 135–145)
Total Bilirubin: 1 mg/dL (ref 0.0–1.2)
Total Protein: 7.3 g/dL (ref 6.5–8.1)

## 2024-06-29 LAB — CBG MONITORING, ED: Glucose-Capillary: 149 mg/dL — ABNORMAL HIGH (ref 70–99)

## 2024-06-29 LAB — TROPONIN T, HIGH SENSITIVITY: Troponin T High Sensitivity: 16 ng/L (ref 0–19)

## 2024-06-29 MED ORDER — SODIUM CHLORIDE 0.9 % IV SOLN: INTRAVENOUS | Status: DC

## 2024-06-29 MED ORDER — ABIRATERONE ACETATE 250 MG PO TABS
1000.0000 mg | ORAL_TABLET | Freq: Every day | ORAL | Status: DC
  Administered 2024-06-30: 1000 mg via ORAL

## 2024-06-29 MED ORDER — STROKE: EARLY STAGES OF RECOVERY BOOK: Freq: Once | Status: AC

## 2024-06-29 MED ORDER — METOPROLOL TARTRATE 25 MG PO TABS
12.5000 mg | ORAL_TABLET | Freq: Two times a day (BID) | ORAL | Status: DC
  Administered 2024-06-29 – 2024-06-30 (×2): 12.5 mg via ORAL
  Filled 2024-06-29 (×2): qty 1

## 2024-06-29 MED ORDER — SENNOSIDES-DOCUSATE SODIUM 8.6-50 MG PO TABS: 1.0000 | ORAL_TABLET | Freq: Every evening | ORAL | Status: DC | PRN

## 2024-06-29 MED ORDER — ADULT MULTIVITAMIN W/MINERALS CH
1.0000 | ORAL_TABLET | Freq: Every day | ORAL | Status: DC
  Administered 2024-06-30: 1 via ORAL
  Filled 2024-06-29: qty 1

## 2024-06-29 MED ORDER — ACETAMINOPHEN 650 MG RE SUPP: 650.0000 mg | RECTAL | Status: DC | PRN

## 2024-06-29 MED ORDER — APIXABAN 5 MG PO TABS
5.0000 mg | ORAL_TABLET | Freq: Two times a day (BID) | ORAL | Status: DC
  Administered 2024-06-29 – 2024-06-30 (×2): 5 mg via ORAL
  Filled 2024-06-29 (×2): qty 1

## 2024-06-29 MED ORDER — ACETAMINOPHEN 325 MG PO TABS: 650.0000 mg | ORAL_TABLET | ORAL | Status: DC | PRN

## 2024-06-29 MED ORDER — HYDROXYUREA 500 MG PO CAPS
500.0000 mg | ORAL_CAPSULE | Freq: Two times a day (BID) | ORAL | Status: DC
  Administered 2024-06-29 – 2024-06-30 (×2): 500 mg via ORAL
  Filled 2024-06-29 (×2): qty 1

## 2024-06-29 MED ORDER — PREDNISONE 5 MG PO TABS
5.0000 mg | ORAL_TABLET | Freq: Every day | ORAL | Status: DC
  Administered 2024-06-30: 5 mg via ORAL
  Filled 2024-06-29: qty 1

## 2024-06-29 MED ORDER — IOHEXOL 350 MG/ML SOLN
75.0000 mL | Freq: Once | INTRAVENOUS | Status: AC | PRN
  Administered 2024-06-29: 75 mL via INTRAVENOUS

## 2024-06-29 MED ORDER — ACETAMINOPHEN 160 MG/5ML PO SOLN: 650.0000 mg | ORAL | Status: DC | PRN

## 2024-06-29 MED ORDER — PAROXETINE HCL 10 MG PO TABS
10.0000 mg | ORAL_TABLET | Freq: Every day | ORAL | Status: DC
  Administered 2024-06-30: 10 mg via ORAL
  Filled 2024-06-29: qty 1

## 2024-06-29 NOTE — Hospital Course (Addendum)
 83 year old male with a history of polycythemia vera, prostate cancer, atrial fibrillation, hypertension, anxiety/depression, hyperlipidemia presenting with dysarthria and facial droop.  The patient was coming up to the kitchen around 8:30 AM on 06/29/2024 when his wife noted him to have some dysarthria.  She had difficulty understanding what he was trying to say.  She stated that it appeared that his left face was drawn up, and that the patient was drooling on the left.  The patient refused for EMS to be activated.  Subsequently, the patient's wife drove him to the emergency department.  Wife states that he was last known normal around 9:30 PM on 06/28/2024.  By time they arrived to emergency department, the patient's deficits have completely resolved.  Apparently, the patient has been having intermittent choking on thin liquids for several months.  He states that happens about once or twice a week.  He had another episode this morning before the above events.  He denies any visual disturbance.  He denies any focal extremity weakness.  He denies any dysesthesias. He denies any fevers, chills, chest pain, shortness breath, cough, hemoptysis, nausea, vomiting, diarrhea, abdominal pain.  He denies any dysuria or hematuria.  There is no hematochezia or melena. In the ED, patient was afebrile hemodynamically stable with oxygen saturation 96% room air.  WBC 7.4, hemoglobin 14.4, platelets 313.  Sodium 140, potassium 3.8, bicarbonate 27, serum creatinine 0.97.  LFTs unremarkable.  MRI of the brain was negative for any acute findings.  CTA head and neck negative for hemodynamically significant stenosis in the neck.  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  The patient was continued on apixaban .  EDP spoke with neurology, Dr. Matthews who recommended admission for stroke workup.  He was admitted for further evaluation and treatment of his stroke.

## 2024-06-29 NOTE — H&P (Signed)
 History and Physical    Patient: Justin Gibbs:[id number] DOB: [birth year] DOA: 06/29/2024 DOS: the patient was seen and examined on 06/29/2024 PCP: Severa Rock HERO, FNP  Patient coming from: Home  Chief Complaint:  Chief Complaint  Patient presents with   Transient Ischemic Attack   HPI: Justin Gibbs is a  83 year old male with a history of polycythemia vera, prostate cancer, atrial fibrillation, hypertension, anxiety/depression, hyperlipidemia presenting with dysarthria and facial droop.  The patient was coming up to the kitchen around 8:30 AM on 06/29/2024 when his wife noted him to have some dysarthria.  She had difficulty understanding what he was trying to say.  She stated that it appeared that his left face was drawn up, and that the patient was drooling on the left.  The patient refused for EMS to be activated.  Subsequently, the patient's wife drove him to the emergency department.  Wife states that he was last known normal around 9:30 PM on 06/28/2024.  By time they arrived to emergency department, the patient's deficits have completely resolved.  Apparently, the patient has been having intermittent choking on thin liquids for several months.  He states that happens about once or twice a week.  He had another episode this morning before the above events.  He denies any visual disturbance.  He denies any focal extremity weakness.  He denies any dysesthesias. He denies any fevers, chills, chest pain, shortness breath, cough, hemoptysis, nausea, vomiting, diarrhea, abdominal pain.  He denies any dysuria or hematuria.  There is no hematochezia or melena. In the ED, patient was afebrile hemodynamically stable with oxygen saturation 96% room air.  WBC 7.4, hemoglobin 14.4, platelets 313.  Sodium 140, potassium 3.8, bicarbonate 27, serum creatinine 0.97.  LFTs unremarkable.  MRI of the brain was negative for any acute findings.  CTA head and neck negative for hemodynamically significant  stenosis in the neck.  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  The patient was continued on apixaban .  EDP spoke with neurology, Dr. Matthews who recommended admission for stroke workup.  He was admitted for further evaluation and treatment of his stroke.   Review of Systems: As mentioned in the history of present illness. All other systems reviewed and are negative. Past Medical History:  Diagnosis Date   Acute ischemic stroke (HCC) 06/29/2024   Anxiety    Arthritis    Atrial fibrillation (HCC) 06/2020   Cancer (HCC)    prostate / removed 2006   Depression    Dysrhythmia    High triglycerides    Hypertension    Polycythemia vera, acquired (HCC)    Prostate cancer [facility])    Past Surgical History:  Procedure Laterality Date   BIOPSY  08/27/2021   Procedure: BIOPSY;  Surgeon: Cindie Carlin POUR, DO;  [facility];  Service: Endoscopy;;   COLONOSCOPY WITH PROPOFOL  N/A 08/27/2021   Procedure: COLONOSCOPY WITH PROPOFOL ;  Surgeon: Cindie Carlin POUR, DO;  [facility];  Service: Endoscopy;  Laterality: N/A;  8:00am   HERNIA REPAIR Bilateral    x 2    POLYPECTOMY  08/27/2021   Procedure: POLYPECTOMY;  Surgeon: Cindie Carlin POUR, DO;  [facility];  Service: Endoscopy;;   PROSTATECTOMY     Social History:  reports that he quit smoking about 42 years ago. His smoking use included cigarettes and cigars. He started smoking about 65 years ago. He has a 23 pack-year smoking history. He  has never used smokeless tobacco. He reports that he does not drink alcohol and does not use drugs.  No Known Allergies  Family History  Problem Relation Age of Onset   Alzheimer's disease Mother    Alcohol abuse Father    Cancer Father        leukemia   Cirrhosis Father    Stomach cancer Father    Thyroid  cancer Sister    Stroke Brother    Prostate cancer Brother    Brain cancer Brother    Diabetes Daughter    Cancer Maternal Grandmother    Cancer  Paternal Grandfather     Prior to Admission medications   Medication Sig Start Date End Date Taking? Authorizing Provider  ciprofloxacin (CIPRO) 500 MG tablet Take 500 mg by mouth 2 (two) times daily. 06/11/24  Yes [provider]  abiraterone  acetate (ZYTIGA ) 250 MG tablet Take 4 tablets (1,000 mg total) by mouth daily. Take on an empty stomach 1 hour before or 2 hours after a meal 12/17/23   Dorsey, John T IV, MD  apixaban  (ELIQUIS ) 5 MG TABS tablet Take 1 tablet (5 mg total) by mouth in the morning and at bedtime. 04/16/24   Severa Rock HERO, FNP  hydroxyurea  (HYDREA ) 500 MG capsule TAKE 1 CAPSULE BY MOUTH TWICE DAILY WITH FOOD TO  MINIMIZE  GI  SIDE  EFFECTS 04/05/24   Gladis Mustard, FNP  lisinopril -hydrochlorothiazide  (ZESTORETIC ) 20-12.5 MG tablet Take 2 tablets by mouth daily. 04/16/24   Severa Rock HERO, FNP  metoprolol  tartrate (LOPRESSOR ) 25 MG tablet Take 1 tablet (25 mg total) by mouth 2 (two) times daily. 04/16/24   Severa Rock HERO, FNP  Multiple Vitamin (MULTIVITAMIN) capsule Take 1 capsule by mouth daily with breakfast.    [provider]  PARoxetine  (PAXIL ) 10 MG tablet Take 1/2 (one-half) tablet by mouth twice daily 04/16/24   Severa Rock HERO, FNP  predniSONE  (DELTASONE ) 5 MG tablet Take 1 tablet (5 mg total) by mouth daily with breakfast. 06/15/24   Federico Norleen ONEIDA MADISON, MD    Physical Exam: Vitals:   06/29/24 0926 06/29/24 0927 06/29/24 0930 06/29/24 0957  BP:  (!) 190/107 (!) 167/104   Pulse:  71 62   Resp:  (!) 25 (!) 27   Temp:      SpO2:  96% 95% 95%  Weight: 104.3 kg     Height: 5' 10 (1.778 m)      GENERAL:  A&O x 3, NAD, well developed, cooperative, follows commands HEENT: [place]/AT, No thrush, No icterus, No oral ulcers Neck:  No neck mass, No meningismus, soft, supple CV: RRR, no S3, no S4, no rub, no JVD Lungs:  CTA, no wheeze, no rhonchi, good air movement Abd: soft/NT +BS, nondistended Ext: No edema, no lymphangitis, no cyanosis, no  rashes Neuro:  CN II-XII intact, strength 4/5 in RUE, RLE, strength 4/5 LUE, LLE; sensation intact bilateral; no dysmetria; babinski equivocal  Data Reviewed: Data reviewed above in the history  Assessment and Plan: Acute ischemic stroke -Appreciate Neurology Consult -PT/OT evaluation -Speech therapy eval -MRI brain--multiple small acute infarcts in the right frontal lobe and right insula -CTA H&N--negative for hemodynamically significant stenosis in the neck.  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 -Echo-- -LDL-- -HbA1C-- -Antiplatelet--apixaban   Essential hypertension - Holding lisinopril /HCTZ to allow for permissive hypertension - Continue metoprolol  at lower dose  Chronic atrial fibrillation - Continue apixaban  - Continue metoprolol  at lower dose to prevent  RVR  Polycythemia vera - Continue Hydrea  - Follow-up hematology, Dr. Norleen Kidney  Prostate cancer, castrate sensitive - Completed radiation therapy 01/31/2021 - Continue Lupron  every 3 months - Continue Zytiga  1 g daily and prednisone  5 mg daily - Follow-up oncology, Dr. Norleen Kidney  Class I obesity - BMI 33.00 - Lifestyle modification    Advance Care Planning: FULL  Consults: neurology  Family Communication: wife 12/2  Severity of Illness: The appropriate patient status for this patient is OBSERVATION. Observation status is judged to be reasonable and necessary in order to provide the required intensity of service to ensure the patient's safety. The patient's presenting symptoms, physical exam findings, and initial radiographic and laboratory data in the context of their medical condition is felt to place them at decreased risk for further clinical deterioration. Furthermore, it is anticipated that the patient will be medically stable for discharge from the hospital within 2 midnights of admission.   Author: Alm Schneider, MD 06/29/2024 12:24 PM  For on call review [email or web address].

## 2024-06-29 NOTE — ED Provider Notes (Signed)
 [place] EMERGENCY DEPARTMENT AT [facility] Provider Note   CSN: [id number] Arrival date & time: 06/29/24  9085     Patient presents with: Transient Ischemic Attack   Justin Gibbs is a 83 y.o. male.   Patient is an 83 year old male who presents to the emergency department with a chief complaint of possible TIA versus CVA.  Patient and family note that this morning he developed right sided facial droop as well as expressive aphasia.  Upon arrival to the emergency department all symptoms have completely resolved.  Patient notes that he had no associated unilateral weakness, dizziness, lightheadedness.  He denied any numbness or paresthesias.  Patient denies any history of CVA or TIA but notes he does have a history of atrial fibrillation which he is currently anticoagulated on Eliquis  for.  He notes that he has had no chest pain, shortness of breath, palpitations.  He denies any abdominal pain, nausea, vomiting, diarrhea.  Patient notes that he was drinking some water  prior to onset of symptoms and felt like he choked on it and then this resolved.        Prior to Admission medications   Medication Sig Start Date End Date Taking? Authorizing Provider  abiraterone  acetate (ZYTIGA ) 250 MG tablet Take 4 tablets (1,000 mg total) by mouth daily. Take on an empty stomach 1 hour before or 2 hours after a meal 12/17/23   Dorsey, John T IV, MD  apixaban  (ELIQUIS ) 5 MG TABS tablet Take 1 tablet (5 mg total) by mouth in the morning and at bedtime. 04/16/24   Severa Rock HERO, FNP  hydroxyurea  (HYDREA ) 500 MG capsule TAKE 1 CAPSULE BY MOUTH TWICE DAILY WITH FOOD TO  MINIMIZE  GI  SIDE  EFFECTS 04/05/24   Gladis Mustard, FNP  lisinopril -hydrochlorothiazide  (ZESTORETIC ) 20-12.5 MG tablet Take 2 tablets by mouth daily. 04/16/24   Severa Rock HERO, FNP  metoprolol  tartrate (LOPRESSOR ) 25 MG tablet Take 1 tablet (25 mg total) by mouth 2 (two) times daily. 04/16/24   Severa Rock HERO, FNP  Multiple  Vitamin (MULTIVITAMIN) capsule Take 1 capsule by mouth daily with breakfast.    [provider]  PARoxetine  (PAXIL ) 10 MG tablet Take 1/2 (one-half) tablet by mouth twice daily 04/16/24   Rakes, Rock HERO, FNP  predniSONE  (DELTASONE ) 5 MG tablet Take 1 tablet (5 mg total) by mouth daily with breakfast. 06/15/24   Dorsey, John T IV, MD    Allergies: Patient has no known allergies.    Review of Systems  Neurological:  Positive for speech difficulty.  All other systems reviewed and are negative.   Updated Vital Signs BP (!) 167/104   Pulse 62   Temp 97.7 F (36.5 C)   Resp (!) 27   Ht 5' 10 (1.778 m)   Wt 104.3 kg   SpO2 95%   BMI 33.00 kg/m   Physical Exam Vitals and nursing note reviewed.  Constitutional:      General: He is not in acute distress.    Appearance: Normal appearance. He is not ill-appearing.  HENT:     Head: Normocephalic and atraumatic.     Nose: Nose normal.     Mouth/Throat:     Mouth: Mucous membranes are moist.  Eyes:     Extraocular Movements: Extraocular movements intact.     Conjunctiva/sclera: Conjunctivae normal.     Pupils: Pupils are equal, round, and reactive to light.  Cardiovascular:     Rate and Rhythm: Normal rate and regular  rhythm.     Pulses: Normal pulses.     Heart sounds: Normal heart sounds. No murmur heard.    No gallop.  Pulmonary:     Effort: Pulmonary effort is normal. No respiratory distress.     Breath sounds: Normal breath sounds. No stridor. No wheezing, rhonchi or rales.  Abdominal:     General: Abdomen is flat. Bowel sounds are normal. There is no distension.     Palpations: Abdomen is soft.     Tenderness: There is no abdominal tenderness. There is no guarding.  Musculoskeletal:        General: Normal range of motion.     Cervical back: Normal range of motion and neck supple. No rigidity or tenderness.     Right lower leg: No edema.     Left lower leg: No edema.  Skin:    General: Skin is warm and dry.      Findings: No bruising or rash.  Neurological:     General: No focal deficit present.     Mental Status: He is alert and oriented to person, place, and time. Mental status is at baseline.     Cranial Nerves: No cranial nerve deficit.     Sensory: No sensory deficit.     Motor: No weakness.     Coordination: Coordination normal.     Gait: Gait normal.     Comments: Normal finger-nose, normal rapid alternating movements, normal heel-to-shin, no facial droop, speech clear  Psychiatric:        Mood and Affect: Mood normal.        Behavior: Behavior normal.        Thought Content: Thought content normal.        Judgment: Judgment normal.     (all labs ordered are listed, but only abnormal results are displayed) Labs Reviewed  CBG MONITORING, ED - Abnormal; Notable for the following components:      Result Value   Glucose-Capillary 149 (*)    All other components within normal limits  COMPREHENSIVE METABOLIC PANEL WITH GFR  CBC WITH DIFFERENTIAL/PLATELET  PROTIME-INR  TROPONIN T, HIGH SENSITIVITY    EKG: None  Radiology: No results found.   Procedures   Medications Ordered in the ED - No data to display                                  Medical Decision Making Amount and/or Complexity of Data Reviewed Labs: ordered. Radiology: ordered.  Risk Prescription drug management. Decision regarding hospitalization.   This patient presents to the ED for concern of facial droop, expressive aphasia, resolved on presentation, this involves an extensive number of treatment options, and is a complaint that carries with it a high risk of complications and morbidity.  The differential diagnosis includes CVA, TIA, space-occupying lesion, electrolyte derangement, acute kidney injury, sepsis   Co morbidities that complicate the patient evaluation  Atrial fibrillation   Additional history obtained:  Additional history obtained from family External records from outside source  obtained and reviewed including medical records   Lab Tests:  I Ordered, and personally interpreted labs.  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  I personally viewed and interpreted the cardiac monitored which showed an underlying rhythm of: Atrial fibrillation, no ST/T wave changes, no ischemic changes, no STEMI   Consultations Obtained:  I requested consultation with the Dr. Matthews, neurology,  and discussed lab and imaging findings as well as pertinent plan - they recommend: Admission, continue Eliquis , consult with inpatient neurology   Problem List / ED Course / Critical interventions / Medication management  Patient does remain stable at this time.  He continues to be asymptomatic at this point with no acute neurological deficits.  He has no aphasia or facial droop at this point.  He has no unilateral weakness.  He has no coordination changes.  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  Did discuss patient case with Dr. Matthews with neurology who did recommend admission, continue Eliquis , consult with inpatient neurology.  Patient did not warrant stroke alert as he was asymptomatic on presentation.  Have discussed patient case with Dr. Evonnie with the hospitalist who has excepted for admission.    Social Determinants of Health:  None   Test / Admission - Considered:  Admission     Final diagnoses:  None    ED Discharge Orders     None          Daralene Lonni JONETTA DEVONNA 06/29/24 1157    Long, Fonda MATSU, MD 07/08/24 1517
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
## 2024-06-30 ENCOUNTER — Observation Stay (HOSPITAL_COMMUNITY)

## 2024-06-30 ENCOUNTER — Other Ambulatory Visit: Payer: Self-pay | Admitting: Neurology

## 2024-06-30 DIAGNOSIS — I639 Cerebral infarction, unspecified: Secondary | ICD-10-CM | POA: Diagnosis not present

## 2024-06-30 DIAGNOSIS — I10 Essential (primary) hypertension: Secondary | ICD-10-CM | POA: Diagnosis not present

## 2024-06-30 DIAGNOSIS — I4891 Unspecified atrial fibrillation: Secondary | ICD-10-CM | POA: Diagnosis not present

## 2024-06-30 LAB — BASIC METABOLIC PANEL WITH GFR
Anion gap: 8 (ref 5–15)
BUN: 18 mg/dL (ref 8–23)
CO2: 32 mmol/L (ref 22–32)
Calcium: 8.7 mg/dL — ABNORMAL LOW (ref 8.9–10.3)
Chloride: 101 mmol/L (ref 98–111)
Creatinine, Ser: 0.86 mg/dL (ref 0.61–1.24)
GFR, Estimated: >60 mL/min (ref >60)
Glucose, Bld: 95 mg/dL (ref 70–99)
Potassium: 3.5 mmol/L (ref 3.5–5.1)
Sodium: 141 mmol/L (ref 135–145)

## 2024-06-30 LAB — ECHOCARDIOGRAM COMPLETE
Area-P 1/2: 3.93 cm2
Calc EF: 56.9 %
Height: 70 in
Single Plane A2C EF: 62.8 %
Single Plane A4C EF: 52 %
Weight: 3680 [oz_av]

## 2024-06-30 LAB — LIPID PANEL
Cholesterol: 148 mg/dL (ref 0–200)
HDL: 43 mg/dL (ref >40)
LDL Cholesterol: 81 mg/dL (ref 0–99)
Total CHOL/HDL Ratio: 3.5 ratio
Triglycerides: 123 mg/dL (ref <150)
VLDL: 25 mg/dL (ref 0–40)

## 2024-06-30 MED ORDER — SENNOSIDES-DOCUSATE SODIUM 8.6-50 MG PO TABS: 1.0000 | ORAL_TABLET | Freq: Two times a day (BID) | ORAL | Status: DC | PRN

## 2024-06-30 MED ORDER — PERFLUTREN LIPID MICROSPHERE
1.0000 mL | INTRAVENOUS | Status: AC | PRN
  Administered 2024-06-30: 3 mL via INTRAVENOUS

## 2024-06-30 NOTE — Progress Notes (Signed)
 Echocardiogram 2D Echocardiogram has been performed.  Justin Gibbs 06/30/2024, 9:55 AM

## 2024-06-30 NOTE — Care Management Obs Status (Signed)
 MEDICARE OBSERVATION STATUS NOTIFICATION   Patient Details  Name: Justin Gibbs MRN: [id number] Date of Birth: 1941/05/07   Medicare Observation Status Notification Given:  Yes    Justin Gibbs Ada 06/30/2024, 2:24 PM

## 2024-06-30 NOTE — Plan of Care (Signed)
  Problem: Education: Goal: Knowledge of General Education information will improve Description: Including pain rating scale, medication(s)/side effects and non-pharmacologic comfort measures Outcome: Progressing   Problem: Health Behavior/Discharge Planning: Goal: Ability to manage health-related needs will improve Outcome: Progressing   Problem: Clinical Measurements: Goal: Ability to maintain clinical measurements within normal limits will improve Outcome: Progressing Goal: Will remain free from infection Outcome: Progressing Goal: Diagnostic test results will improve Outcome: Progressing Goal: Respiratory complications will improve Outcome: Progressing Goal: Cardiovascular complication will be avoided Outcome: Progressing   Problem: Activity: Goal: Risk for activity intolerance will decrease Outcome: Progressing   Problem: Nutrition: Goal: Adequate nutrition will be maintained Outcome: Progressing   Problem: Coping: Goal: Level of anxiety will decrease Outcome: Progressing   Problem: Elimination: Goal: Will not experience complications related to bowel motility Outcome: Progressing Goal: Will not experience complications related to urinary retention Outcome: Progressing   Problem: Pain Managment: Goal: General experience of comfort will improve and/or be controlled Outcome: Progressing   Problem: Safety: Goal: Ability to remain free from injury will improve Outcome: Progressing   Problem: Skin Integrity: Goal: Risk for impaired skin integrity will decrease Outcome: Progressing   Problem: Education: Goal: Knowledge of disease or condition will improve Outcome: Progressing Goal: Knowledge of secondary prevention will improve (MUST DOCUMENT ALL) Outcome: Progressing Goal: Knowledge of patient specific risk factors will improve (DELETE if not current risk factor) Outcome: Progressing   Problem: Ischemic Stroke/TIA Tissue Perfusion: Goal: Complications of  ischemic stroke/TIA will be minimized Outcome: Progressing   Problem: Coping: Goal: Will verbalize positive feelings about self Outcome: Progressing Goal: Will identify appropriate support needs Outcome: Progressing   Problem: Health Behavior/Discharge Planning: Goal: Ability to manage health-related needs will improve Outcome: Progressing Goal: Goals will be collaboratively established with patient/family Outcome: Progressing   Problem: Self-Care: Goal: Ability to participate in self-care as condition permits will improve Outcome: Progressing Goal: Verbalization of feelings and concerns over difficulty with self-care will improve Outcome: Progressing Goal: Ability to communicate needs accurately will improve Outcome: Progressing   Problem: Nutrition: Goal: Risk of aspiration will decrease Outcome: Progressing Goal: Dietary intake will improve Outcome: Progressing   Problem: Education: Goal: Knowledge of disease or condition will improve Outcome: Progressing Goal: Knowledge of secondary prevention will improve (MUST DOCUMENT ALL) Outcome: Progressing Goal: Knowledge of patient specific risk factors will improve (DELETE if not current risk factor) Outcome: Progressing   Problem: Ischemic Stroke/TIA Tissue Perfusion: Goal: Complications of ischemic stroke/TIA will be minimized Outcome: Progressing   Problem: Coping: Goal: Will verbalize positive feelings about self Outcome: Progressing Goal: Will identify appropriate support needs Outcome: Progressing   Problem: Health Behavior/Discharge Planning: Goal: Ability to manage health-related needs will improve Outcome: Progressing Goal: Goals will be collaboratively established with patient/family Outcome: Progressing   Problem: Self-Care: Goal: Ability to participate in self-care as condition permits will improve Outcome: Progressing Goal: Verbalization of feelings and concerns over difficulty with self-care will  improve Outcome: Progressing Goal: Ability to communicate needs accurately will improve Outcome: Progressing   Problem: Nutrition: Goal: Risk of aspiration will decrease Outcome: Progressing Goal: Dietary intake will improve Outcome: Progressing

## 2024-06-30 NOTE — Plan of Care (Signed)
 Problem: Education: Goal: Knowledge of General Education information will improve Description: Including pain rating scale, medication(s)/side effects and non-pharmacologic comfort measures 06/30/2024 0538 by Halbert Eleanor HERO, RN Outcome: Progressing 06/30/2024 0536 by Halbert Eleanor HERO, RN Outcome: Progressing   Problem: Health Behavior/Discharge Planning: Goal: Ability to manage health-related needs will improve 06/30/2024 0538 by Halbert Eleanor HERO, RN Outcome: Progressing 06/30/2024 0536 by Halbert Eleanor HERO, RN Outcome: Progressing   Problem: Clinical Measurements: Goal: Ability to maintain clinical measurements within normal limits will improve 06/30/2024 0538 by Halbert Eleanor HERO, RN Outcome: Progressing 06/30/2024 0536 by Halbert Eleanor HERO, RN Outcome: Progressing Goal: Will remain free from infection 06/30/2024 0538 by Halbert Eleanor HERO, RN Outcome: Progressing 06/30/2024 0536 by Halbert Eleanor HERO, RN Outcome: Progressing Goal: Diagnostic test results will improve 06/30/2024 0538 by Halbert Eleanor HERO, RN Outcome: Progressing 06/30/2024 0536 by Halbert Eleanor HERO, RN Outcome: Progressing Goal: Respiratory complications will improve 06/30/2024 0538 by Halbert Eleanor HERO, RN Outcome: Progressing 06/30/2024 0536 by Halbert Eleanor HERO, RN Outcome: Progressing Goal: Cardiovascular complication will be avoided 06/30/2024 0538 by Halbert Eleanor HERO, RN Outcome: Progressing 06/30/2024 0536 by Halbert Eleanor HERO, RN Outcome: Progressing   Problem: Activity: Goal: Risk for activity intolerance will decrease 06/30/2024 0538 by Halbert Eleanor HERO, RN Outcome: Progressing 06/30/2024 0536 by Halbert Eleanor HERO, RN Outcome: Progressing   Problem: Nutrition: Goal: Adequate nutrition will be maintained 06/30/2024 0538 by Halbert Eleanor HERO, RN Outcome: Progressing 06/30/2024 0536 by Halbert Eleanor HERO, RN Outcome: Progressing   Problem: Coping: Goal: Level of anxiety will  decrease 06/30/2024 0538 by Halbert Eleanor HERO, RN Outcome: Progressing 06/30/2024 0536 by Halbert Eleanor HERO, RN Outcome: Progressing   Problem: Elimination: Goal: Will not experience complications related to bowel motility 06/30/2024 0538 by Halbert Eleanor HERO, RN Outcome: Progressing 06/30/2024 0536 by Halbert Eleanor HERO, RN Outcome: Progressing Goal: Will not experience complications related to urinary retention 06/30/2024 0538 by Halbert Eleanor HERO, RN Outcome: Progressing 06/30/2024 0536 by Halbert Eleanor HERO, RN Outcome: Progressing   Problem: Pain Managment: Goal: General experience of comfort will improve and/or be controlled 06/30/2024 0538 by Halbert Eleanor HERO, RN Outcome: Progressing 06/30/2024 0536 by Halbert Eleanor HERO, RN Outcome: Progressing   Problem: Safety: Goal: Ability to remain free from injury will improve 06/30/2024 0538 by Halbert Eleanor HERO, RN Outcome: Progressing 06/30/2024 0536 by Halbert Eleanor HERO, RN Outcome: Progressing   Problem: Skin Integrity: Goal: Risk for impaired skin integrity will decrease 06/30/2024 0538 by Halbert Eleanor HERO, RN Outcome: Progressing 06/30/2024 0536 by Halbert Eleanor HERO, RN Outcome: Progressing   Problem: Education: Goal: Knowledge of disease or condition will improve 06/30/2024 0538 by Halbert Eleanor HERO, RN Outcome: Progressing 06/30/2024 0536 by Halbert Eleanor HERO, RN Outcome: Progressing Goal: Knowledge of secondary prevention will improve (MUST DOCUMENT ALL) 06/30/2024 0538 by Halbert Eleanor HERO, RN Outcome: Progressing 06/30/2024 0536 by Halbert Eleanor HERO, RN Outcome: Progressing Goal: Knowledge of patient specific risk factors will improve (DELETE if not current risk factor) 06/30/2024 0538 by Halbert Eleanor HERO, RN Outcome: Progressing 06/30/2024 0536 by Halbert Eleanor HERO, RN Outcome: Progressing   Problem: Ischemic Stroke/TIA Tissue Perfusion: Goal: Complications of ischemic stroke/TIA will be  minimized 06/30/2024 0538 by Halbert Eleanor HERO, RN Outcome: Progressing 06/30/2024 0536 by Halbert Eleanor HERO, RN Outcome: Progressing   Problem: Coping: Goal: Will verbalize positive feelings about self 06/30/2024 0538 by Halbert Eleanor HERO, RN Outcome: Progressing 06/30/2024 0536 by Halbert Eleanor HERO, RN Outcome: Progressing Goal: Will identify appropriate support needs 06/30/2024  [id number] by Halbert Eleanor HERO, RN Outcome: Progressing 06/30/2024 0536 by Halbert Eleanor HERO, RN Outcome: Progressing   Problem: Health Behavior/Discharge Planning: Goal: Ability to manage health-related needs will improve 06/30/2024 0538 by Halbert Eleanor HERO, RN Outcome: Progressing 06/30/2024 0536 by Halbert Eleanor HERO, RN Outcome: Progressing Goal: Goals will be collaboratively established with patient/family 06/30/2024 0538 by Halbert Eleanor HERO, RN Outcome: Progressing 06/30/2024 0536 by Halbert Eleanor HERO, RN Outcome: Progressing   Problem: Self-Care: Goal: Ability to participate in self-care as condition permits will improve 06/30/2024 0538 by Halbert Eleanor HERO, RN Outcome: Progressing 06/30/2024 0536 by Halbert Eleanor HERO, RN Outcome: Progressing Goal: Verbalization of feelings and concerns over difficulty with self-care will improve 06/30/2024 0538 by Halbert Eleanor HERO, RN Outcome: Progressing 06/30/2024 0536 by Halbert Eleanor HERO, RN Outcome: Progressing Goal: Ability to communicate needs accurately will improve 06/30/2024 0538 by Halbert Eleanor HERO, RN Outcome: Progressing 06/30/2024 0536 by Halbert Eleanor HERO, RN Outcome: Progressing   Problem: Nutrition: Goal: Risk of aspiration will decrease 06/30/2024 0538 by Halbert Eleanor HERO, RN Outcome: Progressing 06/30/2024 0536 by Halbert Eleanor HERO, RN Outcome: Progressing Goal: Dietary intake will improve 06/30/2024 0538 by Halbert Eleanor HERO, RN Outcome: Progressing 06/30/2024 0536 by Halbert Eleanor HERO, RN Outcome: Progressing   Problem:  Education: Goal: Knowledge of disease or condition will improve 06/30/2024 0538 by Halbert Eleanor HERO, RN Outcome: Progressing 06/30/2024 0536 by Halbert Eleanor HERO, RN Outcome: Progressing Goal: Knowledge of secondary prevention will improve (MUST DOCUMENT ALL) 06/30/2024 0538 by Halbert Eleanor HERO, RN Outcome: Progressing 06/30/2024 0536 by Halbert Eleanor HERO, RN Outcome: Progressing Goal: Knowledge of patient specific risk factors will improve (DELETE if not current risk factor) 06/30/2024 0538 by Halbert Eleanor HERO, RN Outcome: Progressing 06/30/2024 0536 by Halbert Eleanor HERO, RN Outcome: Progressing   Problem: Ischemic Stroke/TIA Tissue Perfusion: Goal: Complications of ischemic stroke/TIA will be minimized 06/30/2024 0538 by Halbert Eleanor HERO, RN Outcome: Progressing 06/30/2024 0536 by Halbert Eleanor HERO, RN Outcome: Progressing   Problem: Coping: Goal: Will verbalize positive feelings about self 06/30/2024 0538 by Halbert Eleanor HERO, RN Outcome: Progressing 06/30/2024 0536 by Halbert Eleanor HERO, RN Outcome: Progressing Goal: Will identify appropriate support needs 06/30/2024 0538 by Halbert Eleanor HERO, RN Outcome: Progressing 06/30/2024 0536 by Halbert Eleanor HERO, RN Outcome: Progressing   Problem: Health Behavior/Discharge Planning: Goal: Ability to manage health-related needs will improve 06/30/2024 0538 by Halbert Eleanor HERO, RN Outcome: Progressing 06/30/2024 0536 by Halbert Eleanor HERO, RN Outcome: Progressing Goal: Goals will be collaboratively established with patient/family 06/30/2024 0538 by Halbert Eleanor HERO, RN Outcome: Progressing 06/30/2024 0536 by Halbert Eleanor HERO, RN Outcome: Progressing   Problem: Self-Care: Goal: Ability to participate in self-care as condition permits will improve 06/30/2024 0538 by Halbert Eleanor HERO, RN Outcome: Progressing 06/30/2024 0536 by Halbert Eleanor HERO, RN Outcome: Progressing Goal: Verbalization of feelings and concerns  over difficulty with self-care will improve 06/30/2024 0538 by Halbert Eleanor HERO, RN Outcome: Progressing 06/30/2024 0536 by Halbert Eleanor HERO, RN Outcome: Progressing Goal: Ability to communicate needs accurately will improve 06/30/2024 0538 by Halbert Eleanor HERO, RN Outcome: Progressing 06/30/2024 0536 by Halbert Eleanor HERO, RN Outcome: Progressing   Problem: Nutrition: Goal: Risk of aspiration will decrease 06/30/2024 0538 by Halbert Eleanor HERO, RN Outcome: Progressing 06/30/2024 0536 by Halbert Eleanor HERO, RN Outcome: Progressing Goal: Dietary intake will improve 06/30/2024 0538 by Halbert Eleanor HERO, RN Outcome: Progressing 06/30/2024 0536 by Halbert Eleanor HERO, RN Outcome: Progressing

## 2024-06-30 NOTE — Plan of Care (Signed)
  Problem: Acute Rehab PT Goals(only PT should resolve) Goal: Pt Will Go Sit To Supine/Side Outcome: Progressing Flowsheets (Taken 06/30/2024 1403) Pt will go Sit to Supine/Side: Independently Goal: Patient Will Perform Sitting Balance Outcome: Progressing Flowsheets (Taken 06/30/2024 1403) Patient will perform sitting balance:  Independently  with no UE support Goal: Patient Will Transfer Sit To/From Stand Outcome: Progressing Flowsheets (Taken 06/30/2024 1403) Patient will transfer sit to/from stand: Independently Goal: Pt Will Transfer Bed To Chair/Chair To Bed Outcome: Progressing Flowsheets (Taken 06/30/2024 1403) Pt will Transfer Bed to Chair/Chair to Bed: Independently Goal: Pt Will Perform Standing Balance Or Pre-Gait Outcome: Progressing Flowsheets (Taken 06/30/2024 1403) Pt will perform standing balance or pre-gait:  Independently  with Modified Independent  with no UE support Goal: Pt Will Ambulate Outcome: Progressing Flowsheets (Taken 06/30/2024 1403) Pt will Ambulate:  75 feet  50 feet  with least restrictive assistive device Note: Quad cane     [employer], SPT

## 2024-06-30 NOTE — Evaluation (Signed)
 Occupational Therapy Evaluation Patient Details Name: Justin Gibbs MRN: [id number] DOB: 1940/10/28 Today's Date: 06/30/2024   History of Present Illness   BURNHAM TROST is a  83 year old male with a history of polycythemia vera, prostate cancer, atrial fibrillation, hypertension, anxiety/depression, hyperlipidemia presenting with dysarthria and facial droop.  The patient was coming up to the kitchen around 8:30 AM on 06/29/2024 when his wife noted him to have some dysarthria.  She had difficulty understanding what he was trying to say.  She stated that it appeared that his left face was drawn up, and that the patient was drooling on the left.  The patient refused for EMS to be activated.  Subsequently, the patient's wife drove him to the emergency department.  Wife states that he was last known normal around 9:30 PM on 06/28/2024.  By time they arrived to emergency department, the patient's deficits have completely resolved.  Apparently, the patient has been having intermittent choking on thin liquids for several months.  He states that happens about once or twice a week.  He had another episode this morning before the above events.  He denies any visual disturbance.  He denies any focal extremity weakness.  He denies any dysesthesias.  He denies any fevers, chills, chest pain, shortness breath, cough, hemoptysis, nausea, vomiting, diarrhea, abdominal pain.  He denies any dysuria or hematuria.  There is no hematochezia or melena.  In the ED, patient was afebrile hemodynamically stable with oxygen saturation 96% room air.  WBC 7.4, hemoglobin 14.4, platelets 313.  Sodium 140, potassium 3.8, bicarbonate 27, serum creatinine 0.97.  LFTs unremarkable.  MRI of the brain was negative for any acute findings.  CTA head and neck negative for hemodynamically significant stenosis in the neck.  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  The patient was continued on apixaban .  EDP spoke with  neurology, Dr. Matthews who recommended admission for stroke workup.  He was admitted for further evaluation and treatment of his stroke. (per MD)     Clinical Impressions Pt agreeable to OT and PT co-evaluation. Pt likely near baseline function, but does demonstrate B UE weakness that pt reports has been normal for him. Despite limited A/ROM pt has full P/ROM for shoulder flexion, indicating weakness being the primary reason for loss of range. Supervision to mod I assist for ambulation with quad cane. No physical assist needed for ADL's. Pt has history of multiple falls at home. Pt left in the bed  with call bell within reach. Pt will benefit from continued OT in the hospital to increase strength, balance, and endurance for safe ADL's.        If plan is discharge home, recommend the following:   A little help with walking and/or transfers;Assistance with cooking/housework;Help with stairs or ramp for entrance     Functional Status Assessment   Patient has not had a recent decline in their functional status     Equipment Recommendations   None recommended by OT             Precautions/Restrictions   Precautions Precautions: Fall Recall of Precautions/Restrictions: Intact Restrictions Weight Bearing Restrictions Per Provider Order: No     Mobility Bed Mobility Overal bed mobility: Independent                  Transfers Overall transfer level: Modified independent Equipment used: Quad cane  General transfer comment: Mod I to supervision for transfers with quad cane.      Balance Overall balance assessment: Needs assistance Sitting-balance support: No upper extremity supported, Feet supported Sitting balance-Leahy Scale: Fair Sitting balance - Comments: fair to good at EOB   Standing balance support: Single extremity supported, During functional activity Standing balance-Leahy Scale: Fair Standing balance comment: using quad cane                            ADL either performed or assessed with clinical judgement   ADL Overall ADL's : Needs assistance/impaired     Grooming: Supervision/safety;Standing;Modified independent   Upper Body Bathing: Modified independent;Sitting   Lower Body Bathing: Modified independent;Sitting/lateral leans   Upper Body Dressing : Modified independent;Sitting;Independent   Lower Body Dressing: Modified independent;Sitting/lateral leans Lower Body Dressing Details (indicate cue type and reason): labored effor to doff and don sock at EOB. Toilet Transfer: Supervision/safety;Ambulation;Modified Independent (quad cane) Toilet Transfer Details (indicate cue type and reason): Simualted via ambulation in the room/hall Toileting- Clothing Manipulation and Hygiene: Modified independent Toileting - Clothing Manipulation Details (indicate cue type and reason): sears briefs at baseline     Functional mobility during ADLs: Supervision/safety;Modified independent;Cane       Vision Baseline Vision/History: 0 No visual deficits Ability to See in Adequate Light: 0 Adequate Patient Visual Report: No change from baseline Vision Assessment?: No apparent visual deficits     Perception Perception: Not tested       Praxis Praxis: Not tested       Pertinent Vitals/Pain Pain Assessment Pain Assessment: No/denies pain     Extremity/Trunk Assessment Upper Extremity Assessment Upper Extremity Assessment:  (B UE 3-/5 shoulder flexion, which pt reports has been his normal. Pt demonstrated full P/ROM for shoulder flexion bilaterally. No other significant weakness noted WFL sensation and coordination.)   Lower Extremity Assessment Lower Extremity Assessment: Defer to PT evaluation   Cervical / Trunk Assessment Cervical / Trunk Assessment: Kyphotic   Communication Communication Communication: Impaired Factors Affecting Communication: Hearing impaired   Cognition Arousal:  Alert Behavior During Therapy: WFL for tasks assessed/performed Cognition: No apparent impairments                               Following commands: Intact       Cueing  General Comments   Cueing Techniques: Verbal cues;Visual cues                 Home Living Family/patient expects to be discharged to:: Private residence Living Arrangements: Spouse/significant other Available Help at Discharge: Family;Available 24 hours/day Type of Home: House Home Access: Stairs to enter [employer] of Steps: 4 Entrance Stairs-Rails: None Home Layout: Laundry or work area in basement;Able to live on main level with bedroom/bathroom     Bathroom Shower/Tub: Producer, Television/film/video: Handicapped height Bathroom Accessibility: Yes How Accessible: Accessible via walker Home Equipment: Cane - single point;Grab bars - tub/shower          Prior Functioning/Environment Prior Level of Function : Independent/Modified Independent             Mobility Comments: Community ambulator with [employer]; uses scooter in the grocery store. ADLs Comments: Independent ADL's; wife does most IADL's.    OT Problem List: Decreased strength;Decreased range of motion;Decreased activity tolerance;Impaired balance (sitting and/or standing)   OT Treatment/Interventions: Self-care/ADL training;Therapeutic exercise;Therapeutic activities;Patient/family education;Balance  training      OT Goals(Current goals can be found in the care plan section)   Acute Rehab OT Goals Patient Stated Goal: return home OT Goal Formulation: With patient/family Time For Goal Achievement: 07/14/24 Potential to Achieve Goals: Good   OT Frequency:  Min 1X/week    Co-evaluation PT/OT/SLP Co-Evaluation/Treatment: Yes Reason for Co-Treatment: To address functional/ADL transfers   OT goals addressed during session: ADL's and self-care      AM-PAC OT 6 Clicks Daily Activity     Outcome  Measure Help from another person eating meals?: None Help from another person taking care of personal grooming?: None Help from another person toileting, which includes using toliet, bedpan, or urinal?: None Help from another person bathing (including washing, rinsing, drying)?: None Help from another person to put on and taking off regular upper body clothing?: None Help from another person to put on and taking off regular lower body clothing?: None 6 Click Score: 24   End of Session Equipment Utilized During Treatment: Other (comment) (quad cane)  Activity Tolerance: Patient tolerated treatment well Patient left: in bed;with call bell/phone within reach;with family/visitor present  OT Visit Diagnosis: Unsteadiness on feet (R26.81);Other abnormalities of gait and mobility (R26.89);Muscle weakness (generalized) (M62.81);Other symptoms and signs involving the nervous system (R29.898)                Time: 8965-8944 OT Time Calculation (min): 21 min Charges:  OT General Charges $OT Visit: 1 Visit OT Evaluation $OT Eval Low Complexity: 1 Low  Nisaiah Bechtol OT, MOT  Jayson Person 06/30/2024, 11:49 AM

## 2024-06-30 NOTE — Plan of Care (Signed)
  Problem: Acute Rehab OT Goals (only OT should resolve) Goal: Pt/Caregiver Will Perform Home Exercise Program Flowsheets (Taken 06/30/2024 1152) Pt/caregiver will Perform Home Exercise Program:  Increased strength  Increased ROM  Both right and left upper extremity  Independently  Filomeno Cromley OT, MOT

## 2024-06-30 NOTE — Progress Notes (Signed)
 Echo attempted, pt experiencing BM  [facility] 8:47 AM 06/30/2024

## 2024-06-30 NOTE — Discharge Summary (Signed)
 Physician Discharge Summary   Patient: Justin Gibbs MRN: [id number] DOB: 15-Oct-1940  Admit date:     06/29/2024  Discharge date: 06/30/2024  Discharge Physician: Bernardino KATHEE Come   PCP: Severa Rock HERO, FNP   Recommendations at discharge:  Follow up with cardiology after discharge, consider Watchman workup/candidacy due to embolic stroke and intermittent hematuria limiting anticoagulation.  Follow up with neurology after discharge for post stroke care.   Discharge Diagnoses: Principal Problem:   Acute ischemic stroke [facility]) Active Problems:   Mixed hyperlipidemia   Essential hypertension   Polycythemia vera (HCC)   Chronic atrial fibrillation with RVR (HCC)   Obesity (BMI 30-39.9)  Resolved Problems:   * No resolved hospital problems. *  Hospital Course: 83 year old male with a history of polycythemia vera, prostate cancer, atrial fibrillation, hypertension, anxiety/depression, hyperlipidemia presenting with dysarthria and facial droop.  The patient was coming up to the kitchen around 8:30 AM on 06/29/2024 when his wife noted him to have some dysarthria.  She had difficulty understanding what he was trying to say.  She stated that it appeared that his left face was drawn up, and that the patient was drooling on the left.  The patient refused for EMS to be activated.  Subsequently, the patient's wife drove him to the emergency department.  Wife states that he was last known normal around 9:30 PM on 06/28/2024.  By time they arrived to emergency department, the patient's deficits have completely resolved.  Apparently, the patient has been having intermittent choking on thin liquids for several months.  He states that happens about once or twice a week.  He had another episode this morning before the above events.  He denies any visual disturbance.  He denies any focal extremity weakness.  He denies any dysesthesias. He denies any fevers, chills, chest pain, shortness breath, cough, hemoptysis,  nausea, vomiting, diarrhea, abdominal pain.  He denies any dysuria or hematuria.  There is no hematochezia or melena. In the ED, patient was afebrile hemodynamically stable with oxygen saturation 96% room air.  WBC 7.4, hemoglobin 14.4, platelets 313.  Sodium 140, potassium 3.8, bicarbonate 27, serum creatinine 0.97.  LFTs unremarkable.  MRI of the brain was negative for any acute findings.  CTA head and neck negative for hemodynamically significant stenosis in the neck.  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  The patient was continued on apixaban .  EDP spoke with neurology, Dr. Matthews who recommended admission for stroke workup.  He was admitted for further evaluation and treatment of his stroke.   Assessment and Plan: 83 year old male with history of A-fib on Eliquis  who presented with transient dysarthria and facial droop which has since resolved.   Acute ischemic stroke - Etiology: Likely cardioembolic   Recommendations: - Patient stroke is relatively small.  He also has occlusion in right M2 which is relatively distal.  Additionally patient's NIHSS is 0 and therefore he will not be a candidate for thrombectomy - Will continue Eliquis  5 mg twice daily - Patient reported he occasionally has to hold Eliquis  due to hematuria secondary to his radiation for prostate cancer. Message sent to Dr. Lavona to re-address Watchman at follow up. - Goal blood pressure: Permissive hypertension today followed by gradual normotension over next 2 weeks.  Recommend avoiding hypotension as patient still has intracranial occlusion and hypoperfusion can worsen stroke - Follow-up with neurology (order placed) - Stroke education  Consultants: Neurology Procedures performed: None  Disposition: Home Diet  recommendation:  Discharge Diet Orders (From admission, onward)     Start     Ordered   06/30/24 0000  Diet - low sodium heart healthy        06/30/24 1412           DISCHARGE  MEDICATION: Allergies as of 06/30/2024   No Known Allergies      Medication List     STOP taking these medications    ciprofloxacin 500 MG tablet Commonly known as: CIPRO       TAKE these medications    abiraterone  acetate 250 MG tablet Commonly known as: ZYTIGA  Take 4 tablets (1,000 mg total) by mouth daily. Take on an empty stomach 1 hour before or 2 hours after a meal   acetaminophen  325 MG tablet Commonly known as: TYLENOL  Take 325 mg by mouth every 6 (six) hours as needed for moderate pain (pain score 4-6).   apixaban  5 MG Tabs tablet Commonly known as: Eliquis  Take 1 tablet (5 mg total) by mouth in the morning and at bedtime.   hydroxyurea  500 MG capsule Commonly known as: HYDREA  TAKE 1 CAPSULE BY MOUTH TWICE DAILY WITH FOOD TO  MINIMIZE  GI  SIDE  EFFECTS What changed: See the new instructions.   lisinopril -hydrochlorothiazide  20-12.5 MG tablet Commonly known as: ZESTORETIC  Take 2 tablets by mouth daily. What changed:  how much to take when to take this   metoprolol  tartrate 25 MG tablet Commonly known as: LOPRESSOR  Take 1 tablet (25 mg total) by mouth 2 (two) times daily.   multivitamin capsule Take 1 capsule by mouth daily with breakfast.   PARoxetine  10 MG tablet Commonly known as: PAXIL  Take 1/2 (one-half) tablet by mouth twice daily What changed:  how much to take how to take this when to take this additional instructions   predniSONE  5 MG tablet Commonly known as: DELTASONE  Take 1 tablet (5 mg total) by mouth daily with breakfast.        Follow-up Information     Rakes, Rock HERO, FNP Follow up.   Specialty: Family Medicine Contact information: [address] [place] [address] [phone]         Lavona Agent, MD Follow up.   Specialty: Cardiology        Rosemarie Eather RAMAN, MD Follow up.   Specialties: Neurology, Radiology Contact information: [address] [place] [address] [phone]                 Discharge Exam: Justin Gibbs   06/29/24 0926  Weight: 104.3 kg  Well-appearing male with subtle left sided visual field blurriness, no other focal deficits.   Condition at discharge: stable  The results of significant diagnostics from this hospitalization (including imaging, microbiology, ancillary and laboratory) are listed below for reference.   Imaging Studies: ECHOCARDIOGRAM COMPLETE Result Date: 06/30/2024    ECHOCARDIOGRAM REPORT   Patient Name:   Justin Gibbs Date of Exam: 06/30/2024 Medical Rec #:  [id number]       Height:       70.0 in Accession #:    [id number]      Weight:       230.0 lb Date of Birth:  07-19-1941        BSA:          2.215 m Patient Age:    83 years        BP:           125/78 mmHg  Patient Gender: M               HR:           105 bpm. Exam Location:  [facility] Procedure: 2D Echo, Cardiac Doppler, Color Doppler and Intracardiac            Opacification Agent (Both Spectral and Color Flow Doppler were            utilized during procedure). Indications:    Stroke 163.9  History:        Patient has prior history of Echocardiogram examinations, most                 recent 07/09/2019. Prostate cancer, Arrythmias:Atrial                 Fibrillation; Risk Factors:Hypertension, Dyslipidemia and Former                 Smoker.  Sonographer:    Merlynn Argyle Referring Phys: [phone] DAVID TAT  Sonographer Comments: Suboptimal parasternal window and Technically difficult study due to poor echo windows. Image acquisition challenging due to respiratory motion. IMPRESSIONS  1. Left ventricular ejection fraction, by estimation, is 65 to 70%. The left ventricle has normal function. The left ventricle has no regional wall motion abnormalities. Left ventricular diastolic parameters are indeterminate.  2. Right ventricular systolic function is normal. The right ventricular size is mildly enlarged. There is normal pulmonary artery systolic pressure. The estimated right ventricular  systolic pressure is 31.5 mmHg.  3. Left atrial size was mild to moderately dilated.  4. A small pericardial effusion is present. The pericardial effusion is surrounding the apex. There is no evidence of cardiac tamponade.  5. The mitral valve is degenerative. Trivial mitral valve regurgitation.  6. The aortic valve is tricuspid. There is moderate calcification of the aortic valve. Aortic valve regurgitation is mild. Aortic valve sclerosis/calcification is present, without any evidence of aortic stenosis.  7. Aortic dilatation noted. There is moderate dilatation of the ascending aorta, measuring 45 mm.  8. The inferior vena cava is normal in size with greater than 50% respiratory variability, suggesting right atrial pressure of 3 mmHg. Comparison(s): Prior images reviewed side by side. LVEF 65-70% with indeterminate diastolic function. Normal estimated RVSP. Aortic valve sclerosis without stenosis, mild aortic regurgitation. Moderately dilated ascending aorta. No interatrial shunting. FINDINGS  Left Ventricle: Left ventricular ejection fraction, by estimation, is 65 to 70%. The left ventricle has normal function. The left ventricle has no regional wall motion abnormalities. Definity  contrast agent was given IV to delineate the left ventricular  endocardial borders. The left ventricular internal cavity size was normal in size. Suboptimal image quality limits for assessment of left ventricular hypertrophy. Left ventricular diastolic parameters are indeterminate. Right Ventricle: The right ventricular size is mildly enlarged. No increase in right ventricular wall thickness. Right ventricular systolic function is normal. There is normal pulmonary artery systolic pressure. The tricuspid regurgitant velocity is 2.67  m/s, and with an assumed right atrial pressure of 3 mmHg, the estimated right ventricular systolic pressure is 31.5 mmHg. Left Atrium: Left atrial size was mild to moderately dilated. Right Atrium: Right  atrial size was normal in size. Pericardium: A small pericardial effusion is present. The pericardial effusion is surrounding the apex. There is no evidence of cardiac tamponade. Presence of epicardial fat layer. Mitral Valve: The mitral valve is degenerative in appearance. Mild mitral annular calcification. Trivial mitral valve regurgitation. Tricuspid Valve: The tricuspid valve is grossly  normal. Tricuspid valve regurgitation is trivial. Aortic Valve: The aortic valve is tricuspid. There is moderate calcification of the aortic valve. Aortic valve regurgitation is mild. Aortic valve sclerosis/calcification is present, without any evidence of aortic stenosis. Pulmonic Valve: The pulmonic valve was not well visualized. Pulmonic valve regurgitation is not visualized. Aorta: Aortic dilatation noted. There is moderate dilatation of the ascending aorta, measuring 45 mm. Venous: The inferior vena cava is normal in size with greater than 50% respiratory variability, suggesting right atrial pressure of 3 mmHg. IAS/Shunts: No atrial level shunt detected by color flow Doppler. Additional Comments: 3D was performed not requiring image post processing on an independent workstation and was indeterminate.  LEFT VENTRICLE PLAX 2D LVOT diam:     2.10 cm      Diastology LV SV:         52           LV e' medial:    2.50 cm/s LV SV Index:   23           LV E/e' medial:  32.7 LVOT Area:     3.46 cm     LV e' lateral:   11.40 cm/s                             LV E/e' lateral: 7.2  LV Volumes (MOD) LV vol d, MOD A2C: 106.9 ml LV vol d, MOD A4C: 102.0 ml LV vol s, MOD A2C: 39.8 ml LV vol s, MOD A4C: 49.0 ml LV SV MOD A2C:     67.1 ml LV SV MOD A4C:     102.0 ml LV SV MOD BP:      59.8 ml RIGHT VENTRICLE             IVC RV Basal diam:  4.00 cm     IVC diam: 1.80 cm RV Mid diam:    3.40 cm RV S prime:     14.90 cm/s TAPSE (M-mode): 1.4 cm LEFT ATRIUM             Index        RIGHT ATRIUM           Index LA Vol (A2C):   86.3 ml 38.96 ml/m   RA Area:     22.00 cm LA Vol (A4C):   86.6 ml 39.10 ml/m  RA Volume:   59.00 ml  26.64 ml/m LA Biplane Vol: 86.6 ml 39.10 ml/m  AORTIC VALVE LVOT Vmax:   73.30 cm/s LVOT Vmean:  55.300 cm/s LVOT VTI:    0.150 m  AORTA Ao Root diam: 3.40 cm Ao Asc diam:  4.50 cm MITRAL VALVE               TRICUSPID VALVE MV Area (PHT): 3.93 cm    TR Peak grad:   28.5 mmHg MV Decel Time: 193 msec    TR Vmax:        267.00 cm/s MV E velocity: 81.70 cm/s MV A velocity: 90.10 cm/s  SHUNTS MV E/A ratio:  0.91        Systemic VTI:  0.15 m                            Systemic Diam: 2.10 cm Jayson Sierras MD Electronically signed by Jayson Sierras MD Signature Date/Time: 06/30/2024/11:36:36 AM    Final    CT Angio Head Neck W  WO CM Result Date: 06/29/2024 EXAM: CT HEAD WITHOUT CTA HEAD AND NECK WITHOUT AND WITH 06/29/2024 11:37:26 AM TECHNIQUE: CTA of the head and neck was performed without and with the administration of 75 mL of iohexol  (OMNIPAQUE ) 350 MG/ML injection. Noncontrast CT of the head with reconstructed 2-D images are also provided for review. Multiplanar 2D and/or 3D reformatted images are provided for review. Automated exposure control, iterative reconstruction, and/or weight based adjustment of the mA/kV was utilized to reduce the radiation dose to as low as reasonably achievable. COMPARISON: MRI head 07/22/2024 CLINICAL HISTORY: Neuro deficit, acute, stroke suspected. Small acute right cerebral infarcts on MRI today. FINDINGS: CT HEAD: BRAIN AND VENTRICLES: Small hypodensities in the anterior right insula and adjacent right frontal white matter correspond to known acute infarcts, overall better demonstrated on today's MRI. No acute intracranial hemorrhage, mass, midline shift, hydrocephalus, or extra-axial fluid collection is identified. Patchy hypodensities in the cerebral white matter bilaterally are nonspecific but compatible with mild to moderate chronic small vessel ischemic disease. There is mild cerebral  atrophy. ORBITS: No acute abnormality. SINUSES AND MASTOIDS: No acute abnormality. CTA NECK: AORTIC ARCH AND ARCH VESSELS: Normal variant aortic arch branching pattern with common origin of the brachiocephalic and left common carotid arteries. Mixed calcified and soft plaque in the aortic arch without a significant stenosis of the arch vessel origins. Widely patent brachiocephalic and subclavian arteries. CERVICAL CAROTID ARTERIES: Small amount of mixed plaque about the right greater than left carotid bifurcations. No dissection, arterial injury, or hemodynamically significant stenosis by NASCET criteria. CERVICAL VERTEBRAL ARTERIES: Dominant left vertebral artery. No dissection, arterial injury, or significant stenosis. LUNGS AND MEDIASTINUM: Unremarkable. SOFT TISSUES: No acute abnormality. BONES: Advanced multilevel cervical disc and facet degeneration. CTA HEAD: ANTERIOR CIRCULATION: The intracranial internal carotid arteries are patent with mild to moderate atherosclerosis not resulting in a significant stenosis. ACAs and MCAs are patent proximally without evidence of a significant A1 or M1 stenosis. There is occlusion of a mid right M2 branch vessel in the superior division, corresponding to the distribution of the small acute infarcts on MRI. There is also occlusion of a mid to distal left M2 branch vessel. No aneurysm. POSTERIOR CIRCULATION: The intracranial vertebral arteries are patent to the basilar with the left being strongly dominant. Mild atherosclerotic calcification in the left V4 segment does not result in a significant stenosis. Patent PICA and SCA origins are visualized bilaterally. The basilar artery is widely patent. There are large right and small left posterior communicating arteries with hypoplasia of the right P1 segment. Both PCAs are patent without evidence of a significant proximal stenosis. No aneurysm. OTHER: No dural venous sinus thrombosis on this non-dedicated study. IMPRESSION: 1.  Occlusion of a mid right M2 branch vessel in the superior division, corresponding to the distribution of the small acute infarcts on MRI. 2. Occlusion of a mid to distal left M2 branch vessel. 3. Overall mild atherosclerosis in the head and neck without a flow-limiting proximal stenosis. Electronically signed by: Dasie Hamburg MD 06/29/2024 12:05 PM EST RP Workstation: [id number]   MR Brain Wo Contrast (neuro protocol) Result Date: 06/29/2024 EXAM: MRI Brain Without Contrast 06/29/2024 10:08:36 AM TECHNIQUE: Multiplanar multisequence MRI of the head/brain was performed without the administration of intravenous contrast. COMPARISON: None available. CLINICAL HISTORY: Neuro deficit, acute, stroke suspected Neuro deficit, acute, stroke suspected FINDINGS: BRAIN AND VENTRICLES: Multiple small acute infarcts in the right frontal lobe and anterior right insula. No intracranial hemorrhage. No mass. No midline shift. No hydrocephalus.  Moderate T2 hyperintensities, compatible with chronic microvascular ischemic change. Normal flow voids. ORBITS: No acute abnormality. SINUSES AND MASTOIDS: No acute abnormality. BONES AND SOFT TISSUES: Normal marrow signal. No acute soft tissue abnormality. IMPRESSION: 1. Multiple small acute infarcts in the right frontal lobe and anterior right insula. Electronically signed by: Gilmore Molt MD 06/29/2024 10:21 AM EST RP Workstation: [id number]    Microbiology: Results for orders placed or performed during the hospital encounter of 04/29/23  Urine Culture     Status: None   Collection Time: 04/29/23 11:55 PM   Specimen: Urine, Clean Catch  Result Value Ref Range Status   Specimen Description   Final    URINE, CLEAN CATCH Performed at [facility], [address] [place], [place] [address]    Special Requests   Final    NONE Performed at [facility], [address], [place], [place] [address]    Culture   Final    NO GROWTH Performed  at [facility], [address], [place], [place] [address]    Report Status 05/01/2023 FINAL  Final    Labs: CBC: Recent Labs  Lab 06/29/24 0932  WBC 7.4  NEUTROABS 6.1  HGB 14.4  HCT 42.0  MCV 111.7*  PLT 313   Basic Metabolic Panel: Recent Labs  Lab 06/29/24 0932 06/30/24 0449  NA 140 141  K 3.8 3.5  CL 98 101  CO2 27 32  GLUCOSE 132* 95  BUN 25* 18  CREATININE 0.97 0.86  CALCIUM 9.5 8.7*   Liver Function Tests: Recent Labs  Lab 06/29/24 0932  AST 25  ALT 13  ALKPHOS 60  BILITOT 1.0  PROT 7.3  ALBUMIN 4.6   CBG: Recent Labs  Lab 06/29/24 0929  GLUCAP 149*    Discharge time spent: greater than 30 minutes.  Signed: Bernardino KATHEE Come, MD [facility] 06/30/2024

## 2024-06-30 NOTE — Evaluation (Addendum)
 Physical Therapy Evaluation Patient Details Name: Justin Gibbs MRN: [id number] DOB: [birth year] Today's Date: 06/30/2024  History of Present Illness  Justin Gibbs is a  83 year old male with a history of polycythemia vera, prostate cancer, atrial fibrillation, hypertension, anxiety/depression, hyperlipidemia presenting with dysarthria and facial droop.  The patient was coming up to the kitchen around 8:30 AM on 06/29/2024 when his wife noted him to have some dysarthria.  She had difficulty understanding what he was trying to say.  She stated that it appeared that his left face was drawn up, and that the patient was drooling on the left.  The patient refused for EMS to be activated.  Subsequently, the patient's wife drove him to the emergency department.  Wife states that he was last known normal around 9:30 PM on 06/28/2024.  By time they arrived to emergency department, the patient's deficits have completely resolved.  Apparently, the patient has been having intermittent choking on thin liquids for several months.  He states that happens about once or twice a week.  He had another episode this morning before the above events.  He denies any visual disturbance.  He denies any focal extremity weakness.  He denies any dysesthesias.  He denies any fevers, chills, chest pain, shortness breath, cough, hemoptysis, nausea, vomiting, diarrhea, abdominal pain.  He denies any dysuria or hematuria.  There is no hematochezia or melena.  In the ED, patient was afebrile hemodynamically stable with oxygen saturation 96% room air.  WBC 7.4, hemoglobin 14.4, platelets 313.  Sodium 140, potassium 3.8, bicarbonate 27, serum creatinine 0.97.  LFTs unremarkable.  MRI of the brain was negative for any acute findings.  CTA head and neck negative for hemodynamically significant stenosis in the neck.  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  The patient was continued on apixaban .  EDP spoke with neurology, Dr.  Matthews who recommended admission for stroke workup.  He was admitted for further evaluation and treatment of his stroke.    Clinical Impression  Pt. Presented w/ mild general weakness in LE due to dx. Co-treated w/ OT. Pt tolerated session well, able to perform bed mobility independently w/ increased time to EOB, pt was able to don and doff socks at EOB w/ increased time. Pt was able to perform transfer from sit to stand w/ quad cane and Mod Independence. Pt was able to ambulate into hallway w/ quad cane and supervision, due to mild staggering during ambulation. Pt was able to self correct when needed. Towards the end of ambulation pt. was fatigued. Pt was left at EOB w/ call bell and wife at bedside. Nursing staff was notified on pt status. Patient will benefit from continued skilled physical therapy in hospital and recommended venue below to increase strength, balance, endurance for safe ADLs and gait.       If plan is discharge home, recommend the following: A little help with walking and/or transfers;Assist for transportation;A little help with bathing/dressing/bathroom   Can travel by private vehicle    Yes    Equipment Recommendations None recommended by PT  Recommendations for Other Services       Functional Status Assessment Patient has had a recent decline in their functional status and demonstrates the ability to make significant improvements in function in a reasonable and predictable amount of time.     Precautions / Restrictions Precautions Precautions: Fall Recall of Precautions/Restrictions: Intact Restrictions Weight Bearing Restrictions Per Provider Order: No  Mobility  Bed Mobility Overal bed mobility: Independent                  Transfers Overall transfer level: Modified independent Equipment used: Quad cane               General transfer comment: Mod I to supervision for transfers with quad cane.    Ambulation/Gait Ambulation/Gait  assistance: Supervision, Contact guard assist Gait Distance (Feet): 45 Feet Assistive device: Quad cane Gait Pattern/deviations: Step-through pattern, Decreased step length - right, Decreased step length - left, Decreased stance time - right, Decreased stance time - left, Staggering left       General Gait Details: Pt. was able to ambulate w/ quad cane into hallway. Pt was slowed and labored w/ a mild L stagger, pt was able to self to correct. Pt. was fatigued toward the end of session.  Stairs            Wheelchair Mobility     Tilt Bed    Modified Rankin (Stroke Patients Only)       Balance Overall balance assessment: Needs assistance Sitting-balance support: No upper extremity supported, Feet supported Sitting balance-Leahy Scale: Fair Sitting balance - Comments: fair to good at EOB   Standing balance support: Single extremity supported, During functional activity Standing balance-Leahy Scale: Fair Standing balance comment: using quad cane                             Pertinent Vitals/Pain Pain Assessment Pain Assessment: No/denies pain    Home Living Family/patient expects to be discharged to:: Private residence Living Arrangements: Spouse/significant other Available Help at Discharge: Family;Available 24 hours/day Type of Home: House Home Access: Stairs to enter Entrance Stairs-Rails: None Entrance Stairs-Number of Steps: 4   Home Layout: Laundry or work area in basement;Able to live on main level with bedroom/bathroom Home Equipment: [place] - single point;Grab bars - tub/shower      Prior Function Prior Level of Function : Independent/Modified Independent             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 ADLs Comments: Independent ADL's; wife does most IADL's.     Extremity/Trunk Assessment   Upper Extremity Assessment Upper Extremity Assessment: Defer to OT evaluation    Lower  Extremity Assessment Lower Extremity Assessment: Generalized weakness    Cervical / Trunk Assessment Cervical / Trunk Assessment: Kyphotic  Communication   Communication Communication: Impaired Factors Affecting Communication: Hearing impaired    Cognition Arousal: Alert Behavior During Therapy: WFL for tasks assessed/performed   PT - Cognitive impairments: No apparent impairments                         Following commands: Intact       Cueing Cueing Techniques: Verbal cues, Visual cues     General Comments      Exercises     Assessment/Plan    PT Assessment Patient needs continued PT services  PT Problem List Decreased strength;Decreased range of motion;Decreased activity tolerance;Decreased balance;Decreased mobility;Decreased coordination;Decreased safety awareness       PT Treatment Interventions DME instruction;Gait training;Stair training;Functional mobility training;Therapeutic activities;Therapeutic exercise;Balance training;Patient/family education    PT Goals (Current goals can be found in the Care Plan section)  Acute Rehab PT Goals Patient Stated Goal: return home PT Goal Formulation: With patient/family Time For Goal Achievement: 07/05/24 Potential to Achieve Goals:  Good    Frequency Min 3X/week     Co-evaluation PT/OT/SLP Co-Evaluation/Treatment: Yes Reason for Co-Treatment: To address functional/ADL transfers PT goals addressed during session: Mobility/safety with mobility OT goals addressed during session: ADL's and self-care       AM-PAC PT 6 Clicks Mobility  Outcome Measure Help needed turning from your back to your side while in a flat bed without using bedrails?: None Help needed moving from lying on your back to sitting on the side of a flat bed without using bedrails?: None Help needed moving to and from a bed to a chair (including a wheelchair)?: None Help needed standing up from a chair using your arms (e.g.,  wheelchair or bedside chair)?: None Help needed to walk in hospital room?: A Little Help needed climbing 3-5 steps with a railing? : A Little 6 Click Score: 22    End of Session   Activity Tolerance: Patient tolerated treatment well;Patient limited by fatigue Patient left: in bed;with call bell/phone within reach;with family/visitor present   PT Visit Diagnosis: Repeated falls (R29.6);Muscle weakness (generalized) (M62.81);History of falling (Z91.81)    Time: 1034-1100 PT Time Calculation (min) (ACUTE ONLY): 26 min   Charges:   PT Evaluation $PT Eval Moderate Complexity: 1 Mod PT Treatments $Therapeutic Activity: 23-37 mins PT General Charges $$ ACUTE PT VISIT: 1 Visit         Autumn Pruitt, SPT

## 2024-06-30 NOTE — Progress Notes (Signed)
 PT/OT recommending home health. LCSW discussed with pt who defers to wife. Pt's wife indicates they do not feel home health is necessary and decline PT/OT at this time. She is aware to contact pt's PCP if needed after d/c.    06/30/24 1109  TOC Brief Assessment  Insurance and Status Reviewed  Patient has primary care physician Yes  Home environment has been reviewed Lives with wife.  Prior level of function: Fairly independent.  Prior/Current Home Services No current home services  Social Drivers of Health Review SDOH reviewed no interventions necessary  Readmission risk has been reviewed Yes  Transition of care needs no transition of care needs at this time

## 2024-06-30 NOTE — Consult Note (Addendum)
 I connected with  Justin Gibbs on 06/30/24 by a video enabled telemedicine application and verified that I am speaking with the correct person using two identifiers.   I discussed the limitations of evaluation and management by telemedicine. The patient expressed understanding and agreed to proceed.  Location of patient: [facility] Location of physician: [facility]  Neurology Consultation Reason for Consult: Stroke Referring Physician: Dr. Alm Tat  CC: Facial droop and dysarthria  History is obtained from: Patient, wife, chart review  HPI: Justin Gibbs is a 83 y.o. male with past medical history of atrial fibrillation on Eliquis , prostate cancer s/p radiation, prior strokes, hypertension, hyperlipidemia who presented with facial droop as well as dysarthria.  Patient states he went to bed around 8:30 PM on 06/28/2024.  Yesterday morning when he woke up and went to the bathroom he noticed his face appeared droopy and his speech was slurred.  States his symptoms have since resolved.  Denies previous history of similar symptoms.  Reports taking Eliquis  5 mg twice daily.  However states secondary to radiation he has had cystitis and occasionally gets hematuria.  Therefore at times has to hold Eliquis .  However he does report taking Eliquis  for the last few days.  Last known normal: 06/28/2024 around 8:30 PM No tPA as outside window No thrombectomy as outside window Event happened at home mRS 0   ROS: All other systems reviewed and negative except as noted in the HPI.   Past Medical History:  Diagnosis Date   Acute ischemic stroke (HCC) 06/29/2024   Anxiety    Arthritis    Atrial fibrillation (HCC) 06/2020   Cancer (HCC)    prostate / removed 2006   Depression    Dysrhythmia    High triglycerides    Hypertension    Polycythemia vera, acquired (HCC)    Prostate cancer (HCC)      Family History  Problem Relation Age of Onset   Alzheimer's disease Mother     Alcohol abuse Father    Cancer Father        leukemia   Cirrhosis Father    Stomach cancer Father    Thyroid  cancer Sister    Stroke Brother    Prostate cancer Brother    Brain cancer Brother    Diabetes Daughter    Cancer Maternal Grandmother    Cancer Paternal Grandfather     Social History:  reports that he quit smoking about 42 years ago. His smoking use included cigarettes and cigars. He started smoking about 65 years ago. He has a 23 pack-year smoking history. He has never used smokeless tobacco. He reports that he does not drink alcohol and does not use drugs.   Medications Prior to Admission  Medication Sig Dispense Refill Last Dose/Taking   abiraterone  acetate (ZYTIGA ) 250 MG tablet Take 4 tablets (1,000 mg total) by mouth daily. Take on an empty stomach 1 hour before or 2 hours after a meal 360 tablet 2 06/29/2024 Morning   acetaminophen  (TYLENOL ) 325 MG tablet Take 325 mg by mouth every 6 (six) hours as needed for moderate pain (pain score 4-6).   06/28/2024 Bedtime   apixaban  (ELIQUIS ) 5 MG TABS tablet Take 1 tablet (5 mg total) by mouth in the morning and at bedtime. 60 tablet 6 06/29/2024 at  8:00 AM   hydroxyurea  (HYDREA ) 500 MG capsule TAKE 1 CAPSULE BY MOUTH TWICE DAILY WITH FOOD TO  MINIMIZE  GI  SIDE  EFFECTS (Patient  taking differently: Take 500 mg by mouth 2 (two) times daily.) 180 capsule 0 06/29/2024 Morning   lisinopril -hydrochlorothiazide  (ZESTORETIC ) 20-12.5 MG tablet Take 2 tablets by mouth daily. (Patient taking differently: Take 1 tablet by mouth 2 (two) times daily.) 180 tablet 1 06/29/2024 Morning   metoprolol  tartrate (LOPRESSOR ) 25 MG tablet Take 1 tablet (25 mg total) by mouth 2 (two) times daily. 180 tablet 1 06/29/2024 Morning   Multiple Vitamin (MULTIVITAMIN) capsule Take 1 capsule by mouth daily with breakfast.   06/29/2024 Morning   PARoxetine  (PAXIL ) 10 MG tablet Take 1/2 (one-half) tablet by mouth twice daily (Patient taking differently: Take 10 mg by mouth  daily. Take 1/2 tablet ( 5mg  ) by mouth daily) 90 tablet 1 06/29/2024 Morning   predniSONE  (DELTASONE ) 5 MG tablet Take 1 tablet (5 mg total) by mouth daily with breakfast. 90 tablet 1 06/29/2024 Morning   ciprofloxacin (CIPRO) 500 MG tablet Take 500 mg by mouth 2 (two) times daily. (Patient not taking: Reported on 06/29/2024)   Not Taking      Exam: Current vital signs: BP 125/78 (BP Location: Left Arm)   Pulse 80   Temp 98.3 F (36.8 C) (Oral)   Resp 18   Ht 5' 10 (1.778 m)   Wt 104.3 kg   SpO2 93%   BMI 33.00 kg/m  Vital signs in last 24 hours: Temp:  [98 F (36.7 C)-98.4 F (36.9 C)] 98.3 F (36.8 C) (12/03 0616) Pulse Rate:  [67-81] 80 (12/03 0616) Resp:  [18-25] 18 (12/02 2159) BP: (125-179)/(78-137) 125/78 (12/03 0616) SpO2:  [81 %-97 %] 93 % (12/03 0616)   Physical Exam  Constitutional: Appears well-developed and well-nourished.  Neuro: AO x 3, cranial nerves grossly intact, antigravity strength in all 4 extremities without drift, sensory intact to light touch, FTN intact bilaterally  NIHSS 0   I have reviewed labs in epic and the results pertinent to this consultation are: CBC:  Recent Labs  Lab 06/29/24 0932  WBC 7.4  NEUTROABS 6.1  HGB 14.4  HCT 42.0  MCV 111.7*  PLT 313    Basic Metabolic Panel:  Lab Results  Component Value Date   NA 141 06/30/2024   K 3.5 06/30/2024   CO2 32 06/30/2024   GLUCOSE 95 06/30/2024   BUN 18 06/30/2024   CREATININE 0.86 06/30/2024   CALCIUM 8.7 (L) 06/30/2024   GFRNONAA >60 06/30/2024   GFRAA 59 (L) 08/16/2020   Lipid Panel:  Lab Results  Component Value Date   LDLCALC 81 06/30/2024   HgbA1c: No results found for: HGBA1C Urine Drug Screen: No results found for: LABOPIA, COCAINSCRNUR, LABBENZ, AMPHETMU, THCU, LABBARB  Alcohol Level No results found for: ETH   I have reviewed the images obtained:  MRI brain without contrast 06/29/2024:Multiple small acute infarcts in the right frontal lobe  and anterior right insula.  CT head and neck with and without contrast 06/29/2024: Occlusion of a mid right M2 branch vessel in the superior division, corresponding to the distribution of the small acute infarcts on MRI. Occlusion of a mid to distal left M2 branch vessel. Overall mild atherosclerosis in the head and neck without a flow-limiting proximal stenosis.   ASSESSMENT/PLAN: 83 year old male with history of A-fib on Eliquis  who presented with transient dysarthria and facial droop which has since resolved.  Acute ischemic stroke - Etiology: Likely cardioembolic  Recommendations: - Patient stroke is relatively small.  He also has occlusion in right M2 which is relatively distal.  Additionally patient's  NIHSS is 0 and therefore he will not be a candidate for thrombectomy - For now would recommend continuing Eliquis  5 mg twice daily - Patient reported he occasionally has to hold Eliquis  due to hematuria secondary to his radiation for prostate cancer.  Changing anticoagulation has not shown consistent benefit in studies.  Additionally due to his occasional hematuria, I would be hesitant to make any changes to anticoagulation for now. Therefore I discussed watchman's device.  I have also messaged his cardiologist. - Goal blood pressure: Permissive hypertension today followed by gradual normotension over next 2 weeks.  Recommend avoiding hypotension as patient still has intracranial occlusion and hypoperfusion can worsen stroke - PT/OT - Discussed plan with patient and wife at bedside - Discussed plan with Dr. Bryn via secure chat - Follow-up with neurology (order placed) - Stroke education   Thank you for allowing us  to participate in the care of this patient. If you have any further questions, please contact  me or neurohospitalist.   Arlin Krebs Epile[facility] neuro[facility]ist

## 2024-06-30 NOTE — Evaluation (Signed)
 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) 11/28/2020   Prostate cancer (HCC) 11/14/2020   Polycythemia vera (HCC) 10/11/2020   Essential hypertension 07/06/2019   GAD (generalized anxiety disorder) 07/05/2019   Chronic sinusitis 07/05/2019   Paroxysmal atrial fibrillation (HCC) 07/05/2019   Mixed hyperlipidemia 07/05/2019   BMI 32.0-32.9,adult 07/05/2019   Prolonged Q-T interval on ECG 07/05/2019   Past Medical History:  Past Medical History:  Diagnosis Date   Acute ischemic stroke (HCC) 06/29/2024   Anxiety    Arthritis    Atrial fibrillation (HCC) 06/2020   Cancer (HCC)    prostate / removed 2006   Depression    Dysrhythmia    High triglycerides    Hypertension    Polycythemia vera, acquired (HCC)    Prostate cancer [facility])    Past Surgical History:  Past Surgical History:  Procedure Laterality Date   BIOPSY  08/27/2021   Procedure: BIOPSY;  Surgeon: Cindie Carlin POUR, DO;  Location: [facility];  Service: Endoscopy;;   COLONOSCOPY WITH PROPOFOL  N/A 08/27/2021   Procedure: COLONOSCOPY WITH PROPOFOL ;  Surgeon: Cindie Carlin POUR, DO;  Location: [facility];  Service: Endoscopy;  Laterality: N/A;  8:00am   HERNIA REPAIR Bilateral    x 2    POLYPECTOMY  08/27/2021   Procedure: POLYPECTOMY;  Surgeon: Cindie Carlin POUR, DO;  Location: [facility];  Service: Endoscopy;;   PROSTATECTOMY     HPI:  DOMINION KATHAN is a 83 y.o. male with past medical history of atrial fibrillation on Eliquis , prostate cancer s/p radiation, prior strokes,  hypertension, hyperlipidemia who presented with facial droop as well as dysarthria.  Patient states he went to bed around 8:30 PM on 06/28/2024.  Yesterday morning when he woke up and went to the bathroom he noticed his face appeared droopy and his speech was slurred.  States his symptoms have since resolved.  Denies previous history of similar symptoms.  Reports taking Eliquis  5 mg twice daily.  However states secondary to radiation he has had cystitis and occasionally gets hematuria.  Therefore at times has to hold Eliquis .  However he does report taking Eliquis  for the last few days. MRI brain without contrast 06/29/2024:Multiple small acute infarcts in the right frontal lobe and anterior right  insula.   Assessment / Plan / Recommendation Clinical Impression  Cognitive linguistic skills are WNL and/or baseline functioning with mild working memory and recall deficits. Pt without dysarthria or aphasia. He reports phlegm in his throat for the past year, but otherwise no dysphagia. His wife was present for the evaluation and both are eager to head home. He is independent with medication and financial management. No further SLP services indicated at this time. SLP will sign off.    SLP Assessment  SLP Recommendation/Assessment: Patient does not need any further Speech Language Pathology Services SLP Visit Diagnosis: Cognitive communication deficit (R41.841)     Assistance Recommended at Discharge  None  Functional Status Assessment Patient has not had a recent decline in their functional status  Frequency and Duration           SLP Evaluation Cognition  Overall Cognitive Status: Within Functional Limits for tasks assessed Arousal/Alertness: Awake/alert Orientation Level: Oriented X4 Year: 2025 Month: December Day of Week: Correct Memory: Impaired Memory Impairment: Retrieval deficit Awareness: Appears intact Problem Solving: Appears intact Safety/Judgment: Appears intact        Comprehension  Auditory Comprehension Overall Auditory Comprehension: Appears within functional limits for tasks assessed Yes/No Questions: Within Functional Limits Commands: Within Functional Limits Conversation: Complex Interfering Components: Working Radio Broadcast Assistant: Repetition Counsellor: Within Function Limits Reading Comprehension Reading Status: Within funtional limits    Expression Expression Primary Mode of Expression: Verbal Verbal Expression Overall Verbal Expression: Appears within functional limits for tasks assessed Initiation: No impairment Automatic Speech: Name;Social Response Level of Generative/Spontaneous Verbalization: Conversation Repetition: No impairment Naming: No impairment Pragmatics: No impairment Non-Verbal Means of Communication: Not applicable Written Expression Dominant Hand: Right Written Expression: Not tested   Oral / Motor  Oral Motor/Sensory Function Overall Oral Motor/Sensory Function: Within functional limits Motor Speech Overall Motor Speech: Appears within functional limits for tasks assessed Respiration: Within functional limits Phonation: Normal Resonance: Within functional limits Articulation: Within functional limitis Intelligibility: Intelligible Motor Planning: Within functional limits Motor Speech Errors: Not applicable           Thank you,  Lamar Candy, CCC-SLP [phone]  Dezirea Mccollister 06/30/2024, 2:12 PM

## 2024-07-01 ENCOUNTER — Telehealth: Payer: Self-pay

## 2024-07-01 ENCOUNTER — Other Ambulatory Visit: Payer: Self-pay | Admitting: Nurse Practitioner

## 2024-07-01 ENCOUNTER — Ambulatory Visit: Payer: Self-pay | Admitting: Family Medicine

## 2024-07-01 DIAGNOSIS — I10 Essential (primary) hypertension: Secondary | ICD-10-CM

## 2024-07-01 LAB — HEMOGLOBIN A1C
Hgb A1c MFr Bld: 5.3 % (ref 4.8–5.6)
Mean Plasma Glucose: 105 mg/dL

## 2024-07-01 NOTE — Telephone Encounter (Signed)
-----   Message from Lynwood Schilling sent at 06/30/2024  8:27 PM EST ----- Regarding: RE: watchman's device Please make a follow up appt with me. ----- Message ----- From: Shelton Arlin KIDD, MD Sent: 06/30/2024  11:41 AM EST To: Lynwood Schilling, MD Subject: watchman's device                              Good morning Dr.Hochrein,  I saw Mr. Haywood today for an acute ischemic stroke.  He told me that due to radiation secondary to his prostate cancer, he occasionally gets hematuria and has to hold Eliquis  for some time.  His stroke is relatively small but does look embolic.  Therefore I was wondering if he would be a good candidate for watchman's device.  He told me you have discussed this with him in the past and he is interested in knowing more about it now that he has had a small stroke.  Just wanted to let you know so you could discuss it during his follow-up visit.  Thank you so much  Priyanka

## 2024-07-01 NOTE — Telephone Encounter (Signed)
 Called pt. Unable to leave message. Line busy 12/4

## 2024-07-05 ENCOUNTER — Telehealth: Payer: Self-pay

## 2024-07-05 NOTE — Telephone Encounter (Signed)
 Spoke with pt regarding a follow up appt. Pt preferred to see Dr. Lavona in [place]. Pt scheduled for 2/25.

## 2024-07-05 NOTE — Telephone Encounter (Signed)
-----   Message from Lynwood Schilling sent at 06/30/2024  8:27 PM EST ----- Regarding: RE: watchman's device Please make a follow up appt with me. ----- Message ----- From: Shelton Arlin KIDD, MD Sent: 06/30/2024  11:41 AM EST To: Lynwood Schilling, MD Subject: watchman's device                              Good morning Dr.Hochrein,  I saw Mr. Haywood today for an acute ischemic stroke.  He told me that due to radiation secondary to his prostate cancer, he occasionally gets hematuria and has to hold Eliquis  for some time.  His stroke is relatively small but does look embolic.  Therefore I was wondering if he would be a good candidate for watchman's device.  He told me you have discussed this with him in the past and he is interested in knowing more about it now that he has had a small stroke.  Just wanted to let you know so you could discuss it during his follow-up visit.  Thank you so much  Priyanka

## 2024-07-06 NOTE — Telephone Encounter (Signed)
 Patient called back wanted to come in sooner. Please advise

## 2024-07-07 ENCOUNTER — Other Ambulatory Visit: Payer: Self-pay

## 2024-07-07 NOTE — Telephone Encounter (Signed)
 Pt returning call. Please advise.

## 2024-07-07 NOTE — Telephone Encounter (Signed)
 Left voice message to call back to get scheduled at 1:40 pm on 12/16 with Dr. Lavona

## 2024-07-08 ENCOUNTER — Other Ambulatory Visit (HOSPITAL_COMMUNITY): Payer: Self-pay

## 2024-07-08 NOTE — Telephone Encounter (Signed)
 Pt scheduled to see Hochrein 12/16

## 2024-07-11 NOTE — Progress Notes (Unsigned)
 Cardiology Office Note:   Date:  07/13/2024  ID:  Justin Gibbs, DOB [birth year], MRN [id number] PCP: Severa Rock HERO, FNP  [facility] Providers Cardiologist:  Lynwood Schilling, MD {  History of Present Illness:   Justin Gibbs is a 83 y.o. male who is referred for evaluation of atrial fib.  Since he was last seen he has had a lot of problems with his bladder.  I saw an ER visit in early October.  He had some hematuria.  Eliquis  was stopped and he had a Foley placed.  He said he went home with this for almost a month and had continued hematuria the entire time.  He went to see urology and they took that out at the end of October 2024.  He had a scope eventually by Dr. Watt and he was told that there was a bleeding vessel and he was offered cauterization.  This was not urgent and he did not have this done and he did have resolution of his hematuria and he was since been restarted on his Eliquis .  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  He is back to discuss Watchman.   He had occlusion of the right M2.  He been off his Eliquis  for a month because of hematuria.  He said this has completely resolved and since then he has had no further episodes of hematuria.  He is not having any more bladder radiation.  He is doing much better.  He gets around very slowly with a cane.  He has severe degenerative joint problems.  He has chronic dyspnea with exertion but this is unchanged from previous.  He does not describe PND or orthopnea.  Said no chest pressure, neck or arm discomfort.  He said no weight gain has some chronic lower extremity swelling.  ROS: Tingling in his hands and feet, joint problems. Otherwise as stated in the HPI and negative for all other systems.  Studies Reviewed:    EKG:    NA    Risk Assessment/Calculations:     Mr. [name] has a CHA2DS2 - VASc score of 5.        Physical Exam:   VS:  BP (!) 157/99 (BP Location: Right  Arm)   Pulse 74   Ht 5' 10 (1.778 m)   Wt 223 lb (101.2 kg)   SpO2 95%   BMI 32.00 kg/m    Wt Readings from Last 3 Encounters:  07/13/24 223 lb (101.2 kg)  06/29/24 230 lb (104.3 kg)  06/02/24 227 lb (103 kg)     GEN: Well nourished, well developed in no acute distress NECK: No JVD; No carotid bruits CARDIAC: Irregular RR, no murmurs, rubs, gallops RESPIRATORY:  Clear to auscultation without rales, wheezing or rhonchi  ABDOMEN: Soft, non-tender, non-distended EXTREMITIES:  MIld bilateral leg edema; No deformity   ASSESSMENT AND PLAN:   Atrial fib: Patient tolerates atrial fibrillation with good rate control.  He is back on his Eliquis  and tolerating this.  We discussed Watchman but at this point he wants to not pursue this and would rather try to continue his Eliquis  and let me know if he has any further hematuria.  He understands that I do not want him missing any doses or holding it without consulting us  for any reason.   HTN:    His blood pressure is elevated but labile.  I am going to have him keep  a blood pressure diary.   AKI:   Creatinine was 0.8 6.  No change in therapy.    AI: Follow-up echo did not suggest any significant valvular disease.  No change in therapy.  This shortness of breath: Has been chronic.  He has very limited mobility because of his joint problems.  He has significant weight issues.  I do not think this is an anginal equivalent.  He sats normally when he short of breath.  No change in therapy.   Follow up with me in six months in [place].   Signed, Lynwood Schilling, MD

## 2024-07-12 ENCOUNTER — Other Ambulatory Visit: Payer: Self-pay

## 2024-07-12 ENCOUNTER — Other Ambulatory Visit: Payer: Self-pay | Admitting: Pharmacy Technician

## 2024-07-12 NOTE — Progress Notes (Signed)
 Specialty Pharmacy Refill Coordination Note  Justin Gibbs is a 83 y.o. male contacted today regarding refills of specialty medication(s) Abiraterone  Acetate (ZYTIGA )   Patient requested Delivery   Delivery date: 07/16/24   Verified address: [address] [place]   Medication will be filled on: 07/15/24

## 2024-07-13 ENCOUNTER — Ambulatory Visit: Attending: Cardiology | Admitting: Cardiology

## 2024-07-13 ENCOUNTER — Encounter: Payer: Self-pay | Admitting: Cardiology

## 2024-07-13 VITALS — BP 157/99 | HR 74 | Ht 70.0 in | Wt 223.0 lb

## 2024-07-13 DIAGNOSIS — I351 Nonrheumatic aortic (valve) insufficiency: Secondary | ICD-10-CM | POA: Diagnosis not present

## 2024-07-13 DIAGNOSIS — I10 Essential (primary) hypertension: Secondary | ICD-10-CM

## 2024-07-13 DIAGNOSIS — D6869 Other thrombophilia: Secondary | ICD-10-CM

## 2024-07-13 DIAGNOSIS — R0602 Shortness of breath: Secondary | ICD-10-CM | POA: Diagnosis not present

## 2024-07-13 DIAGNOSIS — I480 Paroxysmal atrial fibrillation: Secondary | ICD-10-CM

## 2024-07-13 NOTE — Patient Instructions (Signed)

## 2024-07-15 ENCOUNTER — Other Ambulatory Visit: Payer: Self-pay

## 2024-08-03 ENCOUNTER — Encounter: Payer: Self-pay | Admitting: Hematology and Oncology

## 2024-08-03 ENCOUNTER — Telehealth: Payer: Self-pay

## 2024-08-03 ENCOUNTER — Other Ambulatory Visit (HOSPITAL_COMMUNITY): Payer: Self-pay

## 2024-08-03 NOTE — Telephone Encounter (Signed)
 Oral Oncology Patient Advocate Encounter  Was successful in securing patient a $6000 grant from [facility] to provide copayment coverage for zytiga .  This will keep the out of pocket expense at $0.     [employer] ID: [id number]   The billing information is as follows and has been shared with [facility].    RxBin: [id number] PCN: [id number] Member ID: [id number] Group ID: [id number] Dates of Eligibility: 07/04/24 through 07/03/25  Fund:  [employer]  Lucie Lamer, CPhT [place]  [facility] Special[facility] Pharm[facility] Oncology Pharmacy Patient Advocate Specialist II THERES[place] Phone: [phone]  Fax: [phone] [email or web address]

## 2024-08-06 ENCOUNTER — Other Ambulatory Visit (HOSPITAL_COMMUNITY): Payer: Self-pay

## 2024-08-09 ENCOUNTER — Other Ambulatory Visit: Payer: Self-pay

## 2024-08-09 ENCOUNTER — Other Ambulatory Visit (HOSPITAL_COMMUNITY): Payer: Self-pay

## 2024-08-09 NOTE — Progress Notes (Signed)
 Specialty Pharmacy Refill Coordination Note  MyChart Questionnaire Submission  Justin Gibbs is a 84 y.o. male contacted today regarding refills of specialty medication(s) Zytiga .  Doses on hand: 18 (72 tabs)   Patient requested: Delivery  Delivery date: 08/17/24  Verified address: [address] [place] [address]  Medication will be filled on 08/16/24

## 2024-08-24 ENCOUNTER — Telehealth: Payer: Self-pay | Admitting: Hematology and Oncology

## 2024-08-24 NOTE — Telephone Encounter (Signed)
 Pt called to reschedule appt due to not being able to make it

## 2024-08-25 ENCOUNTER — Inpatient Hospital Stay

## 2024-08-25 ENCOUNTER — Inpatient Hospital Stay: Admitting: Hematology and Oncology

## 2024-08-31 ENCOUNTER — Ambulatory Visit: Payer: Self-pay | Admitting: Family Medicine

## 2024-09-02 ENCOUNTER — Other Ambulatory Visit (HOSPITAL_COMMUNITY): Payer: Self-pay

## 2024-09-02 ENCOUNTER — Other Ambulatory Visit: Payer: Self-pay | Admitting: Hematology and Oncology

## 2024-09-02 DIAGNOSIS — C61 Malignant neoplasm of prostate: Secondary | ICD-10-CM

## 2024-09-03 ENCOUNTER — Other Ambulatory Visit: Payer: Self-pay

## 2024-09-03 ENCOUNTER — Other Ambulatory Visit: Payer: Self-pay | Admitting: Pharmacist

## 2024-09-03 MED ORDER — ABIRATERONE ACETATE 250 MG PO TABS: 1000.0000 mg | ORAL_TABLET | Freq: Every day | ORAL | 2 refills | Status: AC

## 2024-09-03 NOTE — Progress Notes (Signed)
 Specialty Pharmacy Ongoing Clinical Assessment Note  KHOLTON COATE is a 84 y.o. male who is being followed by the specialty pharmacy service for RxSp Oncology   Patient's specialty medication(s) reviewed today: Abiraterone  Acetate (ZYTIGA )   Missed doses in the last 4 weeks: 0   Patient/Caregiver did not have any additional questions or concerns.   Therapeutic benefit summary: Patient is achieving benefit   Adverse events/side effects summary: No adverse events/side effects   Patient's therapy is appropriate to: Continue    Goals Addressed             This Visit's Progress    Slow Disease Progression   On track    Patient is on track. Patient will maintain adherence. PSA remains at 0.1 ng/mL as of 12/10/23.          Follow up: 6 months  Lyle LELON Chalk Specialty PharmacistSpecialty Pharmacy Ongoing Clinical Assessment Note  RIVER AMBROSIO is a 84 y.o. male who is being followed by the specialty pharmacy service for RxSp Oncology   Patient's specialty medication(s) reviewed today: Abiraterone  Acetate (ZYTIGA )   Missed doses in the last 4 weeks: 0   Patient/Caregiver did not have any additional questions or concerns.   Therapeutic benefit summary: Patient is achieving benefit   Adverse events/side effects summary: No adverse events/side effects   Patient's therapy is appropriate to: Continue    Goals Addressed             This Visit's Progress    Slow Disease Progression   On track    Patient is on track. Patient will maintain adherence. PSA remains at 0.1 ng/mL as of 12/10/23.          Follow up: 3 months  Lyle LELON Chalk Specialty Pharmacist

## 2024-09-09 ENCOUNTER — Inpatient Hospital Stay: Admitting: Hematology and Oncology

## 2024-09-09 ENCOUNTER — Inpatient Hospital Stay: Attending: Hematology and Oncology

## 2024-09-09 ENCOUNTER — Inpatient Hospital Stay

## 2024-09-22 ENCOUNTER — Ambulatory Visit: Admitting: Cardiology

## 2024-11-17 ENCOUNTER — Inpatient Hospital Stay: Attending: Hematology and Oncology

## 2024-11-17 ENCOUNTER — Inpatient Hospital Stay

## 2024-11-17 ENCOUNTER — Inpatient Hospital Stay: Admitting: Hematology and Oncology

## 2025-02-08 ENCOUNTER — Ambulatory Visit: Payer: Self-pay
